# Patient Record
Sex: Male | Born: 1970
Health system: Southern US, Community
[De-identification: ages and names within clinical notes are randomized; demographics above are authoritative.]

## PROBLEM LIST (undated history)

## (undated) DIAGNOSIS — N289 Disorder of kidney and ureter, unspecified: Secondary | ICD-10-CM

## (undated) DIAGNOSIS — I10 Essential (primary) hypertension: Secondary | ICD-10-CM

## (undated) DIAGNOSIS — G5711 Meralgia paresthetica, right lower limb: Secondary | ICD-10-CM

## (undated) DIAGNOSIS — I509 Heart failure, unspecified: Secondary | ICD-10-CM

## (undated) HISTORY — PX: NO PAST SURGERIES: SHX2092

## (undated) HISTORY — DX: Meralgia paresthetica, right lower limb: G57.11

---

## 2016-01-17 ENCOUNTER — Encounter (HOSPITAL_COMMUNITY): Payer: Self-pay | Admitting: Emergency Medicine

## 2016-01-17 ENCOUNTER — Emergency Department (HOSPITAL_COMMUNITY): Payer: Medicaid Other

## 2016-01-17 ENCOUNTER — Inpatient Hospital Stay (HOSPITAL_COMMUNITY)
Admission: EM | Admit: 2016-01-17 | Discharge: 2016-01-22 | DRG: 291 | Disposition: A | Discharge status: Home / Self Care | Payer: Medicaid Other | Attending: Internal Medicine | Admitting: Internal Medicine

## 2016-01-17 DIAGNOSIS — Z87891 Personal history of nicotine dependence: Secondary | ICD-10-CM | POA: Diagnosis not present

## 2016-01-17 DIAGNOSIS — F1910 Other psychoactive substance abuse, uncomplicated: Secondary | ICD-10-CM | POA: Diagnosis not present

## 2016-01-17 DIAGNOSIS — I255 Ischemic cardiomyopathy: Secondary | ICD-10-CM | POA: Diagnosis not present

## 2016-01-17 DIAGNOSIS — I132 Hypertensive heart and chronic kidney disease with heart failure and with stage 5 chronic kidney disease, or end stage renal disease: Secondary | ICD-10-CM | POA: Diagnosis present

## 2016-01-17 DIAGNOSIS — Z6841 Body Mass Index (BMI) 40.0 and over, adult: Secondary | ICD-10-CM | POA: Diagnosis not present

## 2016-01-17 DIAGNOSIS — I5040 Unspecified combined systolic (congestive) and diastolic (congestive) heart failure: Secondary | ICD-10-CM | POA: Diagnosis present

## 2016-01-17 DIAGNOSIS — I5023 Acute on chronic systolic (congestive) heart failure: Secondary | ICD-10-CM | POA: Diagnosis not present

## 2016-01-17 DIAGNOSIS — N185 Chronic kidney disease, stage 5: Secondary | ICD-10-CM | POA: Diagnosis present

## 2016-01-17 DIAGNOSIS — Z66 Do not resuscitate: Secondary | ICD-10-CM | POA: Diagnosis present

## 2016-01-17 DIAGNOSIS — I5043 Acute on chronic combined systolic (congestive) and diastolic (congestive) heart failure: Secondary | ICD-10-CM | POA: Diagnosis present

## 2016-01-17 DIAGNOSIS — E669 Obesity, unspecified: Secondary | ICD-10-CM | POA: Diagnosis present

## 2016-01-17 DIAGNOSIS — Z515 Encounter for palliative care: Secondary | ICD-10-CM | POA: Diagnosis present

## 2016-01-17 DIAGNOSIS — Z720 Tobacco use: Secondary | ICD-10-CM

## 2016-01-17 DIAGNOSIS — D631 Anemia in chronic kidney disease: Secondary | ICD-10-CM | POA: Diagnosis present

## 2016-01-17 DIAGNOSIS — N184 Chronic kidney disease, stage 4 (severe): Secondary | ICD-10-CM | POA: Diagnosis not present

## 2016-01-17 DIAGNOSIS — N179 Acute kidney failure, unspecified: Secondary | ICD-10-CM

## 2016-01-17 DIAGNOSIS — Z789 Other specified health status: Secondary | ICD-10-CM | POA: Diagnosis not present

## 2016-01-17 DIAGNOSIS — I160 Hypertensive urgency: Secondary | ICD-10-CM | POA: Diagnosis present

## 2016-01-17 DIAGNOSIS — D509 Iron deficiency anemia, unspecified: Secondary | ICD-10-CM | POA: Diagnosis present

## 2016-01-17 DIAGNOSIS — I429 Cardiomyopathy, unspecified: Secondary | ICD-10-CM

## 2016-01-17 DIAGNOSIS — Z7189 Other specified counseling: Secondary | ICD-10-CM | POA: Diagnosis not present

## 2016-01-17 DIAGNOSIS — Z9119 Patient's noncompliance with other medical treatment and regimen: Secondary | ICD-10-CM

## 2016-01-17 DIAGNOSIS — N19 Unspecified kidney failure: Secondary | ICD-10-CM

## 2016-01-17 DIAGNOSIS — R601 Generalized edema: Secondary | ICD-10-CM | POA: Diagnosis present

## 2016-01-17 DIAGNOSIS — J45909 Unspecified asthma, uncomplicated: Secondary | ICD-10-CM | POA: Diagnosis present

## 2016-01-17 DIAGNOSIS — Z91199 Patient's noncompliance with other medical treatment and regimen due to unspecified reason: Secondary | ICD-10-CM

## 2016-01-17 HISTORY — DX: Disorder of kidney and ureter, unspecified: N28.9

## 2016-01-17 HISTORY — DX: Essential (primary) hypertension: I10

## 2016-01-17 HISTORY — DX: Heart failure, unspecified: I50.9

## 2016-01-17 LAB — SODIUM, URINE, RANDOM: Sodium, Ur: 80 mmol/L

## 2016-01-17 LAB — CBC
HCT: 30.8 % — ABNORMAL LOW (ref 39.0–52.0)
Hemoglobin: 8.8 g/dL — ABNORMAL LOW (ref 13.0–17.0)
MCH: 20.8 pg — ABNORMAL LOW (ref 26.0–34.0)
MCHC: 28.6 g/dL — AB (ref 30.0–36.0)
MCV: 72.6 fL — ABNORMAL LOW (ref 78.0–100.0)
Platelets: 243 10*3/uL (ref 150–400)
RBC: 4.24 MIL/uL (ref 4.22–5.81)
RDW: 18.6 % — AB (ref 11.5–15.5)
WBC: 6.8 10*3/uL (ref 4.0–10.5)

## 2016-01-17 LAB — BASIC METABOLIC PANEL
ANION GAP: 10 (ref 5–15)
BUN: 86 mg/dL — AB (ref 6–20)
CALCIUM: 8.5 mg/dL — AB (ref 8.9–10.3)
CO2: 19 mmol/L — ABNORMAL LOW (ref 22–32)
Chloride: 112 mmol/L — ABNORMAL HIGH (ref 101–111)
Creatinine, Ser: 11.66 mg/dL — ABNORMAL HIGH (ref 0.61–1.24)
GFR calc Af Amer: 5 mL/min — ABNORMAL LOW (ref >60)
GFR, EST NON AFRICAN AMERICAN: 5 mL/min — AB (ref >60)
GLUCOSE: 108 mg/dL — AB (ref 65–99)
Potassium: 4.2 mmol/L (ref 3.5–5.1)
SODIUM: 141 mmol/L (ref 135–145)

## 2016-01-17 LAB — IRON AND TIBC
IRON: 20 ug/dL — AB (ref 45–182)
SATURATION RATIOS: 6 % — AB (ref 17.9–39.5)
TIBC: 328 ug/dL (ref 250–450)
UIBC: 308 ug/dL

## 2016-01-17 LAB — URINE MICROSCOPIC-ADD ON: Squamous Epithelial / LPF: NONE SEEN

## 2016-01-17 LAB — FERRITIN: FERRITIN: 35 ng/mL (ref 24–336)

## 2016-01-17 LAB — URINALYSIS, ROUTINE W REFLEX MICROSCOPIC
Bilirubin Urine: NEGATIVE
GLUCOSE, UA: NEGATIVE mg/dL
Hgb urine dipstick: NEGATIVE
KETONES UR: NEGATIVE mg/dL
LEUKOCYTES UA: NEGATIVE
Nitrite: NEGATIVE
PH: 5.5 (ref 5.0–8.0)
Protein, ur: 100 mg/dL — AB
SPECIFIC GRAVITY, URINE: 1.01 (ref 1.005–1.030)

## 2016-01-17 LAB — I-STAT TROPONIN, ED: TROPONIN I, POC: 0.03 ng/mL (ref 0.00–0.08)

## 2016-01-17 LAB — RAPID URINE DRUG SCREEN, HOSP PERFORMED
Amphetamines: NOT DETECTED
BARBITURATES: NOT DETECTED
Benzodiazepines: NOT DETECTED
Cocaine: NOT DETECTED
Opiates: NOT DETECTED
Tetrahydrocannabinol: NOT DETECTED

## 2016-01-17 LAB — TROPONIN I
TROPONIN I: 0.05 ng/mL — AB (ref <0.03)
Troponin I: 0.05 ng/mL (ref <0.03)

## 2016-01-17 LAB — ETHANOL

## 2016-01-17 LAB — CREATININE, URINE, RANDOM: Creatinine, Urine: 87.51 mg/dL

## 2016-01-17 LAB — BRAIN NATRIURETIC PEPTIDE: B NATRIURETIC PEPTIDE 5: 2487.1 pg/mL — AB (ref 0.0–100.0)

## 2016-01-17 LAB — TSH: TSH: 3.216 u[IU]/mL (ref 0.350–4.500)

## 2016-01-17 MED ORDER — HEPARIN SODIUM (PORCINE) 5000 UNIT/ML IJ SOLN
5000.0000 [IU] | Freq: Three times a day (TID) | INTRAMUSCULAR | Status: DC
  Administered 2016-01-17 – 2016-01-22 (×15): 5000 [IU] via SUBCUTANEOUS
  Filled 2016-01-17 (×16): qty 1

## 2016-01-17 MED ORDER — FUROSEMIDE 10 MG/ML IJ SOLN
60.0000 mg | Freq: Once | INTRAMUSCULAR | Status: AC
  Administered 2016-01-17: 60 mg via INTRAVENOUS
  Filled 2016-01-17: qty 6

## 2016-01-17 MED ORDER — SODIUM CHLORIDE 0.9% FLUSH
3.0000 mL | Freq: Two times a day (BID) | INTRAVENOUS | Status: DC
  Administered 2016-01-17 – 2016-01-22 (×10): 3 mL via INTRAVENOUS

## 2016-01-17 MED ORDER — ACETAMINOPHEN 325 MG PO TABS: 650.0000 mg | ORAL_TABLET | ORAL | Status: DC | PRN

## 2016-01-17 MED ORDER — HYDRALAZINE HCL 20 MG/ML IJ SOLN
5.0000 mg | Freq: Four times a day (QID) | INTRAMUSCULAR | Status: DC | PRN
  Administered 2016-01-17 – 2016-01-20 (×5): 5 mg via INTRAVENOUS
  Filled 2016-01-17 (×7): qty 1

## 2016-01-17 MED ORDER — SODIUM CHLORIDE 0.9 % IV SOLN: 250.0000 mL | INTRAVENOUS | Status: DC | PRN

## 2016-01-17 MED ORDER — METOPROLOL TARTRATE 5 MG/5ML IV SOLN
5.0000 mg | Freq: Four times a day (QID) | INTRAVENOUS | Status: DC
  Administered 2016-01-17 – 2016-01-20 (×12): 5 mg via INTRAVENOUS
  Filled 2016-01-17 (×12): qty 5

## 2016-01-17 MED ORDER — ASPIRIN EC 81 MG PO TBEC
81.0000 mg | DELAYED_RELEASE_TABLET | Freq: Every day | ORAL | Status: DC
  Administered 2016-01-17 – 2016-01-22 (×6): 81 mg via ORAL
  Filled 2016-01-17 (×6): qty 1

## 2016-01-17 MED ORDER — ONDANSETRON HCL 4 MG/2ML IJ SOLN
4.0000 mg | Freq: Four times a day (QID) | INTRAMUSCULAR | Status: DC | PRN
Start: 2016-01-17 — End: 2016-01-22

## 2016-01-17 MED ORDER — SODIUM CHLORIDE 0.9% FLUSH
3.0000 mL | INTRAVENOUS | Status: DC | PRN
  Administered 2016-01-20: 3 mL via INTRAVENOUS
  Filled 2016-01-17: qty 3

## 2016-01-17 MED ORDER — FUROSEMIDE 10 MG/ML IJ SOLN
15.0000 mg/h | INTRAVENOUS | Status: DC
  Administered 2016-01-17 – 2016-01-18 (×2): 15 mg/h via INTRAVENOUS
  Filled 2016-01-17 (×4): qty 25

## 2016-01-17 NOTE — ED Provider Notes (Signed)
Emergency Department Provider Note   I have reviewed the triage vital signs and the nursing notes.   HISTORY  Chief Complaint Shortness of Breath; Leg Swelling; and Congestive Heart Failure   HPI Cameron [name] is a 45 y.o. male with PMH of CHF, unknown type, presents to the emergency department for evaluation of total body swelling including the legs, abdomen, groin. The patient is from out of town and does not have his health records with him. He cannot recall the type of congestive heart failure that he has. He is unsure of his medications at home and attempted to call his wife but could not reach her. He believes that he does take something for fluid but denies swelling to this extent in the past. He denies any chest pain or difficulty breathing. He denies fever or chills. The above symptoms have been progressively worsening over the past 7 days. He denies any exacerbating or alleviating factors.   Past Medical History:  Diagnosis Date  . CHF (congestive heart failure) ([place])   . Hypertension   . Renal disorder     There are no active problems to display for this patient.   History reviewed. No pertinent surgical history.    Allergies Review of patient's allergies indicates no known allergies.  History reviewed. No pertinent family history.  Social History Social History  Substance Use Topics  . Smoking status: Former Smoker    Packs/day: 0.10    Years: 5.00    Types: Cigarettes  . Smokeless tobacco: Never Used  . Alcohol use No    Review of Systems  Constitutional: No fever/chills Eyes: No visual changes. ENT: No sore throat. Cardiovascular: Denies chest pain. Total body swelling.  Respiratory: Denies shortness of breath. Gastrointestinal: No abdominal pain.  No nausea, no vomiting.  No diarrhea.  No constipation. Genitourinary: Negative for dysuria. Musculoskeletal: Negative for back pain. Skin: Negative for rash. Neurological: Negative for headaches,  focal weakness or numbness.  10-point ROS otherwise negative.  ____________________________________________   PHYSICAL EXAM:  VITAL SIGNS: ED Triage Vitals  Enc Vitals Group     BP 01/17/16 0810 (!) 181/106     Pulse Rate 01/17/16 0810 91     Resp 01/17/16 0810 (!) 28     Temp 01/17/16 0810 98.3 F (36.8 C)     Temp Source 01/17/16 0810 Oral     SpO2 01/17/16 0810 96 %     Weight 01/17/16 0844 227 lb 6.4 oz (103.1 kg)     Height 01/17/16 0810 5\' 3"  (1.6 m)     Pain Score 01/17/16 0810 0   Constitutional: Alert and oriented.  Eyes: Conjunctivae are normal.  Head: Atraumatic. Nose: No congestion/rhinnorhea. Mouth/Throat: Mucous membranes are moist.  Oropharynx non-erythematous. Neck: No stridor. Cardiovascular: Normal rate, regular rhythm. Good peripheral circulation. Grossly normal heart sounds.   Respiratory: Normal respiratory effort.  No retractions. Lungs with diminished sounds at the bases.  Gastrointestinal: Soft but distended with no rebound or guarding.  Genitourinary: Scrotal edema noted.  Musculoskeletal: 3+ bilateral LE edema. No gross deformities of extremities. Neurologic:  Normal speech and language. No gross focal neurologic deficits are appreciated.  Skin:  Skin is warm, dry and intact. No rash noted. Psychiatric: Mood and affect are normal. Speech and behavior are normal.  ____________________________________________   LABS (all labs ordered are listed, but only abnormal results are displayed)  Labs Reviewed  BASIC METABOLIC PANEL - Abnormal; Notable for the following:       Result  Value   Chloride 112 (*)    CO2 19 (*)    Glucose, Bld 108 (*)    BUN 86 (*)    Creatinine, Ser 11.66 (*)    Calcium 8.5 (*)    GFR calc non Af Amer 5 (*)    GFR calc Af Amer 5 (*)    All other components within normal limits  CBC - Abnormal; Notable for the following:    Hemoglobin 8.8 (*)    HCT 30.8 (*)    MCV 72.6 (*)    MCH 20.8 (*)    MCHC 28.6 (*)    RDW  18.6 (*)    All other components within normal limits  BRAIN NATRIURETIC PEPTIDE - Abnormal; Notable for the following:    B Natriuretic Peptide 2,487.1 (*)    All other components within normal limits  TROPONIN I - Abnormal; Notable for the following:    Troponin I 0.05 (*)    All other components within normal limits  TROPONIN I - Abnormal; Notable for the following:    Troponin I 0.05 (*)    All other components within normal limits  URINALYSIS, ROUTINE W REFLEX MICROSCOPIC (NOT AT [facility]) - Abnormal; Notable for the following:    Protein, ur 100 (*)    All other components within normal limits  IRON AND TIBC - Abnormal; Notable for the following:    Iron 20 (*)    Saturation Ratios 6 (*)    All other components within normal limits  URINE MICROSCOPIC-ADD ON - Abnormal; Notable for the following:    Bacteria, UA RARE (*)    All other components within normal limits  URINE RAPID DRUG SCREEN, HOSP PERFORMED  ETHANOL  TSH  CREATININE, URINE, RANDOM  SODIUM, URINE, RANDOM  FERRITIN  TROPONIN I  UREA NITROGEN, URINE  BASIC METABOLIC PANEL  MAGNESIUM  I-STAT TROPOININ, ED   ____________________________________________  EKG   EKG Interpretation  Date/Time:  Friday January 17 2016 08:14:56 EDT Ventricular Rate:  88 PR Interval:    QRS Duration: 46 QT Interval:  451 QTC Calculation: 546 R Axis:   20 Text Interpretation:  Sinus rhythm Short PR interval Biatrial enlargement Posterior infarct, acute (LCx) Lateral infarct, acute Borderline ST elevation, inferior leads Prolonged QT interval No STEMI. No prior tracings.  Confirmed by Lannie Heaps MD, Izetta Sakamoto [phone]) on 01/17/2016 8:48:07 AM       ____________________________________________  RADIOLOGY  Dg Chest Portable 1 View  Result Date: 01/17/2016 CLINICAL DATA:  Shortness of breath, epigastric pain. EXAM: PORTABLE CHEST 1 VIEW COMPARISON:  None. FINDINGS: Mild cardiomegaly. Low lung volumes. No edema or effusions. Left  retrocardiac density, atelectasis or infiltrate. IMPRESSION: Cardiomegaly. Left lower lobe atelectasis or infiltrate. Electronically Signed   By: Rolm Baptise M.D.   On: 01/17/2016 09:36    ____________________________________________   PROCEDURES  Procedure(s) performed:   Procedures  None ____________________________________________   INITIAL IMPRESSION / ASSESSMENT AND PLAN / ED COURSE  Pertinent labs & imaging results that were available during my care of the patient were reviewed by me and considered in my medical decision making (see chart for details).  Patient resents to the emergency department for evaluation of total body swelling over the last 7 days. He reports a history of congestive heart failure but does not recall the type or the severity. He is from [place]. Patient has anasarca on exam with diminished breath sounds at the bases. He is not complaining of difficulty breathing and has normal  oxygen saturation. Labs and chest x-ray are pending. EKG shows nonspecific ST changes but no STEMI criteria. Plan to initiate IV Lasix and reassess.   10:05 AM Patient with acute renal failure on labs with BNP of > 2,000 and Cr around 11. Have paged Nephrology for likely HD for volume overload.   10:21 AM Spoke with nephrology who will evaluate the patient. We'll page medicine team for admission.  Discussed with medicine team who will be down for admission.  ____________________________________________  FINAL CLINICAL IMPRESSION(S) / ED DIAGNOSES  Final diagnoses:  Anasarca  Renal failure     MEDICATIONS GIVEN DURING THIS VISIT:  Medications  furosemide (LASIX) injection 60 mg (not administered)     NEW OUTPATIENT MEDICATIONS STARTED DURING THIS VISIT:  None   Note:  This document was prepared using Dragon voice recognition software and may include unintentional dictation errors.  Nanda Quinton, MD Emergency Medicine   Margette Fast, MD 01/17/16 [phone]

## 2016-01-17 NOTE — ED Triage Notes (Signed)
Pt to ER BIB [facility] from home. Pt in for evaluation of full body edema that began 7 days prior. Pt reports significant hx of CHF. Just moved here from [place] and has not established care here. +3 pitting edema noted to bilateral lower extremities. RR labored. Pt is a/o x4. Edema noted also to abdomen, testicles, and bilateral upper extremities. Rales and wheezing noted on auscultation. VS - 183/115, HR 98, RR 28, SpO2 96% RA.

## 2016-01-17 NOTE — ED Notes (Signed)
Cameron [name], wife - [phone]. Work number. Best to reach her at.

## 2016-01-17 NOTE — Consult Note (Signed)
Reason for Consult: Acute renal failure on chronic kidney disease stage IV Referring Physician: Dia Crawford [facility])  HPI:  45 year old African-American man past medical history significant for hypertension, congestive heart failure and what appears to be progressive chronic kidney disease with hiatus in healthcare since March of this year when he moved from [place] to [place] without setting up continuity of healthcare. Back in March of this year, it appears that he was admitted to the hospital with hypertensive emergency and acute on chronic renal failure when his creatinine that previously was at a baseline of around 3.0-3.5 rose to 7.1-the patient at that time left the hospital AMA with a creatinine of 6.6 and appears to have been followed up at the cardiology clinic there. He reports that he has never followed up with nephrology although has been told that he might need dialysis. Before I saw him-he was seen by Dr. [name] to whom he voiced that "he would not do any kind of dialysis".  He was admitted for 7 day history of worsening anasarca with minimal shortness of breath. He denies any chest pain, fever or chills. He denies any cough or sputum production. He denies any dysuria, urgency, frequency, flank pain, fever or chills. He reports that he was not taking any type of diuretics. He denies any NSAIDs.  Past Medical History:  Diagnosis Date  . CHF (congestive heart failure) ([place])   . Hypertension   . Renal disorder     History reviewed. No pertinent surgical history.  History reviewed. No pertinent family history.  Social History:  reports that he has quit smoking. His smoking use included Cigarettes. He has a 0.50 pack-year smoking history. He has never used smokeless tobacco. He reports that he does not drink alcohol. His drug history is not on file.  Allergies: No Known Allergies  Medications:  Scheduled: . aspirin EC  81 mg Oral Daily  . heparin subcutaneous  5,000 Units  Subcutaneous Q8H  . metoprolol  5 mg Intravenous Q6H  . sodium chloride flush  3 mL Intravenous Q12H    BMP Latest Ref Rng & Units 01/17/2016  Glucose 65 - 99 mg/dL 108(H)  BUN 6 - 20 mg/dL 86(H)  Creatinine 0.61 - 1.24 mg/dL 11.66(H)  Sodium 135 - 145 mmol/L 141  Potassium 3.5 - 5.1 mmol/L 4.2  Chloride 101 - 111 mmol/L 112(H)  CO2 22 - 32 mmol/L 19(L)  Calcium 8.9 - 10.3 mg/dL 8.5(L)   CBC Latest Ref Rng & Units 01/17/2016  WBC 4.0 - 10.5 K/uL 6.8  Hemoglobin 13.0 - 17.0 g/dL 8.8(L)  Hematocrit 39.0 - 52.0 % 30.8(L)  Platelets 150 - 400 K/uL 243     Dg Chest Portable 1 View  Result Date: 01/17/2016 CLINICAL DATA:  Shortness of breath, epigastric pain. EXAM: PORTABLE CHEST 1 VIEW COMPARISON:  None. FINDINGS: Mild cardiomegaly. Low lung volumes. No edema or effusions. Left retrocardiac density, atelectasis or infiltrate. IMPRESSION: Cardiomegaly. Left lower lobe atelectasis or infiltrate. Electronically Signed   By: Rolm Baptise M.D.   On: 01/17/2016 09:36    Review of Systems  Constitutional: Positive for malaise/fatigue. Negative for chills and fever.  HENT: Negative for congestion and nosebleeds.   Eyes: Negative.   Respiratory: Positive for shortness of breath. Negative for cough and stridor.   Cardiovascular: Positive for orthopnea and leg swelling. Negative for chest pain and palpitations.  Gastrointestinal: Negative.   Genitourinary: Negative.   Musculoskeletal: Negative.   Skin: Negative.   Neurological: Positive for  weakness and headaches. Negative for focal weakness and seizures.  Psychiatric/Behavioral: Negative.    Blood pressure (!) 152/102, pulse 79, temperature 98.3 F (36.8 C), temperature source Oral, resp. rate 16, height 5\' 3"  (1.6 m), weight 103.1 kg (227 lb 6.4 oz), SpO2 100 %. Physical Exam  Nursing note and vitals reviewed. Constitutional: He is oriented to person, place, and time. He appears well-developed and well-nourished.  Not in distress but  visibly uncomfortable  HENT:  Head: Normocephalic and atraumatic.  Nose: Nose normal.  Mouth/Throat: Oropharynx is clear and moist.  Eyes: EOM are normal. Pupils are equal, round, and reactive to light.  Neck: Normal range of motion. Neck supple. JVD present.  JVP 12 cm  Cardiovascular: Normal rate and regular rhythm.   No murmur heard. Respiratory: Effort normal. He has no wheezes. He has rales.  Diminished breath sounds over the upper lung fields with fine rales bibasilar  GI: Bowel sounds are normal. He exhibits distension. There is no tenderness. There is no rebound and no guarding.  Musculoskeletal: He exhibits edema.  3-4+ anasarca  Neurological: He is alert and oriented to person, place, and time.  Skin: Skin is warm and dry. No erythema.  Psychiatric: He has a normal mood and affect.    Assessment/Plan: 1. Acute renal failure on chronic kidney disease stage IV versus progression: The etiology of his acute renal insufficiency is most likely from exacerbation of congestive heart failure/poor compliance and at this time the degree of reversibility is unclear. I agree with optimization of diuretic therapy in order to get him closer to the holding status while restarting oral antihypertensive agents. It would be prudent to lower his blood pressure slowly rather than quickly in order to limit renal injury. Avoid any iodinated intravenous contrast and RAS blocking agents at this time. He reports that he is not interested in any forms of dialysis whether acute or chronic and is currently DO NOT RESUSCITATE---raising several questions about his management. Started on Lasix drip 15 mg/hour by Dr. Frederico Hamman continue to monitor this and decide on need to supplement it with metolazone. 2. Anasarca: Resume diuretic therapy with low-sodium diet, appears that his antihypertensive therapies primarily from vasodilators (amlodipine/hydralazine) and this will likely need to be revised. Likely needs  chronic diuretic therapy. 3. Anemia: likely anemia of chronic kidney disease/chronic illness, recommend Hemoccult stools and checking iron studies prior to starting ESA. 4. Hypertensive urgency: Resume anti-hypertensive therapy and optimize diuretics for blood pressure control. Institute sodium restriction and re-educated patient regarding compliance.  Lauranne Beyersdorf K. 01/17/2016, 12:01 PM

## 2016-01-17 NOTE — H&P (Signed)
[facility] History and Physical  Cameron [name] ZCH:[id number] DOB: Feb 01, 1971 DOA: 01/17/2016  Referring physician: NA PCP: No primary care provider on file.   Chief Complaint: Anasarca  HPI: Cameron [name] is a 45 y.o. male PMHx (obtained from [facility] [place] [facility]) Non-adherent to his medications, HTN, Asthma, NICM, and Systolic CHF with EF 28%,NOMV, arthritis, Polysubstance abuse(Marijuana, Cocaine, Heroin, EtOH). CKD Stage IV/V(baseline Cr=7.1), Tobacco abuse   Presents to the emergency department for evaluation of total body swelling including the legs, abdomen, groin. The patient is from out of town and does not have his health records with him. He cannot recall the type of congestive heart failure that he has. He is unsure of his medications at home and attempted to call his wife but could not reach her. He believes that he does take something for fluid but denies swelling to this extent in the past. He denies any chest pain or difficulty breathing. He denies fever or chills. The above symptoms have been progressively worsening over the past 7 days. He denies any exacerbating or alleviating factors.. Patient states does not know what medications she takes has sent his wife home to obtain list of medications. Spoke at length with patient concerning the need for possible HD or CRRT,ADAMANTLY REFUSED.Counseled patient secondary to his high risk (EKG changes,CHF lifestyle) may require a cardiac catheterization, certainly a cardiac workup. Patient stated REFUSES ANY type of procedure. Stated would accept medication. Agreed to be DNR given above wishes.          Review of Systems:  Constitutional:  Anasarca, SOB HEENT:  No headaches, Difficulty swallowing,Tooth/dental problems,Sore throat,  No sneezing, itching, ear ache, nasal congestion, post nasal drip,  Cardio-vascular:  No chest pain, Orthopnea, PND, positive, anasarca, negative dizziness, palpitations    GI:  No heartburn, indigestion, abdominal pain, nausea, vomiting, diarrhea, change in bowel habits, loss of appetite  Resp:  Positive shortness of breath with exertion or at rest. No excess mucus, no productive cough, No non-productive cough, No coughing up of blood.No change in color of mucus.No wheezing.No chest wall deformity  Skin:  no rash or lesions.  GU:  no dysuria, change in color of urine, no urgency or frequency. No flank pain.  Musculoskeletal:  No joint pain or swelling. No decreased range of motion. No back pain.  Psych:  No change in mood or affect. No depression or anxiety. No memory loss.   Past Medical History:  Diagnosis Date  . CHF (congestive heart failure) ([place])   . Hypertension   . Renal disorder    Past Surgical History:  Procedure Laterality Date  . NO PAST SURGERIES     Social History:  reports that he has quit smoking. His smoking use included Cigarettes. He has a 0.50 pack-year smoking history. He has never used smokeless tobacco. He reports that he does not drink alcohol or use drugs.  No Known Allergies  History reviewed. No pertinent family history. No family history HTN, HLD, diabetes, cancer  Prior to Admission medications   Not on File     Consultants:  [facility] Nephrology Palliative care pending    Procedures/Significant Events:  None   Cultures None  Antimicrobials: None   Devices None   LINES / TUBES:  None    Continuous Infusions:  Physical Exam: Vitals:   01/17/16 1416 01/17/16 1517 01/17/16 1522 01/17/16 1804  BP: (!) 176/106 (!) 143/100  (!) 164/102  Pulse: 78 79  83  Resp:  Temp:      TempSrc:      SpO2:  100% 98% 100%  Weight:      Height:        Wt Readings from Last 3 Encounters:  01/17/16 102.9 kg (226 lb 12.8 oz)    General: A/O 4,  Positive distress secondary to anasarca unable to  Even remain in seated position for exam, positive acute respiratory distress Eyes: negative scleral  hemorrhage, negative anisocoria, positive icterus ENT: Negative Runny nose, negative gingival bleeding, Neck:  Negative scars, masses, torticollis, lymphadenopathy, JVD Lungs: Clear to auscultation bilaterally without wheezes or crackles Cardiovascular: tachycardic,Regular rhythm, positive gallop, negative murmur rub normal S1 and S2 Abdomen: negative abdominal pain, distended, positive soft, bowel sounds, no rebound, ppositive ascites, no appreciable mass Extremities: anasarca Skin: Negative rashes, lesions, ulcers Psychiatric:  Negative depression, negative anxiety, negative fatigue, negative mania  Central nervous system:  Cranial nerves II through XII intact, tongue/uvula midline, all extremities muscle strength 5/5, sensation intact throughout, negative dysarthria, negative expressive aphasia, negative receptive aphasia.        Labs on Admission:  Basic Metabolic Panel:  Recent Labs Lab 01/17/16 0829  NA 141  K 4.2  CL 112*  CO2 19*  GLUCOSE 108*  BUN 86*  CREATININE 11.66*  CALCIUM 8.5*   Liver Function Tests: No results for input(s): AST, ALT, ALKPHOS, BILITOT, PROT, ALBUMIN in the last 168 hours. No results for input(s): LIPASE, AMYLASE in the last 168 hours. No results for input(s): AMMONIA in the last 168 hours. CBC:  Recent Labs Lab 01/17/16 0829  WBC 6.8  HGB 8.8*  HCT 30.8*  MCV 72.6*  PLT 243   Cardiac Enzymes:  Recent Labs Lab 01/17/16 1142 01/17/16 1703  TROPONINI 0.05* 0.05*    BNP (last 3 results)  Recent Labs  01/17/16 0829  BNP 2,487.1*    ProBNP (last 3 results) No results for input(s): PROBNP in the last 8760 hours.  CBG: No results for input(s): GLUCAP in the last 168 hours.  Radiological Exams on Admission: Dg Chest Portable 1 View  Result Date: 01/17/2016 CLINICAL DATA:  Shortness of breath, epigastric pain. EXAM: PORTABLE CHEST 1 VIEW COMPARISON:  None. FINDINGS: Mild cardiomegaly. Low lung volumes. No edema or effusions.  Left retrocardiac density, atelectasis or infiltrate. IMPRESSION: Cardiomegaly. Left lower lobe atelectasis or infiltrate. Electronically Signed   By: Rolm Baptise M.D.   On: 01/17/2016 09:36    EKG: Abnormal EKG no previous EKG for comparison. Possible acute MI   Assessment/Plan Active Problems:   Acute on chronic systolic CHF (congestive heart failure) ([facility])   Anasarca   Acute renal failure superimposed on stage 4 chronic kidney disease ([facility])   Polysubstance abuse   Tobacco abuse   Nonischemic cardiomyopathy ([place])   Noncompliance   Non-adherent to his medications,  -Patient does not even know what medications he is post be taking. Parents states wife has returned home to obtain a list.  HTN  -since patient has expressed that he will refuse any treatment other than medication will hold on obtaining  Cardiac consult -Metoprolol IV 5 mg QID -Hold on ACEI/ARB secondary to renal failure. - Hydralazine PRN SBP> 170 or  DBP >105 -  NICM, and Systolic CHF/Acute MI  -Echocardiogram pending -with EF 35%, -see HTN , -Patient has REFUSED ALL INTERVENTIONS to include cardiac catheterization therefore cardiology not consulted  Asthma -Currently not issue  Polysubstance abuse -(Marijuana, Cocaine, Heroin, EtOH).  -UDS negative  CKD Stage IV/V(baseline Cr=7.1),  -  Nephrology consulted by  ED physician -patient states ABSOLUTELY NO CRRT/HD  Tobacco abuse - Counseled on absolute need to discontinue tobacco use however given patient has refused HD moot point. Therefore did not first issue with patient  Goals of care -Palliative care consult: Patient has adamantly refused CRRT/HD even though explained to patient by myself and nephrology would eventually lead to his death. Discuss home hospice vs inpatient hospice    Code Status: DNR (DVT Prophylaxis:subcutaneous heparin Family Communication: None  Disposition Plan: Await palliative care recommendations   Data Reviewed: Care during  the described time interval was provided by me .  I have reviewed this patient's available data, including medical history, events of note, physical examination, and all test results as part of my evaluation. I have personally reviewed and interpreted all radiology studies.  Time spent: 60 min  [place] [place] [facility] Pager [phone]

## 2016-01-18 ENCOUNTER — Inpatient Hospital Stay (HOSPITAL_COMMUNITY): Payer: Medicaid Other

## 2016-01-18 DIAGNOSIS — Z515 Encounter for palliative care: Secondary | ICD-10-CM

## 2016-01-18 DIAGNOSIS — I255 Ischemic cardiomyopathy: Secondary | ICD-10-CM

## 2016-01-18 DIAGNOSIS — N19 Unspecified kidney failure: Secondary | ICD-10-CM

## 2016-01-18 DIAGNOSIS — I5043 Acute on chronic combined systolic (congestive) and diastolic (congestive) heart failure: Secondary | ICD-10-CM

## 2016-01-18 DIAGNOSIS — Z7189 Other specified counseling: Secondary | ICD-10-CM

## 2016-01-18 LAB — BASIC METABOLIC PANEL
Anion gap: 10 (ref 5–15)
BUN: 87 mg/dL — AB (ref 6–20)
CALCIUM: 8.5 mg/dL — AB (ref 8.9–10.3)
CO2: 20 mmol/L — AB (ref 22–32)
CREATININE: 11.69 mg/dL — AB (ref 0.61–1.24)
Chloride: 109 mmol/L (ref 101–111)
GFR calc non Af Amer: 5 mL/min — ABNORMAL LOW (ref >60)
GFR, EST AFRICAN AMERICAN: 5 mL/min — AB (ref >60)
Glucose, Bld: 95 mg/dL (ref 65–99)
Potassium: 4 mmol/L (ref 3.5–5.1)
SODIUM: 139 mmol/L (ref 135–145)

## 2016-01-18 LAB — UREA NITROGEN, URINE: Urea Nitrogen, Ur: 380 mg/dL

## 2016-01-18 LAB — PHOSPHORUS: PHOSPHORUS: 6 mg/dL — AB (ref 2.5–4.6)

## 2016-01-18 LAB — MAGNESIUM: MAGNESIUM: 2.1 mg/dL (ref 1.7–2.4)

## 2016-01-18 LAB — ECHOCARDIOGRAM COMPLETE
HEIGHTINCHES: 63 in
WEIGHTICAEL: 3614.4 [oz_av]

## 2016-01-18 LAB — TROPONIN I: TROPONIN I: 0.04 ng/mL — AB (ref <0.03)

## 2016-01-18 MED ORDER — SODIUM CHLORIDE 0.9 % IV SOLN
510.0000 mg | INTRAVENOUS | Status: AC
  Administered 2016-01-18 – 2016-01-21 (×2): 510 mg via INTRAVENOUS
  Filled 2016-01-18 (×3): qty 17

## 2016-01-18 MED ORDER — CARVEDILOL 12.5 MG PO TABS
12.5000 mg | ORAL_TABLET | Freq: Two times a day (BID) | ORAL | Status: DC
  Administered 2016-01-18 (×2): 12.5 mg via ORAL
  Filled 2016-01-18 (×2): qty 1

## 2016-01-18 MED ORDER — FUROSEMIDE 10 MG/ML IJ SOLN
160.0000 mg | Freq: Three times a day (TID) | INTRAVENOUS | Status: DC
  Administered 2016-01-18 – 2016-01-21 (×10): 160 mg via INTRAVENOUS
  Filled 2016-01-18 (×12): qty 16

## 2016-01-18 MED ORDER — CARVEDILOL 12.5 MG PO TABS
12.5000 mg | ORAL_TABLET | Freq: Two times a day (BID) | ORAL | Status: DC
  Administered 2016-01-19 – 2016-01-20 (×3): 12.5 mg via ORAL
  Filled 2016-01-18 (×3): qty 1

## 2016-01-18 NOTE — Progress Notes (Signed)
PROGRESS NOTE    Cameron [name]  ZOX:[id number] DOB: 02-Sep-1970 DOA: 01/17/2016 PCP: No primary care provider on file.  Brief Narrative: 45 year old male with past medical history of hypertension, asthma, nonischemic cardiomyopathy with systolic congestive heart failure, EF of 35%, gout, polysubstance abuse including marijuana, cocaine and heroin in the past, noncompliant with the medical treatment, presented with worsening generalized body edema/anasarca. Patient was found to have advanced renal failure. Started on IV diuretics. Patient is declining renal replacement therapy or cardiac intervention.  Assessment & Plan:   # Advanced renal failure: Acute on chronic kidney disease stage V versus progressive CKD: Patient likely has progression of his underlying chronic kidney disease due to poor compliance with medical treatment and management. Urine has minimal protein. Serum creatinine level of 11.6 with acceptable serum potassium level. I will check ultrasound of kidneys to further evaluate. I discussed with the patient regarding his renal failure and possible need of renal replacement therapy including dialysis, he clearly declined dialysis treatment this time. He understand the benefit and risk including death her not receiving appropriate treatment including dialysis if needed. Nephrology consult following, appreciated. Palliative consult service is also following. -Currently on IV diuretics for anasarca. -Continue to monitor BMP,  #  Acute on chronic systolic and diastolic CHF (congestive heart failure) (HCC) and  Anasarca: Echocardiogram showed left ventricular ejection fraction of 35%. He has grade 2 diastolic dysfunction. Continue Lasix IV for fluid management. Low-salt diet and monitor daily weight. -Continue aspirin, Coreg. -Patient declined cardiac intervention including cardiac cath.   #  Polysubstance abuse: Urine tox unremarkable.    #Hypertensive urgency: Blood pressure is  suboptimally controlled. Continue Coreg and Lasix IV. Monitor blood pressure closely and avoid sudden drop in blood pressure.  #Anemia of chronic kidney disease, iron deficiency anemia: Patient has iron saturation of only 6%. Receiving IV iron treatment. Monitor hemoglobin.  DVT prophylaxis: heparin subcutaneous. Code Status: I discussed the CODE STATUS with the patient. He confirmed that he wants chest compression, CPR and even if intubation if needed. I changed his code status order to full code today. Family Communication: None present. Disposition Plan:  Likely home in 2-3 days.   Consultants:  Nephrology and palliative care.  Procedures:  Echocardiogram:Left ventricle: The cavity size was normal. There was severe   concentric hypertrophy. Systolic function was moderately reduced.   The estimated ejection fraction was in the range of 35% to 40%.   Wall motion was normal; there were no regional wall motion   abnormalities. Features are consistent with a pseudonormal left   ventricular filling pattern, with concomitant abnormal relaxation   and increased filling pressure (grade 2 diastolic dysfunction).   Doppler parameters are consistent with elevated ventricular   end-diastolic filling pressure. - Aortic valve: Valve area (VTI): 2.19 cm^2. Valve area (Vmax):   2.15 cm^2. Valve area (Vmean): 2.02 cm^2. - Aortic root: The aortic root was normal in size. - Left atrium: The atrium was normal in size. - Right ventricle: The cavity size was moderately dilated. Wall   thickness was normal. Systolic function was moderately reduced. - Right atrium: The atrium was normal in size. - Tricuspid valve: There was moderate regurgitation. - Pulmonary arteries: Systolic pressure was severely increased. PA   peak pressure: 57 mm Hg (S). - Inferior vena cava: The vessel was dilated. The respirophasic   diameter changes were blunted (< 50%), consistent with elevated   central venous pressure. -  Pericardium, extracardiac: There was no pericardial effusion.   Subjective:  Patient was seen and examined at bedside. Patient reported feeling weak, tired and worsening lower extremity edema. He denied chest pain, shortness of breath, nausea, vomiting. Denied dysuria or urgency. He wants full code. Denied dialysis or cardiac catheterization during discussion.  Objective: Vitals:   01/18/16 0028 01/18/16 0522 01/18/16 0715 01/18/16 1116  BP: (!) 172/105 (!) 170/104  (!) 172/102  Pulse: 84 88  88  Resp: 18 20    Temp: 97.7 F (36.5 C) 98.9 F (37.2 C)    TempSrc: Oral Oral    SpO2: 93% 100%  99%  Weight:   102.5 kg (225 lb 14.4 oz)   Height:        Intake/Output Summary (Last 24 hours) at 01/18/16 1635 Last data filed at 01/18/16 1300  Gross per 24 hour  Intake             1440 ml  Output             1525 ml  Net              -85 ml   Filed Weights   01/17/16 0844 01/17/16 1300 01/18/16 0715  Weight: 103.1 kg (227 lb 6.4 oz) 102.9 kg (226 lb 12.8 oz) 102.5 kg (225 lb 14.4 oz)    Examination:  General exam: Appears calm and comfortable  Respiratory system: Clear to auscultation. Respiratory effort normal. Cardiovascular system: S1 & S2 heard, RRR. No  rubs Gastrointestinal system: Soft, bowel sound positive , nontender. Anasarca Central nervous system: Alert and oriented. No focal neurological deficits. no asterixis Extremities: Symmetric 5 x 5 power. Bilateral lower extremity pitting edema+++ Skin: No rashes, lesions or ulcers     Data Reviewed: I have personally reviewed following labs and imaging studies  CBC:  Recent Labs Lab 01/17/16 0829  WBC 6.8  HGB 8.8*  HCT 30.8*  MCV 72.6*  PLT 786   Basic Metabolic Panel:  Recent Labs Lab 01/17/16 0829 01/18/16 0148 01/18/16 1010  NA 141 139  --   K 4.2 4.0  --   CL 112* 109  --   CO2 19* 20*  --   GLUCOSE 108* 95  --   BUN 86* 87*  --   CREATININE 11.66* 11.69*  --   CALCIUM 8.5* 8.5*  --   MG  --   2.1  --   PHOS  --   --  6.0*   GFR: Estimated Creatinine Clearance: 8.7 mL/min (by C-G formula based on SCr of 11.69 mg/dL (H)). Liver Function Tests: No results for input(s): AST, ALT, ALKPHOS, BILITOT, PROT, ALBUMIN in the last 168 hours. No results for input(s): LIPASE, AMYLASE in the last 168 hours. No results for input(s): AMMONIA in the last 168 hours. Coagulation Profile: No results for input(s): INR, PROTIME in the last 168 hours. Cardiac Enzymes:  Recent Labs Lab 01/17/16 1142 01/17/16 1703 01/17/16 2254  TROPONINI 0.05* 0.05* 0.04*   BNP (last 3 results) No results for input(s): PROBNP in the last 8760 hours. HbA1C: No results for input(s): HGBA1C in the last 72 hours. CBG: No results for input(s): GLUCAP in the last 168 hours. Lipid Profile: No results for input(s): CHOL, HDL, LDLCALC, TRIG, CHOLHDL, LDLDIRECT in the last 72 hours. Thyroid Function Tests:  Recent Labs  01/17/16 1142  TSH 3.216   Anemia Panel:  Recent Labs  01/17/16 1309  FERRITIN 35  TIBC 328  IRON 20*   Sepsis Labs: No results for input(s): PROCALCITON, LATICACIDVEN in the  last 168 hours.  No results found for this or any previous visit (from the past 240 hour(s)).       Radiology Studies: Dg Chest Portable 1 View  Result Date: 01/17/2016 CLINICAL DATA:  Shortness of breath, epigastric pain. EXAM: PORTABLE CHEST 1 VIEW COMPARISON:  None. FINDINGS: Mild cardiomegaly. Low lung volumes. No edema or effusions. Left retrocardiac density, atelectasis or infiltrate. IMPRESSION: Cardiomegaly. Left lower lobe atelectasis or infiltrate. Electronically Signed   By: Rolm Baptise M.D.   On: 01/17/2016 09:36        Scheduled Meds: . aspirin EC  81 mg Oral Daily  . carvedilol  12.5 mg Oral BID WC  . ferumoxytol  510 mg Intravenous Q72H  . furosemide  160 mg Intravenous TID  . heparin subcutaneous  5,000 Units Subcutaneous Q8H  . metoprolol  5 mg Intravenous Q6H  . sodium chloride  flush  3 mL Intravenous Q12H   Continuous Infusions:    LOS: 1 day    Time spent: 30 minutes    Florie Carico Tanna Furry, MD [facility] Pager [phone]  If 7PM-7AM, please contact night-coverage [email or web address] Password [facility] 01/18/2016, 4:35 PM

## 2016-01-18 NOTE — Progress Notes (Signed)
Patient ID: Cameron [name], male   DOB: 1970-06-23, 45 y.o.   MRN: [id number]  [facility] Progress Note   Assessment/ Plan:   1. Acute renal failure on chronic kidney disease stage IV versus progression: Likely with progression of his chronic kidney disease given history of poor compliance in the past along with polysubstance abuse. He may have an element of acute on chronic renal insufficiency with his current presentation with anasarca/hypertensive urgency. Ongoing medical therapy for alleviation of volume overload-begin fluid restriction of 1.2 L along with diuretic therapy. He again echoes his decision from yesterday that he would not pursue any forms of renal replacement therapy well aware that this could lead to his death. 2. Anasarca: Started on diuretic therapy with some urine output overnight however only net -0.3 L. Will discontinue Lasix drip and start bolus furosemide 160 mg 3 times a day-if remains with unimpressive urine output overnight, will add metolazone. 3. Anemia: likely anemia of chronic kidney disease/chronic illness, Iron stores severely depleted with a ferritin of 35 and iron saturation of 6%-we'll give intravenous iron. 4. Hypertensive urgency: Blood pressures remain elevated overnight-we'll start carvedilol 12.5 mg twice a day in addition to ongoing diuretic therapy and reevaluate response..  Subjective:   Denies any acute events overnight-still having significant swelling of upper and lower extremities    Objective:   BP (!) 170/104 (BP Location: Left Arm)   Pulse 88   Temp 98.9 F (37.2 C) (Oral)   Resp 20   Ht 5\' 3"  (1.6 m)   Wt 102.5 kg (225 lb 14.4 oz) Comment: c scale  SpO2 100%   BMI 40.02 kg/m   Intake/Output Summary (Last 24 hours) at 01/18/16 0944 Last data filed at 01/18/16 0844  Gross per 24 hour  Intake           1342.5 ml  Output             1650 ml  Net           -307.5 ml   Weight change:   Physical Exam: [id number]  resting in bed, getting echocardiogram CVS: Pulse regular rhythm, S1 and S2 normal Resp: Diminished breath sounds at the bases-poor inspiratory effort Abd: Soft, obese, nontender Ext: 3-4+ anasarca  Imaging: Dg Chest Portable 1 View  Result Date: 01/17/2016 CLINICAL DATA:  Shortness of breath, epigastric pain. EXAM: PORTABLE CHEST 1 VIEW COMPARISON:  None. FINDINGS: Mild cardiomegaly. Low lung volumes. No edema or effusions. Left retrocardiac density, atelectasis or infiltrate. IMPRESSION: Cardiomegaly. Left lower lobe atelectasis or infiltrate. Electronically Signed   By: Rolm Baptise M.D.   On: 01/17/2016 09:36    Labs: BMET  Recent Labs Lab 01/17/16 0829 01/18/16 0148  NA 141 139  K 4.2 4.0  CL 112* 109  CO2 19* 20*  GLUCOSE 108* 95  BUN 86* 87*  CREATININE 11.66* 11.69*  CALCIUM 8.5* 8.5*   CBC  Recent Labs Lab 01/17/16 0829  WBC 6.8  HGB 8.8*  HCT 30.8*  MCV 72.6*  PLT 243    Medications:    . aspirin EC  81 mg Oral Daily  . furosemide  160 mg Intravenous TID  . heparin subcutaneous  5,000 Units Subcutaneous Q8H  . metoprolol  5 mg Intravenous Q6H  . sodium chloride flush  3 mL Intravenous Q12H   Elmarie Shiley, MD 01/18/2016, 9:44 AM

## 2016-01-18 NOTE — Progress Notes (Signed)
*  PRELIMINARY RESULTS* Echocardiogram 2D Echocardiogram has been performed.  Beryle Beams 01/18/2016, 10:22 AM

## 2016-01-19 LAB — MAGNESIUM: Magnesium: 2.2 mg/dL (ref 1.7–2.4)

## 2016-01-19 LAB — RENAL FUNCTION PANEL
ANION GAP: 11 (ref 5–15)
Albumin: 2.8 g/dL — ABNORMAL LOW (ref 3.5–5.0)
BUN: 89 mg/dL — ABNORMAL HIGH (ref 6–20)
CHLORIDE: 109 mmol/L (ref 101–111)
CO2: 22 mmol/L (ref 22–32)
Calcium: 8.7 mg/dL — ABNORMAL LOW (ref 8.9–10.3)
Creatinine, Ser: 11.77 mg/dL — ABNORMAL HIGH (ref 0.61–1.24)
GFR calc Af Amer: 5 mL/min — ABNORMAL LOW (ref >60)
GFR calc non Af Amer: 5 mL/min — ABNORMAL LOW (ref >60)
GLUCOSE: 78 mg/dL (ref 65–99)
POTASSIUM: 4.1 mmol/L (ref 3.5–5.1)
Phosphorus: 5.8 mg/dL — ABNORMAL HIGH (ref 2.5–4.6)
SODIUM: 142 mmol/L (ref 135–145)

## 2016-01-19 MED ORDER — ISOSORBIDE MONONITRATE ER 30 MG PO TB24
30.0000 mg | ORAL_TABLET | Freq: Every day | ORAL | Status: DC
  Administered 2016-01-19 – 2016-01-20 (×2): 30 mg via ORAL
  Filled 2016-01-19 (×2): qty 1

## 2016-01-19 MED ORDER — HYDRALAZINE HCL 25 MG PO TABS
25.0000 mg | ORAL_TABLET | Freq: Three times a day (TID) | ORAL | Status: DC
  Administered 2016-01-19 – 2016-01-20 (×4): 25 mg via ORAL
  Filled 2016-01-19 (×3): qty 1

## 2016-01-19 MED ORDER — METOLAZONE 5 MG PO TABS
5.0000 mg | ORAL_TABLET | Freq: Every day | ORAL | Status: AC
  Administered 2016-01-19 – 2016-01-20 (×2): 5 mg via ORAL
  Filled 2016-01-19 (×2): qty 1

## 2016-01-19 NOTE — Consult Note (Signed)
Consultation Note Date: 01/19/2016   Patient Name: Cameron [name]  DOB: 05/31/1970  MRN: [id number]  Age / Sex: 45 y.o., male  PCP: No primary care provider on file. Referring Physician: Rosita Fire, MD  Reason for Consultation: Establishing goals of care  HPI/Patient Profile: 45 y.o. male  with past medical history of HTN, Asthma, NICM, and Systolic CHF with EF 09%,OBSJ, arthritis, Polysubstance abuse(Marijuana, Cocaine, Heroin, EtOH). CKD Stage IV/V(baseline Cr=7.1), Tobacco abuse admitted on 01/17/2016 with CHF exacerbation, renal failure, anasarca, and hypertensive urgency.  He has been adamantly refusing to consider recommended therapies including potential for dialysis.  Palliative consulted for goals of care.   Clinical Assessment and Goals of Care: I met today with Mr. Esterly.  He reports that the most important things to him are his family and living well.  He states that his doctors have been doing a good job explaining things, but he also endorses that he feels that his body is telling him something different than the doctors are telling him. We had a long discussion regarding this and Mr. Taul seems to have good knowledge regarding his current medical problems as explained to him by his doctors, however, he also expresses a distrust of Western medicine. He is clear that he does not feel that his doctors are trying to cause him harm, but he does report that he believes that the culture of medicine is to prescribe medications and procedures rather than allowing the body the opportunity to heal itself.  We talked at length about recommended medical therapies including renal replacement therapy and he continues to decline these with me as he has with every other provider who has spoken with him.  Talking with him, he has known several people on dialysis and seems to have a good understanding of the  process and commitment that it would require.  He reports understanding that refusal of recommended therapies including renal replacement therapy could lead to his death and states that he is okay with that if it is his time, however, he feels that he is going to improve with current therapies.  SUMMARY OF RECOMMENDATIONS   - Expresses desire to pursue continued aggressive care in the event of cardiac arrest or respiratory failure. He is currently a full code which is consistent with his stated wish to me today. - He expressed to me as well that he desired not to have dialysis or other invasive testing and that he will continue to refuse these if they are offered. In discussing with him further, I am concerned that he does not really grasp the severity of the situation. He states that, "I don't think that I need it right now" when discussing dialysis.  He has a very deep distrust of the medical system and stated to me a couple of times that he thinks that sometimes doctors "want to get started on stuff that you will never able to stop."  He feels that proper nutrition is essential to allowing the body to heal itself and states  that this is what he has been focusing his efforts on as an outpatient. - Recommend that his medical team continue to work to build trust and educate him on risk and benefit of any proposed procedures or medications. Will continue to ask about his desires for care moving forward as he states that he "might consider other stuff at some point in the future if I think I really need it."  He states that he will "listen to my body" when deciding what medical therapies he will pursue. - He did confirm to me today that his wife is the appropriate person to be his surrogate decision maker in the event he cannot speak for himself. He declined for me to call and talk with her further today.  Code Status/Advance Care Planning:  Full code  Palliative Prophylaxis:   Frequent Pain  Assessment  Additional Recommendations (Limitations, Scope, Preferences):  No Hemodialysis  Psycho-social/Spiritual:   Desire for further Chaplaincy support:no  Prognosis:   Unable to determine due to severity of condition. However, he is acutely ill with renal failure and at high risk for continued decompensation and death.  Discharge Planning: To Be Determined      Primary Diagnoses: Present on Admission: . Acute on chronic combined systolic and diastolic heart failure ([place]) . Anasarca . Acute renal failure superimposed on stage 4 chronic kidney disease ([place]) . Polysubstance abuse . Tobacco abuse   I have reviewed the medical record, interviewed the patient and family, and examined the patient. The following aspects are pertinent.  Past Medical History:  Diagnosis Date  . CHF (congestive heart failure) ([place])   . Hypertension   . Renal disorder    Social History   Social History  . Marital status: Married    Spouse name: N/A  . Number of children: N/A  . Years of education: N/A   Social History Main Topics  . Smoking status: Former Smoker    Packs/day: 0.10    Years: 5.00    Types: Cigarettes  . Smokeless tobacco: Never Used     Comment: quit  smoking years ago "  . Alcohol use No  . Drug use: No  . Sexual activity: Not Asked   Other Topics Concern  . None   Social History Narrative  . None   History reviewed. No pertinent family history. Scheduled Meds: . aspirin EC  81 mg Oral Daily  . carvedilol  12.5 mg Oral BID WC  . ferumoxytol  510 mg Intravenous Q72H  . furosemide  160 mg Intravenous TID  . heparin subcutaneous  5,000 Units Subcutaneous Q8H  . hydrALAZINE  25 mg Oral TID  . isosorbide mononitrate  30 mg Oral Daily  . metolazone  5 mg Oral Daily  . metoprolol  5 mg Intravenous Q6H  . sodium chloride flush  3 mL Intravenous Q12H   Continuous Infusions:  PRN Meds:.sodium chloride, acetaminophen, hydrALAZINE, ondansetron (ZOFRAN) IV,  sodium chloride flush Medications Prior to Admission:  Prior to Admission medications   Medication Sig Start Date End Date Taking? Authorizing Provider  amLODipine (NORVASC) 10 MG tablet Take 10 mg by mouth every morning.  09/16/15  Yes Historical Provider, MD  furosemide (LASIX) 40 MG tablet Take 40 mg by mouth 2 (two) times daily.   Yes Historical Provider, MD  hydrALAZINE (APRESOLINE) 100 MG tablet Take 100 mg by mouth 3 (three) times daily. 09/16/15  Yes Historical Provider, MD   No Known Allergies Review of Systems  Constitutional: Positive for  appetite change, fatigue and unexpected weight change.  Respiratory: Positive for shortness of breath.   Cardiovascular: Positive for leg swelling.  All other systems reviewed and are negative.   Physical Exam  General: Alert, awake, in no acute distress.  HEENT: No bruits, no goiter, + JVD Heart: Regular rate and rhythm. No murmur appreciated. Lungs: Good air movement, clear Abdomen: Soft, nontender, distended, positive bowel sounds.  Ext: Significant edema Skin: Warm and dry Neuro: Grossly intact, nonfocal.  Vital Signs: BP (!) 165/110 (BP Location: Left Arm) Comment: coreg given  Pulse 95   Temp 98.5 F (36.9 C) (Oral)   Resp 20   Ht _0  (1.6 m)   Wt 101.5 kg (223 lb 11.2 oz) Comment: scale c  SpO2 96%   BMI 39.63 kg/m  Pain Assessment: No/denies pain   Pain Score: 0-No pain   SpO2: SpO2: 96 % O2 Device:SpO2: 96 % O2 Flow Rate: .O2 Flow Rate (L/min): 2 L/min  IO: Intake/output summary:  Intake/Output Summary (Last 24 hours) at 01/19/16 1056 Last data filed at 01/19/16 1026  Gross per 24 hour  Intake           2192.1 ml  Output             3175 ml  Net           -982.9 ml    LBM: Last BM Date: 01/17/16 Baseline Weight: Weight: 103.1 kg (227 lb 6.4 oz) Most recent weight: Weight: 101.5 kg (223 lb 11.2 oz) (scale c)     Palliative Assessment/Data:   Flowsheet Rows   Flowsheet Row Most Recent Value  Intake  Tab  Referral Department  Hospitalist  Unit at Time of Referral  Med/Surg Unit  Palliative Care Primary Diagnosis  Nephrology  Date Notified  01/18/16  Palliative Care Type  New Palliative care  Reason for referral  Clarify Goals of Care  Date of Admission  01/17/16  Date first seen by Palliative Care  01/18/16  # of days Palliative referral response time  0 Day(s)  # of days IP prior to Palliative referral  1  Clinical Assessment  Palliative Performance Scale Score  50%  Pain Max last 24 hours  8  Pain Min Last 24 hours  4  Psychosocial & Spiritual Assessment  Palliative Care Outcomes  Patient/Family meeting held?  Yes  Who was at the meeting?  Patient      Time In: 4536 Time Out: 1520 Time Total: 60 Greater than 50%  of this time was spent counseling and coordinating care related to the above assessment and plan.  Signed by: Micheline Rough, MD   Please contact Palliative Medicine Team phone at [phone] for questions and concerns.  For individual provider: See Shea Evans

## 2016-01-19 NOTE — Progress Notes (Addendum)
Patient ID: Cameron [name], male   DOB: [birth year], 45 y.o.   MRN: [id number]  [facility] Progress Note   Assessment/ Plan:   1. Acute renal failure on chronic kidney disease stage IV versus progression: Likely with progression of his chronic kidney disease given history of poor compliance in the past along with polysubstance abuse. Excellent urine output overnight and now net -1 L-we will add metolazone to try and improve this rate of diuresis. We had a lengthy discussion and he echoes his decision to not pursue any forms of renal replacement therapy well aware that this could lead to his death. He has apparently thought about this in the past as well prior to admission. 2. Anasarca: improving urine output overnight and now net negative fluid balance, start metolazone . 3. Anemia: likely anemia of chronic kidney disease/chronic illness, status post intravenous Fereheme for iron deficiency. 4. Hypertensive urgency: Blood pressures remain elevated overnight- Given his depressed ejection fraction, will begin hydralazine 25 mg 3 times a day with isosorbide dinitrate 30 mg daily.  Subjective:   Complains of pain in both legs since yesterday morning. Reports swelling somewhat better. Remarks that he has been urinating all night.  Echocardiogram done yesterday showed LVH with EF 35-40%   Objective:   BP (!) 165/110 (BP Location: Left Arm) Comment: coreg given  Pulse 95   Temp 98.5 F (36.9 C) (Oral)   Resp 20   Ht 5\' 3"  (1.6 m)   Wt 101.5 kg (223 lb 11.2 oz) Comment: scale c  SpO2 96%   BMI 39.63 kg/m   Intake/Output Summary (Last 24 hours) at 01/19/16 0956 Last data filed at 01/19/16 0600  Gross per 24 hour  Intake           1952.1 ml  Output             3050 ml  Net          -1097.9 ml   Weight change: -0.68 kg (-1 lb 8 oz)  Physical Exam: [id number] resting in bed, Listening to sermon on YouTube CVS: Pulse regular rhythm, S1 and S2 normal Resp: Diminished breath  sounds at the bases-poor inspiratory effort Abd: Soft, obese, nontender Ext: 3-4+ anasarca  Imaging: US Renal  Result Date: 01/18/2016 CLINICAL DATA:  Renal failure. EXAM: RENAL / URINARY TRACT ULTRASOUND COMPLETE COMPARISON:  None. FINDINGS: Right Kidney: Length: 8.0 cm. Small echogenic right kidney with right renal parenchymal atrophy. No right hydronephrosis. No right renal mass. Left Kidney: Length: 9.4 cm. Small echogenic left kidney with left renal parenchymal atrophy. No left hydronephrosis. Limited visualization of a partially exophytic simple appearing 2.5 x 2.3 x 1.9 cm renal cyst in the upper left kidney. No additional left renal lesions demonstrated. Bladder: Appears normal for degree of bladder distention. Incidental bilateral pleural effusions, right greater than left. Incidental small volume abdominal ascites. IMPRESSION: 1. No hydronephrosis . 2. Small echogenic kidneys with renal parenchymal atrophy, consistent with nonspecific chronic renal parenchymal disease. 3. Limited visualization of a simple appearing left renal cyst . 4. Incidental bilateral pleural effusions (right greater than left) and small volume abdominal ascites. Electronically Signed   By: Ilona Sorrel M.D.   On: 01/18/2016 18:54    Labs: BMET  Recent Labs Lab 01/17/16 0829 01/18/16 0148 01/18/16 1010 01/19/16 0204  NA 141 139  --  142  K 4.2 4.0  --  4.1  CL 112* 109  --  109  CO2 19* 20*  --  22  GLUCOSE 108* 95  --  78  BUN 86* 87*  --  89*  CREATININE 11.66* 11.69*  --  11.77*  CALCIUM 8.5* 8.5*  --  8.7*  PHOS  --   --  6.0* 5.8*   CBC  Recent Labs Lab 01/17/16 0829  WBC 6.8  HGB 8.8*  HCT 30.8*  MCV 72.6*  PLT 243    Medications:    . aspirin EC  81 mg Oral Daily  . carvedilol  12.5 mg Oral BID WC  . ferumoxytol  510 mg Intravenous Q72H  . furosemide  160 mg Intravenous TID  . heparin subcutaneous  5,000 Units Subcutaneous Q8H  . metoprolol  5 mg Intravenous Q6H  . sodium  chloride flush  3 mL Intravenous Q12H   Elmarie Shiley, MD 01/19/2016, 9:56 AM

## 2016-01-19 NOTE — Progress Notes (Signed)
Pt a/o, no c/o pain, pt has good urine output, BP remains elevated BP meds as ordered, pt stable

## 2016-01-19 NOTE — Progress Notes (Signed)
PROGRESS NOTE    Cameron [name]  HUT:[id number] DOB: [birth year] DOA: 01/17/2016 PCP: No primary care provider on file.  Brief Narrative: 45 year old male with past medical history of hypertension, asthma, nonischemic cardiomyopathy with systolic congestive heart failure, EF of 35%, gout, polysubstance abuse including marijuana, cocaine and heroin in the past, noncompliant with the medical treatment, presented with worsening generalized body edema/anasarca. Patient was found to have advanced renal failure. Started on IV diuretics. Patient is declining renal replacement therapy or cardiac intervention.  Assessment & Plan:   # Advanced renal failure: Acute on chronic kidney disease stage V versus progressive CKD: Patient likely has progression of his underlying chronic kidney disease due to poor compliance with medical treatment and management. Urine has minimal protein. Serum creatinine level of 11.6 with acceptable serum potassium level.US kidney showed b/l atrophic kidney. I discussed with the patient regarding his renal failure and possible need of renal replacement therapy including dialysis, he clearly declined dialysis treatment this time. He understand the benefit and risk including death if not receiving appropriate treatment including dialysis, if needed. Nephrology consult following, appreciated. Palliative consult service is also following. -currently on high dose IV lasix and added metolazone today. -he has excellent urine out, stable serum creatinine level. No urgent indication for renal replacement therapy today. -Continue to monitor BMP.  #  Acute on chronic systolic and diastolic CHF (congestive heart failure) (HCC) and  Anasarca: Echocardiogram showed left ventricular ejection fraction of 35%. He has grade 2 diastolic dysfunction. Continue Lasix IV and metalazone for fluid management. Low-salt diet and monitor daily weight. -Continue aspirin, Coreg. -added hydralazine and imdur today  for depressed EF. -Patient declined cardiac intervention including cardiac cath.   #  Polysubstance abuse: Urine tox unremarkable.    #Hypertensive urgency: Blood pressure is suboptimally controlled. Continue Coreg and Lasix IV. Added imdur and hydralazine. Monitor blood pressure closely and avoid sudden drop in blood pressure.  #Anemia of chronic kidney disease, iron deficiency anemia: Patient has iron saturation of only 6%. Receiving IV iron treatment. Monitor hemoglobin.  DVT prophylaxis: heparin subcutaneous. Code Status: full code. Discussed code status with him yesterday, Family Communication: None present. Disposition Plan:  Likely home in 2-3 days.   Consultants:  Nephrology and palliative care.  Procedures:  Echocardiogram:Left ventricle: The cavity size was normal. There was severe   concentric hypertrophy. Systolic function was moderately reduced.   The estimated ejection fraction was in the range of 35% to 40%.   Wall motion was normal; there were no regional wall motion   abnormalities. Features are consistent with a pseudonormal left   ventricular filling pattern, with concomitant abnormal relaxation   and increased filling pressure (grade 2 diastolic dysfunction).   Doppler parameters are consistent with elevated ventricular   end-diastolic filling pressure. - Aortic valve: Valve area (VTI): 2.19 cm^2. Valve area (Vmax):   2.15 cm^2. Valve area (Vmean): 2.02 cm^2. - Aortic root: The aortic root was normal in size. - Left atrium: The atrium was normal in size. - Right ventricle: The cavity size was moderately dilated. Wall   thickness was normal. Systolic function was moderately reduced. - Right atrium: The atrium was normal in size. - Tricuspid valve: There was moderate regurgitation. - Pulmonary arteries: Systolic pressure was severely increased. PA   peak pressure: 57 mm Hg (S). - Inferior vena cava: The vessel was dilated. The respirophasic   diameter changes  were blunted (< 50%), consistent with elevated   central venous pressure. - Pericardium, extracardiac: There  was no pericardial effusion.   Subjective: Patient was seen and examined at bedside. Patient reported a bilateral lower extremity pain and swelling. Denied headache, dizziness, chest pain, shortness of breath, nausea or vomiting. He slept well last night. He reported good oral intake.  Objective: Vitals:   01/18/16 2005 01/18/16 2207 01/19/16 0421 01/19/16 0939  BP: (!) 169/105 (!) 184/106 (!) 178/111 (!) 165/110  Pulse: 85 86 95   Resp: 20     Temp: 97.9 F (36.6 C)  98.5 F (36.9 C)   TempSrc: Oral  Oral   SpO2: 98%  96%   Weight:   101.5 kg (223 lb 11.2 oz)   Height:        Intake/Output Summary (Last 24 hours) at 01/19/16 1122 Last data filed at 01/19/16 1026  Gross per 24 hour  Intake           2192.1 ml  Output             3175 ml  Net           -982.9 ml   Filed Weights   01/17/16 1300 01/18/16 0715 01/19/16 0421  Weight: 102.9 kg (226 lb 12.8 oz) 102.5 kg (225 lb 14.4 oz) 101.5 kg (223 lb 11.2 oz)    Examination:  General exam: Appear comfortable, not in distress.  Respiratory system: Bilateral clear to auscultation, no crackles or wheezes appreciated.. Cardiovascular system: Regular rate rhythm, S1-S2 normal, no rubs Gastrointestinal system: Abdomen soft, bowel sounds positive, nontender Central nervous system: Alert, awake and oriented. No asterixis noted. Extremities: Bilateral lower extremity pitting edema, no cough tenderness. Symmetric leg bilateral. Skin: No rashes, lesions or ulcers     Data Reviewed: I have personally reviewed following labs and imaging studies  CBC:  Recent Labs Lab 01/17/16 0829  WBC 6.8  HGB 8.8*  HCT 30.8*  MCV 72.6*  PLT 761   Basic Metabolic Panel:  Recent Labs Lab 01/17/16 0829 01/18/16 0148 01/18/16 1010 01/19/16 0204  NA 141 139  --  142  K 4.2 4.0  --  4.1  CL 112* 109  --  109  CO2 19* 20*  --   22  GLUCOSE 108* 95  --  78  BUN 86* 87*  --  89*  CREATININE 11.66* 11.69*  --  11.77*  CALCIUM 8.5* 8.5*  --  8.7*  MG  --  2.1  --  2.2  PHOS  --   --  6.0* 5.8*   GFR: Estimated Creatinine Clearance: 8.6 mL/min (by C-G formula based on SCr of 11.77 mg/dL (H)). Liver Function Tests:  Recent Labs Lab 01/19/16 0204  ALBUMIN 2.8*   No results for input(s): LIPASE, AMYLASE in the last 168 hours. No results for input(s): AMMONIA in the last 168 hours. Coagulation Profile: No results for input(s): INR, PROTIME in the last 168 hours. Cardiac Enzymes:  Recent Labs Lab 01/17/16 1142 01/17/16 1703 01/17/16 2254  TROPONINI 0.05* 0.05* 0.04*   BNP (last 3 results) No results for input(s): PROBNP in the last 8760 hours. HbA1C: No results for input(s): HGBA1C in the last 72 hours. CBG: No results for input(s): GLUCAP in the last 168 hours. Lipid Profile: No results for input(s): CHOL, HDL, LDLCALC, TRIG, CHOLHDL, LDLDIRECT in the last 72 hours. Thyroid Function Tests:  Recent Labs  01/17/16 1142  TSH 3.216   Anemia Panel:  Recent Labs  01/17/16 1309  FERRITIN 35  TIBC 328  IRON 20*   Sepsis Labs:  No results for input(s): PROCALCITON, LATICACIDVEN in the last 168 hours.  No results found for this or any previous visit (from the past 240 hour(s)).       Radiology Studies: US Renal  Result Date: 01/18/2016 CLINICAL DATA:  Renal failure. EXAM: RENAL / URINARY TRACT ULTRASOUND COMPLETE COMPARISON:  None. FINDINGS: Right Kidney: Length: 8.0 cm. Small echogenic right kidney with right renal parenchymal atrophy. No right hydronephrosis. No right renal mass. Left Kidney: Length: 9.4 cm. Small echogenic left kidney with left renal parenchymal atrophy. No left hydronephrosis. Limited visualization of a partially exophytic simple appearing 2.5 x 2.3 x 1.9 cm renal cyst in the upper left kidney. No additional left renal lesions demonstrated. Bladder: Appears normal for  degree of bladder distention. Incidental bilateral pleural effusions, right greater than left. Incidental small volume abdominal ascites. IMPRESSION: 1. No hydronephrosis . 2. Small echogenic kidneys with renal parenchymal atrophy, consistent with nonspecific chronic renal parenchymal disease. 3. Limited visualization of a simple appearing left renal cyst . 4. Incidental bilateral pleural effusions (right greater than left) and small volume abdominal ascites. Electronically Signed   By: Ilona Sorrel M.D.   On: 01/18/2016 18:54        Scheduled Meds: . aspirin EC  81 mg Oral Daily  . carvedilol  12.5 mg Oral BID WC  . ferumoxytol  510 mg Intravenous Q72H  . furosemide  160 mg Intravenous TID  . heparin subcutaneous  5,000 Units Subcutaneous Q8H  . hydrALAZINE  25 mg Oral TID  . isosorbide mononitrate  30 mg Oral Daily  . metolazone  5 mg Oral Daily  . metoprolol  5 mg Intravenous Q6H  . sodium chloride flush  3 mL Intravenous Q12H   Continuous Infusions:    LOS: 2 days    Time spent: 25 minutes    Salisha Bardsley Tanna Furry, MD [facility] Pager [phone]  If 7PM-7AM, please contact night-coverage [email or web address] Password [email or web address] 01/19/2016, 11:22 AM

## 2016-01-20 LAB — RENAL FUNCTION PANEL
Albumin: 2.9 g/dL — ABNORMAL LOW (ref 3.5–5.0)
Anion gap: 12 (ref 5–15)
BUN: 90 mg/dL — ABNORMAL HIGH (ref 6–20)
CHLORIDE: 108 mmol/L (ref 101–111)
CO2: 21 mmol/L — AB (ref 22–32)
CREATININE: 12.01 mg/dL — AB (ref 0.61–1.24)
Calcium: 8.5 mg/dL — ABNORMAL LOW (ref 8.9–10.3)
GFR calc non Af Amer: 4 mL/min — ABNORMAL LOW (ref >60)
GFR, EST AFRICAN AMERICAN: 5 mL/min — AB (ref >60)
Glucose, Bld: 78 mg/dL (ref 65–99)
POTASSIUM: 3.8 mmol/L (ref 3.5–5.1)
Phosphorus: 6.2 mg/dL — ABNORMAL HIGH (ref 2.5–4.6)
Sodium: 141 mmol/L (ref 135–145)

## 2016-01-20 LAB — MAGNESIUM: MAGNESIUM: 2 mg/dL (ref 1.7–2.4)

## 2016-01-20 MED ORDER — HYDRALAZINE HCL 50 MG PO TABS
50.0000 mg | ORAL_TABLET | Freq: Three times a day (TID) | ORAL | Status: DC
  Administered 2016-01-20 – 2016-01-21 (×3): 50 mg via ORAL
  Filled 2016-01-20 (×3): qty 1

## 2016-01-20 MED ORDER — METOLAZONE 5 MG PO TABS
5.0000 mg | ORAL_TABLET | Freq: Once | ORAL | Status: AC
  Administered 2016-01-21: 5 mg via ORAL
  Filled 2016-01-20: qty 1

## 2016-01-20 MED ORDER — ISOSORBIDE MONONITRATE ER 60 MG PO TB24
60.0000 mg | ORAL_TABLET | Freq: Every day | ORAL | Status: DC
  Administered 2016-01-21 – 2016-01-22 (×2): 60 mg via ORAL
  Filled 2016-01-20 (×2): qty 1

## 2016-01-20 MED ORDER — CALCIUM ACETATE (PHOS BINDER) 667 MG PO CAPS
667.0000 mg | ORAL_CAPSULE | Freq: Three times a day (TID) | ORAL | Status: DC
  Administered 2016-01-20 – 2016-01-22 (×6): 667 mg via ORAL
  Filled 2016-01-20 (×6): qty 1

## 2016-01-20 MED ORDER — CARVEDILOL 25 MG PO TABS
25.0000 mg | ORAL_TABLET | Freq: Two times a day (BID) | ORAL | Status: DC
  Administered 2016-01-20 – 2016-01-22 (×4): 25 mg via ORAL
  Filled 2016-01-20 (×4): qty 1

## 2016-01-20 MED ORDER — HYDRALAZINE HCL 20 MG/ML IJ SOLN
5.0000 mg | Freq: Once | INTRAMUSCULAR | Status: AC
  Administered 2016-01-20: 5 mg via INTRAVENOUS

## 2016-01-20 NOTE — Progress Notes (Signed)
Patient ID: Cameron [name], male   DOB: [birth year], 45 y.o.   MRN: [id number]  [facility] Progress Note   Subjective:   Legs remain massively swollen (despite 4 liter UOP) Renal function really no different BP remains up Denies SOB or anorexia   Objective:   BP (!) 171/111   Pulse 86   Temp 98.6 F (37 C) (Oral)   Resp 20   Ht 5\' 3"  (1.6 m)   Wt 98.2 kg (216 lb 8 oz) Comment: c scale  SpO2 98%   BMI 38.35 kg/m   Intake/Output Summary (Last 24 hours) at 01/20/16 1124 Last data filed at 01/20/16 8338  Gross per 24 hour  Intake             1401 ml  Output             3725 ml  Net            -2324 ml    Physical Exam: Comfortable resting in bed Pleasant, soft spoken NAD Pulse regular rhythm, S1 and S2 normal Diminished breath sounds at the bases-but clear Soft, obese, nontender abdomen Ext: 3-4+ anasarca - LE's very tender with palpation  Imaging: US Renal  Result Date: 01/18/2016 CLINICAL DATA:  Renal failure. EXAM: RENAL / URINARY TRACT ULTRASOUND COMPLETE COMPARISON:  None. FINDINGS: Right Kidney: Length: 8.0 cm. Small echogenic right kidney with right renal parenchymal atrophy. No right hydronephrosis. No right renal mass. Left Kidney: Length: 9.4 cm. Small echogenic left kidney with left renal parenchymal atrophy. No left hydronephrosis. Limited visualization of a partially exophytic simple appearing 2.5 x 2.3 x 1.9 cm renal cyst in the upper left kidney. No additional left renal lesions demonstrated. Bladder: Appears normal for degree of bladder distention. Incidental bilateral pleural effusions, right greater than left. Incidental small volume abdominal ascites. IMPRESSION: 1. No hydronephrosis . 2. Small echogenic kidneys with renal parenchymal atrophy, consistent with nonspecific chronic renal parenchymal disease. 3. Limited visualization of a simple appearing left renal cyst . 4. Incidental bilateral pleural effusions (right greater than left) and small  volume abdominal ascites. Electronically Signed   By: Ilona Sorrel M.D.   On: 01/18/2016 18:54    Labs:   Recent Labs Lab 01/17/16 0829 01/18/16 0148 01/18/16 1010 01/19/16 0204 01/20/16 0154  NA 141 139  --  142 141  K 4.2 4.0  --  4.1 3.8  CL 112* 109  --  109 108  CO2 19* 20*  --  22 21*  GLUCOSE 108* 95  --  78 78  BUN 86* 87*  --  89* 90*  CREATININE 11.66* 11.69*  --  11.77* 12.01*  CALCIUM 8.5* 8.5*  --  8.7* 8.5*  PHOS  --   --  6.0* 5.8* 6.2*     Recent Labs Lab 01/17/16 0829  WBC 6.8  HGB 8.8*  HCT 30.8*  MCV 72.6*  PLT 243    Medications:    . aspirin EC  81 mg Oral Daily  . carvedilol  12.5 mg Oral BID WC  . ferumoxytol  510 mg Intravenous Q72H  . furosemide  160 mg Intravenous TID  . heparin subcutaneous  5,000 Units Subcutaneous Q8H  . hydrALAZINE  25 mg Oral TID  . isosorbide mononitrate  30 mg Oral Daily  . metoprolol  5 mg Intravenous Q6H  . sodium chloride flush  3 mL Intravenous Q12H   Background 45 year old African-American man with PMH significant for HTN, NICM, systolic CHF (EF  35%), asthma, PSA (cocaine, heroin, THC) and CKD 5 with hiatus in healthcare since March of this year when he moved from [place] to [place] without setting up continuity of healthcare. From [place] records, had March 2017 hospitalization with HT emergency with AKI on CKD and creatinine increased 3.5->7.1. Left AMA with creatinine 6.6. Never had Nephrology followup but adamant during this current admission when he presented with anasarca and creatinine of 11.7 that he DOES NOT WANT ANY FORM OF RENAL REPLACEMENT THERAPY. We were asked to see and he has confirmed this with [place] at each visit.  Assessment/ Plan:    CKD5  With creatinine of 6.1 in March 2017 had CKD5 at that time ? Now has been whether acute component currently or just progression  (suspect the latter) Has adamantly refused dialysis to others and reiterates to me that "I will not do that" Excellent  urine output with lasix 160 TID and got metolazone yesterday and this AM - will redose the metolazone in the AM  There is no real change in GFR (<10) and no clinical uremic symptoms  CKD-MBD Add phos binder with meals  Anasarca: Neg fluid balance with current diuretics Redose metolazone int he AM  Anemia:  CKD + Fe def S/p Feraheme 9/16  Hypertensive urgency:  Currently carvedilol, hydralazine and isosorbide with persistent BP elevation Room to go up on the carvedilol (increased to 25) and hydralazine (increased to 50)  Of note - before he ran out of meds his hydralazine was 100 mg, carvedilol was 25, isosorbide was 60  NICM Increase isosorbide to [place], MD [place] Pager 01/20/2016, 11:43 AM

## 2016-01-20 NOTE — Progress Notes (Signed)
PROGRESS NOTE    Cameron [name]  IRS:[id number] DOB: [birth year] DOA: 01/17/2016 PCP: No primary care provider on file.  Brief Narrative: 45 year old male with past medical history of hypertension, asthma, nonischemic cardiomyopathy with systolic congestive heart failure, EF of 35%, gout, polysubstance abuse including marijuana, cocaine and heroin in the past, noncompliant with the medical treatment, presented with worsening generalized body edema/anasarca. Patient was found to have advanced renal failure. Started on IV diuretics. Patient is declining renal replacement therapy or cardiac intervention.  Assessment & Plan:   1-Acute on chronic kidney disease stage V versus progressive CKD: Patient likely has progression of his underlying chronic kidney disease .  -US kidney showed b/l atrophic kidney. -Nephrology consult following, appreciated. Palliative consult service is also following. -on IV lasix 160 mg IV TID -Repeat dose of metolazone.  -continue to decline HD.  -3 L urine out put yesterday   2-Acute on chronic systolic and diastolic CHF (congestive heart failure) (HCC) and  Anasarca: - Echocardiogram showed left ventricular ejection fraction of 35%. He has grade 2 diastolic dysfunction.  -Continue Lasix IV and metalazone for fluid management. -Continue aspirin, Coreg. -Continue with hydralazine and imdur -Patient declined cardiac intervention including cardiac cath.   3- History of Polysubstance abuse: Urine tox unremarkable.    4-Hypertensive urgency: Blood pressure is suboptimally controlled.  -Continue Coreg and Lasix IV, imdur and hydralazine.  Monitor blood pressure closely and avoid sudden drop in blood pressure.  Anemia of chronic kidney disease, iron deficiency anemia: Patient has iron saturation of only 6%. Receiving IV iron treatment. Monitor hemoglobin.  DVT prophylaxis: heparin subcutaneous. Code Status: full code. Discussed code status with him yesterday, Family  Communication: None present. Disposition Plan:  Likely home in 2-3 days.   Consultants:  Nephrology and palliative care.  Procedures:  Echocardiogram:Left ventricle: The cavity size was normal. There was severe   concentric hypertrophy. Systolic function was moderately reduced.   The estimated ejection fraction was in the range of 35% to 40%.   Wall motion was normal; there were no regional wall motion   abnormalities. Features are consistent with a pseudonormal left   ventricular filling pattern, with concomitant abnormal relaxation   and increased filling pressure (grade 2 diastolic dysfunction).   Doppler parameters are consistent with elevated ventricular   end-diastolic filling pressure. - Aortic valve: Valve area (VTI): 2.19 cm^2. Valve area (Vmax):   2.15 cm^2. Valve area (Vmean): 2.02 cm^2. - Aortic root: The aortic root was normal in size. - Left atrium: The atrium was normal in size. - Right ventricle: The cavity size was moderately dilated. Wall   thickness was normal. Systolic function was moderately reduced. - Right atrium: The atrium was normal in size. - Tricuspid valve: There was moderate regurgitation. - Pulmonary arteries: Systolic pressure was severely increased. PA   peak pressure: 57 mm Hg (S). - Inferior vena cava: The vessel was dilated. The respirophasic   diameter changes were blunted (< 50%), consistent with elevated   central venous pressure. - Pericardium, extracardiac: There was no pericardial effusion.   Subjective: Still with significant Lower extremity edema, denies nausea or decrease appetite.  Decline at this time dialysis.    Objective: Vitals:   01/20/16 0740 01/20/16 0808 01/20/16 1207 01/20/16 1208  BP: (!) 160/100 (!) 171/111 (!) 170/105 (!) 169/112  Pulse: 88 86 85   Resp: 20  18   Temp: 98.6 F (37 C)  98.3 F (36.8 C)   TempSrc: Oral  Oral   SpO2:  98%  100%   Weight: 98.2 kg (216 lb 8 oz)     Height:        Intake/Output  Summary (Last 24 hours) at 01/20/16 1600 Last data filed at 01/20/16 1529  Gross per 24 hour  Intake             1395 ml  Output             3950 ml  Net            -2555 ml   Filed Weights   01/18/16 0715 01/19/16 0421 01/20/16 0740  Weight: 102.5 kg (225 lb 14.4 oz) 101.5 kg (223 lb 11.2 oz) 98.2 kg (216 lb 8 oz)    Examination:  General exam: Appear comfortable, not in distress.  Respiratory system: Bilateral clear to auscultation, no crackles or wheezes appreciated.. Cardiovascular system: Regular rate rhythm, S1-S2 normal, no rubs Gastrointestinal system: Abdomen soft, bowel sounds positive, nontender Central nervous system: Alert, awake and oriented. No asterixis noted. Extremities: Bilateral lower extremity pitting edema, no cough tenderness. Symmetric leg bilateral. Skin: No rashes, lesions or ulcers     Data Reviewed: I have personally reviewed following labs and imaging studies  CBC:  Recent Labs Lab 01/17/16 0829  WBC 6.8  HGB 8.8*  HCT 30.8*  MCV 72.6*  PLT 299   Basic Metabolic Panel:  Recent Labs Lab 01/17/16 0829 01/18/16 0148 01/18/16 1010 01/19/16 0204 01/20/16 0154  NA 141 139  --  142 141  K 4.2 4.0  --  4.1 3.8  CL 112* 109  --  109 108  CO2 19* 20*  --  22 21*  GLUCOSE 108* 95  --  78 78  BUN 86* 87*  --  89* 90*  CREATININE 11.66* 11.69*  --  11.77* 12.01*  CALCIUM 8.5* 8.5*  --  8.7* 8.5*  MG  --  2.1  --  2.2 2.0  PHOS  --   --  6.0* 5.8* 6.2*   GFR: Estimated Creatinine Clearance: 8.2 mL/min (by C-G formula based on SCr of 12.01 mg/dL (H)). Liver Function Tests:  Recent Labs Lab 01/19/16 0204 01/20/16 0154  ALBUMIN 2.8* 2.9*   No results for input(s): LIPASE, AMYLASE in the last 168 hours. No results for input(s): AMMONIA in the last 168 hours. Coagulation Profile: No results for input(s): INR, PROTIME in the last 168 hours. Cardiac Enzymes:  Recent Labs Lab 01/17/16 1142 01/17/16 1703 01/17/16 2254  TROPONINI  0.05* 0.05* 0.04*   BNP (last 3 results) No results for input(s): PROBNP in the last 8760 hours. HbA1C: No results for input(s): HGBA1C in the last 72 hours. CBG: No results for input(s): GLUCAP in the last 168 hours. Lipid Profile: No results for input(s): CHOL, HDL, LDLCALC, TRIG, CHOLHDL, LDLDIRECT in the last 72 hours. Thyroid Function Tests: No results for input(s): TSH, T4TOTAL, FREET4, T3FREE, THYROIDAB in the last 72 hours. Anemia Panel: No results for input(s): VITAMINB12, FOLATE, FERRITIN, TIBC, IRON, RETICCTPCT in the last 72 hours. Sepsis Labs: No results for input(s): PROCALCITON, LATICACIDVEN in the last 168 hours.  No results found for this or any previous visit (from the past 240 hour(s)).       Radiology Studies: US Renal  Result Date: 01/18/2016 CLINICAL DATA:  Renal failure. EXAM: RENAL / URINARY TRACT ULTRASOUND COMPLETE COMPARISON:  None. FINDINGS: Right Kidney: Length: 8.0 cm. Small echogenic right kidney with right renal parenchymal atrophy. No right hydronephrosis. No right renal mass. Left Kidney:  Length: 9.4 cm. Small echogenic left kidney with left renal parenchymal atrophy. No left hydronephrosis. Limited visualization of a partially exophytic simple appearing 2.5 x 2.3 x 1.9 cm renal cyst in the upper left kidney. No additional left renal lesions demonstrated. Bladder: Appears normal for degree of bladder distention. Incidental bilateral pleural effusions, right greater than left. Incidental small volume abdominal ascites. IMPRESSION: 1. No hydronephrosis . 2. Small echogenic kidneys with renal parenchymal atrophy, consistent with nonspecific chronic renal parenchymal disease. 3. Limited visualization of a simple appearing left renal cyst . 4. Incidental bilateral pleural effusions (right greater than left) and small volume abdominal ascites. Electronically Signed   By: Ilona Sorrel M.D.   On: 01/18/2016 18:54        Scheduled Meds: . aspirin EC  81 mg  Oral Daily  . calcium acetate  667 mg Oral TID WC  . carvedilol  25 mg Oral BID WC  . ferumoxytol  510 mg Intravenous Q72H  . furosemide  160 mg Intravenous TID  . heparin subcutaneous  5,000 Units Subcutaneous Q8H  . hydrALAZINE  50 mg Oral TID  . [START ON 01/21/2016] isosorbide mononitrate  60 mg Oral Daily  . sodium chloride flush  3 mL Intravenous Q12H   Continuous Infusions:    LOS: 3 days    Time spent: 25 minutes    Elmarie Shiley, MD [facility] Pager [phone]  If 7PM-7AM, please contact night-coverage [email or web address] Password [email or web address] 01/20/2016, 4:00 PM

## 2016-01-20 NOTE — Care Management Note (Signed)
Case Management Note  Patient Details  Name: Cameron [name] MRN: [id number] Date of Birth: 09-11-1970  Subjective/Objective:     Admitted with CHF               Action/Plan: CM talked to patient about DCP; Patient stated that he and his wife moved her from [place] two months ago; No PCP, no medical insurance; Patient is willing to go to the [facility] for medical care; [facility]; CM talked to them about [facility] $4.00 medication list; At discharge pt can go to the [facility] to get his medication also; He has applied for Disability; CM will continue to see for DCP.  Expected Discharge Date:   possibly 01/22/2016               Expected Discharge Plan:  Home/Self Care  In-House Referral:  Financial Counselor  Discharge planning Services  CM Consult  Post Acute Care Choice:    Choice offered to:  Patient, Spouse  Status of Service:  In process, will continue to follow  Sherrilyn Rist [phone] 01/20/2016, 9:59 AM

## 2016-01-20 NOTE — Progress Notes (Signed)
Pharmacist Heart Failure Core Measure Documentation  Assessment: Cameron [name] has an EF documented as 35-40% on 01/18/16 by echo.  Rationale: Heart failure patients with left ventricular systolic dysfunction (LVSD) and an EF < 40% should be prescribed an angiotensin converting enzyme inhibitor (ACEI) or angiotensin receptor blocker (ARB) at discharge unless a contraindication is documented in the medical record.  This patient is not currently on an ACEI or ARB for HF.  This note is being placed in the record in order to provide documentation that a contraindication to the use of these agents is present for this encounter.  ACE Inhibitor or Angiotensin Receptor Blocker is contraindicated (specify all that apply)  []   ACEI allergy AND ARB allergy []   Angioedema []   Moderate or severe aortic stenosis []   Hyperkalemia []   Hypotension []   Renal artery stenosis [x]   Worsening renal function, preexisting renal disease or dysfunction   Leodis Sias T 01/20/2016 9:54 AM

## 2016-01-21 LAB — RENAL FUNCTION PANEL
ALBUMIN: 2.9 g/dL — AB (ref 3.5–5.0)
Anion gap: 12 (ref 5–15)
BUN: 91 mg/dL — AB (ref 6–20)
CO2: 23 mmol/L (ref 22–32)
CREATININE: 12.13 mg/dL — AB (ref 0.61–1.24)
Calcium: 8.7 mg/dL — ABNORMAL LOW (ref 8.9–10.3)
Chloride: 106 mmol/L (ref 101–111)
GFR calc Af Amer: 5 mL/min — ABNORMAL LOW (ref >60)
GFR, EST NON AFRICAN AMERICAN: 4 mL/min — AB (ref >60)
Glucose, Bld: 93 mg/dL (ref 65–99)
PHOSPHORUS: 5.9 mg/dL — AB (ref 2.5–4.6)
Potassium: 3.7 mmol/L (ref 3.5–5.1)
SODIUM: 141 mmol/L (ref 135–145)

## 2016-01-21 LAB — CBC
HCT: 28.7 % — ABNORMAL LOW (ref 39.0–52.0)
Hemoglobin: 8.3 g/dL — ABNORMAL LOW (ref 13.0–17.0)
MCH: 20.7 pg — ABNORMAL LOW (ref 26.0–34.0)
MCHC: 28.9 g/dL — ABNORMAL LOW (ref 30.0–36.0)
MCV: 71.6 fL — AB (ref 78.0–100.0)
PLATELETS: 202 10*3/uL (ref 150–400)
RBC: 4.01 MIL/uL — ABNORMAL LOW (ref 4.22–5.81)
RDW: 18.1 % — AB (ref 11.5–15.5)
WBC: 5.5 10*3/uL (ref 4.0–10.5)

## 2016-01-21 MED ORDER — FUROSEMIDE 80 MG PO TABS
160.0000 mg | ORAL_TABLET | Freq: Three times a day (TID) | ORAL | Status: DC
  Administered 2016-01-21 – 2016-01-22 (×3): 160 mg via ORAL
  Filled 2016-01-21 (×3): qty 2

## 2016-01-21 MED ORDER — HYDRALAZINE HCL 20 MG/ML IJ SOLN
5.0000 mg | INTRAMUSCULAR | Status: DC | PRN
  Administered 2016-01-21 – 2016-01-22 (×3): 5 mg via INTRAVENOUS
  Filled 2016-01-21 (×3): qty 1

## 2016-01-21 MED ORDER — HYDRALAZINE HCL 20 MG/ML IJ SOLN
5.0000 mg | Freq: Once | INTRAMUSCULAR | Status: AC
  Administered 2016-01-21: 5 mg via INTRAVENOUS
  Filled 2016-01-21: qty 1

## 2016-01-21 MED ORDER — HYDRALAZINE HCL 50 MG PO TABS: 75.0000 mg | ORAL_TABLET | Freq: Three times a day (TID) | ORAL | Status: DC

## 2016-01-21 MED ORDER — LABETALOL HCL 5 MG/ML IV SOLN
5.0000 mg | Freq: Once | INTRAVENOUS | Status: AC
  Administered 2016-01-21: 5 mg via INTRAVENOUS
  Filled 2016-01-21: qty 4

## 2016-01-21 MED ORDER — METOLAZONE 5 MG PO TABS
5.0000 mg | ORAL_TABLET | Freq: Every day | ORAL | Status: DC
  Administered 2016-01-21 – 2016-01-22 (×2): 5 mg via ORAL
  Filled 2016-01-21 (×2): qty 1

## 2016-01-21 MED ORDER — HYDRALAZINE HCL 50 MG PO TABS
100.0000 mg | ORAL_TABLET | Freq: Three times a day (TID) | ORAL | Status: DC
  Administered 2016-01-21 – 2016-01-22 (×3): 100 mg via ORAL
  Filled 2016-01-21 (×3): qty 2

## 2016-01-21 NOTE — Progress Notes (Signed)
Patient ID: Cameron [name], male   DOB: [birth year], 45 y.o.   MRN: [id number]  [facility] Progress Note   Subjective:    Making a small dent in his edema - continues with good UOP with 3.2 liters out yesterday He says he is ready to be discharged Once again declines renal replacement Rx No nausea or appetite change, says he feels better    Objective:   BP (!) 149/84 (BP Location: Left Arm)   Pulse 97   Temp 98.7 F (37.1 C) (Oral)   Resp 16   Ht 5\' 3"  (1.6 m)   Wt 95.8 kg (211 lb 3.2 oz)   SpO2 98%   BMI 37.41 kg/m   Intake/Output Summary (Last 24 hours) at 01/21/16 1151 Last data filed at 01/21/16 1116  Gross per 24 hour  Intake             1192 ml  Output             3250 ml  Net            -2058 ml    Physical Exam: Comfortable resting in bed Pleasant, soft spoken NAD Pulse regular rhythm, S1 and S2 normal Diminished breath sounds at the bases-but clear Soft, obese, nontender abdomen Ext: 3-4+ tight edema   Recent Labs Lab 01/17/16 0829 01/18/16 0148 01/18/16 1010 01/19/16 0204 01/20/16 0154 01/21/16 0248  NA 141 139  --  142 141 141  K 4.2 4.0  --  4.1 3.8 3.7  CL 112* 109  --  109 108 106  CO2 19* 20*  --  22 21* 23  GLUCOSE 108* 95  --  78 78 93  BUN 86* 87*  --  89* 90* 91*  CREATININE 11.66* 11.69*  --  11.77* 12.01* 12.13*  CALCIUM 8.5* 8.5*  --  8.7* 8.5* 8.7*  PHOS  --   --  6.0* 5.8* 6.2* 5.9*     Recent Labs Lab 01/17/16 0829 01/21/16 0249  WBC 6.8 5.5  HGB 8.8* 8.3*  HCT 30.8* 28.7*  MCV 72.6* 71.6*  PLT 243 202    Medications:    . aspirin EC  81 mg Oral Daily  . calcium acetate  667 mg Oral TID WC  . carvedilol  25 mg Oral BID WC  . furosemide  160 mg Intravenous TID  . heparin subcutaneous  5,000 Units Subcutaneous Q8H  . hydrALAZINE  75 mg Oral TID  . isosorbide mononitrate  60 mg Oral Daily  . sodium chloride flush  3 mL Intravenous Q12H   Background 45 year old African-American man with PMH  significant for HTN, NICM, systolic CHF (EF 62%), asthma, PSA (cocaine, heroin, THC) and CKD 5 with hiatus in healthcare since March of this year when he moved from [place] to [place] without setting up continuity of healthcare. From [place] records, had March 2017 hospitalization with HT emergency with AKI on CKD and creatinine increased 3.5->7.1. Left AMA with creatinine 6.6. Never had Nephrology followup but adamant during this current admission when he presented with anasarca and creatinine of 11.7 that he DOES NOT WANT ANY FORM OF RENAL REPLACEMENT THERAPY. We were asked to see and he has confirmed this with [place] at each visit.  Assessment/ Plan:    CKD5  With creatinine of 6.1 in March 2017 had CKD5 at that time Likely now just progression of underlying disease Has adamantly refused dialysis to others and reiterated to me that he is unwilling  again today Excellent urine output with lasix 160 TID IV plus metolazine 5 mg past 3 mornings Creatinine is in the 12's and there  is no real change in GFR (<10) and no clinical uremic symptoms Have changed diuretics to po He wants to be discharged  CKD-MBD Added phos binder with meals Ca acetate tid w/meals Anasarca: Neg fluid balance with current diuretics Continue same program  Anemia:  CKD + Fe def S/p Feraheme 9/16 Dosed Aranesp  100 X 1  Hypertensive urgency:  Currently carvedilol, hydralazine and isosorbide with persistent BP elevation Before he ran out of meds PTA his hydralazine was 100 mg - so have increased that, (carvedilol was 25, isosorbide was 60 - both now at home dose level)  NICM Increased isosorbide to [place], MD [facility] [phone] Pager 01/21/2016, 11:51 AM

## 2016-01-21 NOTE — Progress Notes (Signed)
Patient stated he was thinking about receiving dialysis. Paged nephrologist to see patient. Hermina Barters Rn 01/21/2016 4:27 PM

## 2016-01-21 NOTE — Progress Notes (Signed)
Pt. Is alert and oriented with High Blood pressure. physician  aware and change frequency of Prn hydralazine to every 4 hrs, maintained a blood of 180s/100. Attending on night shift ordered Labetalol 5mg  Iv. Patient is now trending down is 160s/90s.

## 2016-01-21 NOTE — Progress Notes (Signed)
[facility] Brief Progress Note   I came back and spent approx 20 minutes with pt (RN Hermina Barters present for the discussion) after I was called and told that pt wanted to "try dialysis just 1 time to see if he liked it".  I discussed with him that a single HD would not be adequate (nor would it be appropriate) for him to know if he "liked or disliked" dialysis and I went through the sequence with him - he would minimally require a catheter, would need to undergo serial HD treatments to work up to full treatment time of 4 hours (because of risk of disequilibrium).  A "trial" of HD would be measured in weeks to months - not in a single treatment.  He did not like the idea of "sticking a tube in him" to have dialysis.  He wanted to know if he would "feel anything" because if he "felt anything" then the treatment was bad. He was advised that some patients feel fine with HD, others experience fatigue or washed out feelings, some have BP instability.   He then proceeded to tell me that he did not even believe what he was being told about having end stage kidney disease, and that "had he not been eating boxes of baking soda" that he would not have swelled up and would not have needed to come to the hospital.  He advised [place] that he knows his body better than any doctor does, and that "we are telling him things" but that he knows better.   He said I was trying to "guilt him" into doing dialysis - I told him I was just trying to open a door that would allow him to live longer, (including possible transplantation in the future) to which he replied that I was not opening a door but closing it by trying to convince him that his kidneys were only working at 5%.  He said "you have had your say and it is my decision whether I take it and run with it or tell you to forget it".  He thanked me for talking to him.  No decisions were made.  He seems to have a general distrust of everything he is being told, and of medical  professionals  (how can somebody with something wrong with "their body" tell me there is something wrong with "my" body). He does NOT believe that his life expectancy could be measured in months. I advised that I would return in the AM for his final decision about a trial of dialysis (which would not be a single treatment - see above).  Cameron Maes, MD [facility] [phone] Pager 01/21/2016, 4:57 PM

## 2016-01-21 NOTE — Progress Notes (Signed)
PROGRESS NOTE    Cameron [name]  HDQ:[id number] DOB: [birth year] DOA: 01/17/2016 PCP: No primary care provider on file.  Brief Narrative: 45 year old male with past medical history of hypertension, asthma, nonischemic cardiomyopathy with systolic congestive heart failure, EF of 35%, gout, polysubstance abuse including marijuana, cocaine and heroin in the past, noncompliant with the medical treatment, presented with worsening generalized body edema/anasarca. Patient was found to have advanced renal failure. Started on IV diuretics. Patient is declining renal replacement therapy or cardiac intervention.  Assessment & Plan:   1-Acute on chronic kidney disease stage V versus progressive CKD: Patient likely has progression of his underlying chronic kidney disease .  -US kidney showed b/l atrophic kidney. -Nephrology consult following, appreciated. Palliative consult service is also following. -Received lasix 160 mg IV TID -Repeat dose of metolazone.  -continue to decline HD.  -negative 5 L.  He wants to be discharge, agree to stay overnight .  -lasix change to oral.  -will ask palliative to follow with patient.   2-Acute on chronic systolic and diastolic CHF (congestive heart failure) (HCC) and  Anasarca: - Echocardiogram showed left ventricular ejection fraction of 35%. He has grade 2 diastolic dysfunction.  -Continue Lasix IV and metalazone for fluid management. -Continue aspirin, Coreg. -Continue with hydralazine and imdur -Patient declined cardiac intervention including cardiac cath.   3- History of Polysubstance abuse: Urine tox unremarkable.    4-Hypertensive urgency: Blood pressure is suboptimally controlled.  -Continue Coreg and Lasix , imdur and hydralazine.  Will increase Hydralazine to 75 mg TID.   Anemia of chronic kidney disease, iron deficiency anemia: Patient has iron saturation of only 6%. Receiving IV iron treatment. Monitor hemoglobin.  DVT prophylaxis: heparin  subcutaneous. Code Status: full code.  Family Communication: None present. Disposition Plan:  Likely home in 2-3 days.   Consultants:  Nephrology and palliative care.  Procedures:  Echocardiogram:Left ventricle: The cavity size was normal. There was severe   concentric hypertrophy. Systolic function was moderately reduced.   The estimated ejection fraction was in the range of 35% to 40%.   Wall motion was normal; there were no regional wall motion   abnormalities. Features are consistent with a pseudonormal left   ventricular filling pattern, with concomitant abnormal relaxation   and increased filling pressure (grade 2 diastolic dysfunction).   Doppler parameters are consistent with elevated ventricular   end-diastolic filling pressure. - Aortic valve: Valve area (VTI): 2.19 cm^2. Valve area (Vmax):   2.15 cm^2. Valve area (Vmean): 2.02 cm^2. - Aortic root: The aortic root was normal in size. - Left atrium: The atrium was normal in size. - Right ventricle: The cavity size was moderately dilated. Wall   thickness was normal. Systolic function was moderately reduced. - Right atrium: The atrium was normal in size. - Tricuspid valve: There was moderate regurgitation. - Pulmonary arteries: Systolic pressure was severely increased. PA   peak pressure: 57 mm Hg (S). - Inferior vena cava: The vessel was dilated. The respirophasic   diameter changes were blunted (< 50%), consistent with elevated   central venous pressure. - Pericardium, extracardiac: There was no pericardial effusion.   Subjective: Denies chest pain or dyspnea.  He wants to go home.  Explain to him that he needs HD, he continue to decline.     Objective: Vitals:   01/21/16 0600 01/21/16 0838 01/21/16 0845 01/21/16 1116  BP: (!) 161/102 (!) 174/107 (!) 174/104 (!) 149/84  Pulse: 93 96  97  Resp:    16  Temp:    98.7 F (37.1 C)  TempSrc:    Oral  SpO2: 99%   98%  Weight:      Height:         Intake/Output Summary (Last 24 hours) at 01/21/16 1419 Last data filed at 01/21/16 1306  Gross per 24 hour  Intake             1112 ml  Output             3450 ml  Net            -2338 ml   Filed Weights   01/19/16 0421 01/20/16 0740 01/21/16 0445  Weight: 101.5 kg (223 lb 11.2 oz) 98.2 kg (216 lb 8 oz) 95.8 kg (211 lb 3.2 oz)    Examination:  General exam: Appear comfortable, not in distress.  Respiratory system: Bilateral clear to auscultation, no crackles or wheezes appreciated.. Cardiovascular system: Regular rate rhythm, S1-S2 normal, no rubs Gastrointestinal system: Abdomen soft, bowel sounds positive, nontender Central nervous system: Alert, awake and oriented. No asterixis noted. Extremities: Bilateral lower extremity pitting edema, no cough tenderness. Symmetric leg bilateral. Skin: No rashes, lesions or ulcers     Data Reviewed: I have personally reviewed following labs and imaging studies  CBC:  Recent Labs Lab 01/17/16 0829 01/21/16 0249  WBC 6.8 5.5  HGB 8.8* 8.3*  HCT 30.8* 28.7*  MCV 72.6* 71.6*  PLT 243 166   Basic Metabolic Panel:  Recent Labs Lab 01/17/16 0829 01/18/16 0148 01/18/16 1010 01/19/16 0204 01/20/16 0154 01/21/16 0248  NA 141 139  --  142 141 141  K 4.2 4.0  --  4.1 3.8 3.7  CL 112* 109  --  109 108 106  CO2 19* 20*  --  22 21* 23  GLUCOSE 108* 95  --  78 78 93  BUN 86* 87*  --  89* 90* 91*  CREATININE 11.66* 11.69*  --  11.77* 12.01* 12.13*  CALCIUM 8.5* 8.5*  --  8.7* 8.5* 8.7*  MG  --  2.1  --  2.2 2.0  --   PHOS  --   --  6.0* 5.8* 6.2* 5.9*   GFR: Estimated Creatinine Clearance: 8.1 mL/min (by C-G formula based on SCr of 12.13 mg/dL (H)). Liver Function Tests:  Recent Labs Lab 01/19/16 0204 01/20/16 0154 01/21/16 0248  ALBUMIN 2.8* 2.9* 2.9*   No results for input(s): LIPASE, AMYLASE in the last 168 hours. No results for input(s): AMMONIA in the last 168 hours. Coagulation Profile: No results for  input(s): INR, PROTIME in the last 168 hours. Cardiac Enzymes:  Recent Labs Lab 01/17/16 1142 01/17/16 1703 01/17/16 2254  TROPONINI 0.05* 0.05* 0.04*   BNP (last 3 results) No results for input(s): PROBNP in the last 8760 hours. HbA1C: No results for input(s): HGBA1C in the last 72 hours. CBG: No results for input(s): GLUCAP in the last 168 hours. Lipid Profile: No results for input(s): CHOL, HDL, LDLCALC, TRIG, CHOLHDL, LDLDIRECT in the last 72 hours. Thyroid Function Tests: No results for input(s): TSH, T4TOTAL, FREET4, T3FREE, THYROIDAB in the last 72 hours. Anemia Panel: No results for input(s): VITAMINB12, FOLATE, FERRITIN, TIBC, IRON, RETICCTPCT in the last 72 hours. Sepsis Labs: No results for input(s): PROCALCITON, LATICACIDVEN in the last 168 hours.  No results found for this or any previous visit (from the past 240 hour(s)).       Radiology Studies: No results found.      Scheduled Meds: .  aspirin EC  81 mg Oral Daily  . calcium acetate  667 mg Oral TID WC  . carvedilol  25 mg Oral BID WC  . furosemide  160 mg Oral TID  . heparin subcutaneous  5,000 Units Subcutaneous Q8H  . hydrALAZINE  100 mg Oral TID  . isosorbide mononitrate  60 mg Oral Daily  . metolazone  5 mg Oral Daily  . sodium chloride flush  3 mL Intravenous Q12H   Continuous Infusions:    LOS: 4 days    Time spent: 25 minutes    Elmarie Shiley, MD [facility] Pager [phone]  If 7PM-7AM, please contact night-coverage [email or web address] Password [email or web address] 01/21/2016, 2:19 PM

## 2016-01-22 DIAGNOSIS — Z789 Other specified health status: Secondary | ICD-10-CM

## 2016-01-22 DIAGNOSIS — Z515 Encounter for palliative care: Secondary | ICD-10-CM

## 2016-01-22 LAB — RENAL FUNCTION PANEL
ALBUMIN: 2.8 g/dL — AB (ref 3.5–5.0)
ANION GAP: 11 (ref 5–15)
BUN: 90 mg/dL — ABNORMAL HIGH (ref 6–20)
CALCIUM: 8.8 mg/dL — AB (ref 8.9–10.3)
CO2: 24 mmol/L (ref 22–32)
Chloride: 105 mmol/L (ref 101–111)
Creatinine, Ser: 12.65 mg/dL — ABNORMAL HIGH (ref 0.61–1.24)
GFR, EST AFRICAN AMERICAN: 5 mL/min — AB (ref >60)
GFR, EST NON AFRICAN AMERICAN: 4 mL/min — AB (ref >60)
Glucose, Bld: 80 mg/dL (ref 65–99)
PHOSPHORUS: 5.9 mg/dL — AB (ref 2.5–4.6)
POTASSIUM: 3.6 mmol/L (ref 3.5–5.1)
SODIUM: 140 mmol/L (ref 135–145)

## 2016-01-22 MED ORDER — CARVEDILOL 25 MG PO TABS: 25.0000 mg | ORAL_TABLET | Freq: Two times a day (BID) | ORAL | 1 refills | Status: DC

## 2016-01-22 MED ORDER — ASPIRIN 81 MG PO TBEC: 81.0000 mg | DELAYED_RELEASE_TABLET | Freq: Every day | ORAL | 1 refills | Status: DC

## 2016-01-22 MED ORDER — FUROSEMIDE 80 MG PO TABS: 160.0000 mg | ORAL_TABLET | Freq: Three times a day (TID) | ORAL | 1 refills | Status: DC

## 2016-01-22 MED ORDER — METOLAZONE 5 MG PO TABS: 5.0000 mg | ORAL_TABLET | Freq: Every day | ORAL | 1 refills | Status: DC

## 2016-01-22 MED ORDER — HYDRALAZINE HCL 100 MG PO TABS: 100.0000 mg | ORAL_TABLET | Freq: Three times a day (TID) | ORAL | 1 refills | Status: DC

## 2016-01-22 MED ORDER — CALCIUM ACETATE (PHOS BINDER) 667 MG PO CAPS: 667.0000 mg | ORAL_CAPSULE | Freq: Three times a day (TID) | ORAL | 1 refills | Status: DC

## 2016-01-22 MED ORDER — ISOSORBIDE MONONITRATE ER 60 MG PO TB24: 60.0000 mg | ORAL_TABLET | Freq: Every day | ORAL | 1 refills | Status: DC

## 2016-01-22 NOTE — Progress Notes (Signed)
[facility] Brief Progress Note  Still no decision about dialysis per Dr. Dyann Kief, and pt wants to go home.  I have arranged a Hospital Followup appt with me on Wednesday  01/29/16, arrive at 7:45 AM for an 8:15 AM appointment. Dialysis will be further discussed at that time.  Please advise to bring all medications to the appointment.  Jamal Maes, MD [place] Pager 01/22/2016, 1:45 PM

## 2016-01-22 NOTE — Progress Notes (Signed)
Daily Progress Note   Patient Name: Cameron [name]       Date: 01/22/2016 DOB: [birth year]  Age: 45 y.o. MRN#: [id number] Attending Physician: Barton Dubois, MD Primary Care Physician: No primary care provider on file. Admit Date: 01/17/2016  Reason for Consultation/Follow-up: Disposition and Establishing goals of care  Subjective: Patient continues to decline dialysis for Stage V CKD. Also declining cardiac interventions (CHF w/ EF 35%). Discussed disease trajectory and that his prognosis is likely weeks without HD. Offered Hospice support at home. Patient being discharged home today. Declined all interventions offered.    Length of Stay: 5  Current Medications: Scheduled Meds:  . aspirin EC  81 mg Oral Daily  . calcium acetate  667 mg Oral TID WC  . carvedilol  25 mg Oral BID WC  . furosemide  160 mg Oral TID  . heparin subcutaneous  5,000 Units Subcutaneous Q8H  . hydrALAZINE  100 mg Oral TID  . isosorbide mononitrate  60 mg Oral Daily  . metolazone  5 mg Oral Daily  . sodium chloride flush  3 mL Intravenous Q12H    Continuous Infusions:    PRN Meds: sodium chloride, acetaminophen, hydrALAZINE, ondansetron (ZOFRAN) IV, sodium chloride flush  Physical Exam         Physical Exam  Constitutional: He appears well-developed and well-nourished.  HENT:  Head: Normocephalic and atraumatic.  Cardiovascular: Normal rate.   Pulmonary/Chest: Effort normal. No respiratory distress.  Neurological: He is alert.  Psychiatric: He has a normal mood and affect.  Poor judgement and insight      Vital Signs: BP 132/83 (BP Location: Left Arm)   Pulse 87   Temp 98.3 F (36.8 C) (Oral)   Resp 20   Ht 5\' 3"  (1.6 m)   Wt 92.8 kg (204 lb 8 oz)   SpO2 97%   BMI 36.23 kg/m  SpO2: SpO2:  97 % O2 Device: O2 Device: Not Delivered O2 Flow Rate: O2 Flow Rate (L/min): 2 L/min  Intake/output summary:  Intake/Output Summary (Last 24 hours) at 01/22/16 1506 Last data filed at 01/22/16 1457  Gross per 24 hour  Intake              710 ml  Output             4010 ml  Net            -3300 ml   LBM: Last BM Date: 01/21/16 Baseline Weight: Weight: 103.1 kg (227 lb 6.4 oz) Most recent weight: Weight: 92.8 kg (204 lb 8 oz)       Palliative Assessment/Data: PPS: 90%    Flowsheet Rows   Flowsheet Row Most Recent Value  Intake Tab  Referral Department  Hospitalist  Unit at Time of Referral  Med/Surg Unit  Palliative Care Primary Diagnosis  Nephrology  Date Notified  01/18/16  Palliative Care Type  New Palliative care  Reason for referral  Clarify Goals of Care  Date of Admission  01/17/16  Date first seen by Palliative Care  01/18/16  # of days Palliative referral response time  0 Day(s)  # of days IP prior to Palliative referral  1  Clinical Assessment  Palliative Performance Scale Score  50%  Pain Max last 24 hours  8  Pain Min Last 24 hours  4  Psychosocial & Spiritual Assessment  Palliative Care Outcomes  Patient/Family meeting held?  Yes  Who was at the meeting?  Patient      Patient Active Problem List   Diagnosis Date Noted  . Renal failure   . Acute on chronic combined systolic and diastolic heart failure ([place]) 01/17/2016  . Anasarca   . Acute renal failure superimposed on stage 4 chronic kidney disease ([place])   . Polysubstance abuse   . Tobacco abuse   . Nonischemic cardiomyopathy ([place])   . Noncompliance     Palliative Care Assessment & Plan   Patient Profile:   45 y.o. male  with past medical history of HTN, Asthma, NICM, and Systolic CHFwith EF 10%,FBPZ,WCHENIDPO, Polysubstance abuse(Marijuana, Cocaine, Heroin, EtOH). CKD Stage IV/V(baseline Cr=7.1), Tobacco abuse admitted on 01/17/2016 with CHF exacerbation, renal failure, anasarca, and  hypertensive urgency.  He has been adamantly refusing to consider recommended therapies including potential for dialysis.  Palliative consulted for goals of care.   Assessment/Recommendations/Plan:  Continues to decline discussion related to intervention for CHF and Stage V CKD.   Hope for successful follow up outpatient, otherwise will continue discussion when he returns for another inevitable admission related either cardiac or renal dysfunction in the near future.  Goals of Care and Additional Recommendations:  Limitations on Scope of Treatment:   Code Status:    Code Status Orders        Start     Ordered   01/18/16 0901  Full code  Continuous     01/18/16 0900    Code Status History    Date Active Date Inactive Code Status Order ID Comments User Context   01/17/2016 11:48 AM 01/18/2016  8:59 AM DNR [id number]  Allie Bossier, MD ED   01/17/2016 11:22 AM 01/17/2016 11:48 AM Full Code [id number]  Allie Bossier, MD ED       Prognosis:   < 6 weeks based on Stage V CKD and CHF without planned interventions   Discharge Planning: Home  Care plan was discussed with patient.   Thank you for allowing the Palliative Medicine Team to assist in the care of this patient.   Time In: 1415 Time Out: 1435 Total Time 20 min Prolonged Time Billed No      Greater than 50%  of this time was spent counseling and coordinating care related to the above assessment and plan.  Mariana Kaufman, AGNP-C Palliative Medicine  Please contact Palliative Medicine Team phone at [phone] for  questions and concerns.

## 2016-01-22 NOTE — Discharge Summary (Signed)
Physician Discharge Summary  Cameron [name] EUM:[id number] DOB: Sep 30, 1970 DOA: 01/17/2016  PCP: No primary care provider on file.  Admit date: 01/17/2016 Discharge date: 01/22/2016  Time spent: 35 minutes  Recommendations for Outpatient Follow-up:  Repeat renal panel at follow up visit Reassess BP and continue adjusting antihypertensive agents as needed  Assist patient with Final decisions regarding HD treatment  Discharge Diagnoses:  Active Problems:   Acute on chronic combined systolic and diastolic heart failure (HCC)   Anasarca   Acute renal failure superimposed on stage 5 chronic kidney disease (HCC)   Polysubstance abuse   Tobacco abuse   Nonischemic cardiomyopathy ([place])   Noncompliance   Renal failure   Discharge Condition: stable. No CP, no SOB and no orthopnea. Patient discharge home with follow up appointment in 1 week with nephrology service.  Diet recommendation: heart healthy/renal diet   Filed Weights   01/20/16 0740 01/21/16 0445 01/22/16 0448  Weight: 98.2 kg (216 lb 8 oz) 95.8 kg (211 lb 3.2 oz) 92.8 kg (204 lb 8 oz)    History of present illness:  As per H&P written by Dr. Sherral [name] on 01/17/16 45 y.o. male PMHx (obtained from [facility]) Non-adherent to his medications, HTN, Asthma, NICM, and Systolic CHF with EF 54%,MGQQ, arthritis, Polysubstance abuse(Marijuana, Cocaine, Heroin, EtOH). CKD Stage IV/V(baseline Cr=7.1), Tobacco abuse   Presents to the emergency department for evaluation of total body swelling including the legs, abdomen, groin. The patient is from out of town and does not have his health records with him. He cannot recall the type of congestive heart failure that he has. He is unsure of his medications at home and attempted to call his wife but could not reach her. He believes that he does take something for fluid but denies swelling to this extent in the past. He denies any chest pain or difficulty breathing. He denies  fever or chills. The above symptoms have been progressively worsening over the past 7 days. He denies any exacerbating or alleviating factors.. Patient states does not know what medications he takes, and has sent his wife home to obtain list of medications. Spoke at length with patient concerning the need for possible HD or CRRT,ADAMANTLY REFUSED.Counseled patient secondary to his high risk (EKG changes, CHF lifestyle) may require a cardiac catheterization, certainly a cardiac workup. Patient stated REFUSES ANY type of procedure. Stated would accept medication. Agreed to be DNR given above wishes.  Hospital Course:  1-Acute on chronic kidney disease stage V at baseline.: essentially in need for HD -US kidney showed b/l atrophic kidney. -Nephrology consult appreciated. -Received lasix 160 mg IV TID with good diuresis -will discharge now base on his request; on TID lasix and metolazone -advise to follow daily weights and to follow low sodium diet -continue phoslo TID -at discharge he has continue to decline HD.   2-Acute on chronic systolic and diastolic CHF (congestive heart failure) (HCC) - Echocardiogram showed left ventricular ejection fraction of 35-40%. He has grade 2 diastolic dysfunction.  -Continue Lasix, coreg, hydralazine and Imdur -also on daily Metolazone -advise to check weight on daily basis and to follow low sodium diet  -Patient has declined cardiac intervention including cardiac cath or any other evaluation currently.   3- History of Polysubstance abuse:  -Urine tox unremarkable. -patient advise to keep himself abstinent of recreational drugs    4-Hypertensive urgency: Blood pressure is suboptimally controlled.  -Continue Coreg, lasix, Hydralazine, Imdur at current dose -patient advise to follow  low sodium diet   5-Anemia of chronic kidney disease, iron deficiency anemia:  -monitor Hgb trend with CBC at follow up -IV iron and Aranesp as per renal service discretion    Procedures: Echocardiogram:Left ventricle: The cavity size was normal. There was severe concentric hypertrophy. Systolic function was moderately reduced. The estimated ejection fraction was in the range of 35% to 40%. Wall motion was normal; there were no regional wall motion abnormalities. Features are consistent with a pseudonormal left ventricular filling pattern, with concomitant abnormal relaxation and increased filling pressure (grade 2 diastolic dysfunction). Doppler parameters are consistent with elevated ventricular end-diastolic filling pressure. - Aortic valve: Valve area (VTI): 2.19 cm^2. Valve area (Vmax): 2.15 cm^2. Valve area (Vmean): 2.02 cm^2. - Aortic root: The aortic root was normal in size. - Left atrium: The atrium was normal in size. - Right ventricle: The cavity size was moderately dilated. Wall thickness was normal. Systolic function was moderately reduced. - Right atrium: The atrium was normal in size. - Tricuspid valve: There was moderate regurgitation. - Pulmonary arteries: Systolic pressure was severely increased. PA peak pressure: 57 mm Hg (S). - Inferior vena cava: The vessel was dilated. The respirophasic diameter changes were blunted (<50%), consistent with elevated central venous pressure. - Pericardium, extracardiac: There was no pericardial effusion.   Consultations:  Renal service  Discharge Exam: Vitals:   01/22/16 1030 01/22/16 1207  BP: (!) 143/85 132/83  Pulse: 91 87  Resp:  20  Temp:  98.3 F (36.8 C)   General exam: Appear comfortable, not in distress.  Respiratory system: Bilateral clear to auscultation, no crackles or wheezes appreciated.. Cardiovascular system: Regular rate rhythm, S1-S2 normal, no rubs Gastrointestinal system: Abdomen soft, bowel sounds positive, nontender Central nervous system: Alert, awake and oriented. No asterixis noted. Extremities: Bilateral lower extremity pitting edema  (2-3+++), no cough tenderness.  Skin: No rashes, lesions or ulcers   Discharge Instructions   Discharge Instructions    (HEART FAILURE PATIENTS) Call MD:  Anytime you have any of the following symptoms: 1) 3 pound weight gain in 24 hours or 5 pounds in 1 week 2) shortness of breath, with or without a dry hacking cough 3) swelling in the hands, feet or stomach 4) if you have to sleep on extra pillows at night in order to breathe.    Complete by:  As directed    Amb Referral to HF Clinic    Complete by:  As directed    Call MD for:  difficulty breathing, headache or visual disturbances    Complete by:  As directed    Diet - low sodium heart healthy    Complete by:  As directed    Discharge instructions    Complete by:  As directed    Take medications as prescribed Follow up with nephrology as an outpatient Follow low sodium (less than 2 gram) diet and maintain adequate hydration Check weight on daily basis   Increase activity slowly    Complete by:  As directed      Current Discharge Medication List    START taking these medications   Details  aspirin EC 81 MG EC tablet Take 1 tablet (81 mg total) by mouth daily. Qty: 30 tablet, Refills: 1    calcium acetate (PHOSLO) 667 MG capsule Take 1 capsule (667 mg total) by mouth 3 (three) times daily with meals. Qty: 90 capsule, Refills: 1    carvedilol (COREG) 25 MG tablet Take 1 tablet (25 mg total) by mouth  2 (two) times daily with a meal. Qty: 60 tablet, Refills: 1    isosorbide mononitrate (IMDUR) 60 MG 24 hr tablet Take 1 tablet (60 mg total) by mouth daily. Qty: 30 tablet, Refills: 1    metolazone (ZAROXOLYN) 5 MG tablet Take 1 tablet (5 mg total) by mouth daily. Qty: 30 tablet, Refills: 1      CONTINUE these medications which have CHANGED   Details  furosemide (LASIX) 80 MG tablet Take 2 tablets (160 mg total) by mouth 3 (three) times daily. Qty: 180 tablet, Refills: 1    hydrALAZINE (APRESOLINE) 100 MG tablet Take 1  tablet (100 mg total) by mouth 3 (three) times daily. Qty: 90 tablet, Refills: 1      STOP taking these medications     amLODipine (NORVASC) 10 MG tablet        No Known Allergies Follow-up Information    [facility] Follow up on 01/27/2016.   Why:  At 10:00am for hospital follow up // Please arrive 61mins before appt and bring all medications and insurance informatiion  Contact information: [place] [id number] [phone]       Lucrezia Starch, MD. Call today.   Specialty:  Nephrology Why:  contact office for appointment details. Ask for Dr. Lorrene Reid or her Assistant  Contact information: [place] [address] [phone]           The results of significant diagnostics from this hospitalization (including imaging, microbiology, ancillary and laboratory) are listed below for reference.    Significant Diagnostic Studies: US Renal  Result Date: 01/18/2016 CLINICAL DATA:  Renal failure. EXAM: RENAL / URINARY TRACT ULTRASOUND COMPLETE COMPARISON:  None. FINDINGS: Right Kidney: Length: 8.0 cm. Small echogenic right kidney with right renal parenchymal atrophy. No right hydronephrosis. No right renal mass. Left Kidney: Length: 9.4 cm. Small echogenic left kidney with left renal parenchymal atrophy. No left hydronephrosis. Limited visualization of a partially exophytic simple appearing 2.5 x 2.3 x 1.9 cm renal cyst in the upper left kidney. No additional left renal lesions demonstrated. Bladder: Appears normal for degree of bladder distention. Incidental bilateral pleural effusions, right greater than left. Incidental small volume abdominal ascites. IMPRESSION: 1. No hydronephrosis . 2. Small echogenic kidneys with renal parenchymal atrophy, consistent with nonspecific chronic renal parenchymal disease. 3. Limited visualization of a simple appearing left renal cyst . 4. Incidental bilateral pleural  effusions (right greater than left) and small volume abdominal ascites. Electronically Signed   By: Ilona Sorrel M.D.   On: 01/18/2016 18:54   Dg Chest Portable 1 View  Result Date: 01/17/2016 CLINICAL DATA:  Shortness of breath, epigastric pain. EXAM: PORTABLE CHEST 1 VIEW COMPARISON:  None. FINDINGS: Mild cardiomegaly. Low lung volumes. No edema or effusions. Left retrocardiac density, atelectasis or infiltrate. IMPRESSION: Cardiomegaly. Left lower lobe atelectasis or infiltrate. Electronically Signed   By: Rolm Baptise M.D.   On: 01/17/2016 09:36   Labs: Basic Metabolic Panel:  Recent Labs Lab 01/18/16 0148 01/18/16 1010 01/19/16 0204 01/20/16 0154 01/21/16 0248 01/22/16 0324  NA 139  --  142 141 141 140  K 4.0  --  4.1 3.8 3.7 3.6  CL 109  --  109 108 106 105  CO2 20*  --  22 21* 23 24  GLUCOSE 95  --  78 78 93 80  BUN 87*  --  89* 90* 91* 90*  CREATININE 11.69*  --  11.77* 12.01* 12.13* 12.65*  CALCIUM 8.5*  --  8.7* 8.5* 8.7* 8.8*  MG 2.1  --  2.2 2.0  --   --   PHOS  --  6.0* 5.8* 6.2* 5.9* 5.9*   Liver Function Tests:  Recent Labs Lab 01/19/16 0204 01/20/16 0154 01/21/16 0248 01/22/16 0324  ALBUMIN 2.8* 2.9* 2.9* 2.8*   CBC:  Recent Labs Lab 01/17/16 0829 01/21/16 0249  WBC 6.8 5.5  HGB 8.8* 8.3*  HCT 30.8* 28.7*  MCV 72.6* 71.6*  PLT 243 202   Cardiac Enzymes:  Recent Labs Lab 01/17/16 1142 01/17/16 1703 01/17/16 2254  TROPONINI 0.05* 0.05* 0.04*   BNP: BNP (last 3 results)  Recent Labs  01/17/16 0829  BNP 2,487.1*    Signed:  Barton Dubois MD.  [facility] 01/22/2016, 1:31 PM

## 2016-01-22 NOTE — Progress Notes (Deleted)
Palliative Medicine Brief Progress Note  Patient continues to decline dialysis for Stage V CKD. Also declining cardiac interventions (CHF w/ EF 35%). Discussed disease trajectory and that his prognosis is likely weeks without HD. Offered Hospice support at home. Patient being discharged home today. Declined all interventions offered.   Mariana Kaufman, AGNP-C Palliative Medicine  Please call Palliative Medicine team phone with any questions [phone]. For individual providers please see AMION.

## 2016-01-22 NOTE — Progress Notes (Signed)
At 1545 d/c instructions explained and given to pt.  D/c off floor via w/c to awaiting transport.  Karie Kirks, Therapist, sports.

## 2016-01-27 ENCOUNTER — Ambulatory Visit: Payer: Self-pay

## 2016-03-30 ENCOUNTER — Inpatient Hospital Stay (HOSPITAL_COMMUNITY)
Admission: EM | Admit: 2016-03-30 | Discharge: 2016-04-07 | DRG: 682 | Disposition: A | Discharge status: Home / Self Care | Payer: Medicaid Other | Attending: Internal Medicine | Admitting: Internal Medicine

## 2016-03-30 ENCOUNTER — Inpatient Hospital Stay (HOSPITAL_COMMUNITY): Payer: Medicaid Other

## 2016-03-30 ENCOUNTER — Emergency Department (HOSPITAL_COMMUNITY): Payer: Medicaid Other

## 2016-03-30 ENCOUNTER — Encounter (HOSPITAL_COMMUNITY): Payer: Self-pay | Admitting: *Deleted

## 2016-03-30 DIAGNOSIS — Z09 Encounter for follow-up examination after completed treatment for conditions other than malignant neoplasm: Secondary | ICD-10-CM

## 2016-03-30 DIAGNOSIS — N179 Acute kidney failure, unspecified: Principal | ICD-10-CM | POA: Diagnosis present

## 2016-03-30 DIAGNOSIS — I5043 Acute on chronic combined systolic (congestive) and diastolic (congestive) heart failure: Secondary | ICD-10-CM | POA: Diagnosis present

## 2016-03-30 DIAGNOSIS — F1420 Cocaine dependence, uncomplicated: Secondary | ICD-10-CM

## 2016-03-30 DIAGNOSIS — R404 Transient alteration of awareness: Secondary | ICD-10-CM

## 2016-03-30 DIAGNOSIS — Z9114 Patient's other noncompliance with medication regimen: Secondary | ICD-10-CM | POA: Diagnosis not present

## 2016-03-30 DIAGNOSIS — I428 Other cardiomyopathies: Secondary | ICD-10-CM | POA: Diagnosis present

## 2016-03-30 DIAGNOSIS — Z87891 Personal history of nicotine dependence: Secondary | ICD-10-CM | POA: Diagnosis not present

## 2016-03-30 DIAGNOSIS — E875 Hyperkalemia: Secondary | ICD-10-CM | POA: Diagnosis present

## 2016-03-30 DIAGNOSIS — N189 Chronic kidney disease, unspecified: Secondary | ICD-10-CM

## 2016-03-30 DIAGNOSIS — I5020 Unspecified systolic (congestive) heart failure: Secondary | ICD-10-CM | POA: Diagnosis not present

## 2016-03-30 DIAGNOSIS — I5022 Chronic systolic (congestive) heart failure: Secondary | ICD-10-CM | POA: Diagnosis not present

## 2016-03-30 DIAGNOSIS — R4182 Altered mental status, unspecified: Secondary | ICD-10-CM | POA: Diagnosis present

## 2016-03-30 DIAGNOSIS — D631 Anemia in chronic kidney disease: Secondary | ICD-10-CM | POA: Diagnosis present

## 2016-03-30 DIAGNOSIS — I5040 Unspecified combined systolic (congestive) and diastolic (congestive) heart failure: Secondary | ICD-10-CM | POA: Diagnosis present

## 2016-03-30 DIAGNOSIS — Z7982 Long term (current) use of aspirin: Secondary | ICD-10-CM

## 2016-03-30 DIAGNOSIS — M109 Gout, unspecified: Secondary | ICD-10-CM | POA: Diagnosis present

## 2016-03-30 DIAGNOSIS — I10 Essential (primary) hypertension: Secondary | ICD-10-CM | POA: Diagnosis not present

## 2016-03-30 DIAGNOSIS — I132 Hypertensive heart and chronic kidney disease with heart failure and with stage 5 chronic kidney disease, or end stage renal disease: Secondary | ICD-10-CM | POA: Diagnosis present

## 2016-03-30 DIAGNOSIS — N186 End stage renal disease: Secondary | ICD-10-CM | POA: Diagnosis present

## 2016-03-30 DIAGNOSIS — H547 Unspecified visual loss: Secondary | ICD-10-CM | POA: Diagnosis present

## 2016-03-30 DIAGNOSIS — N19 Unspecified kidney failure: Secondary | ICD-10-CM | POA: Diagnosis not present

## 2016-03-30 DIAGNOSIS — Z79899 Other long term (current) drug therapy: Secondary | ICD-10-CM

## 2016-03-30 DIAGNOSIS — Z992 Dependence on renal dialysis: Secondary | ICD-10-CM | POA: Diagnosis not present

## 2016-03-30 DIAGNOSIS — E872 Acidosis: Secondary | ICD-10-CM | POA: Diagnosis present

## 2016-03-30 DIAGNOSIS — Z6841 Body Mass Index (BMI) 40.0 and over, adult: Secondary | ICD-10-CM | POA: Diagnosis not present

## 2016-03-30 DIAGNOSIS — E669 Obesity, unspecified: Secondary | ICD-10-CM | POA: Diagnosis present

## 2016-03-30 DIAGNOSIS — Z9119 Patient's noncompliance with other medical treatment and regimen: Secondary | ICD-10-CM

## 2016-03-30 DIAGNOSIS — N185 Chronic kidney disease, stage 5: Secondary | ICD-10-CM | POA: Diagnosis not present

## 2016-03-30 DIAGNOSIS — R601 Generalized edema: Secondary | ICD-10-CM

## 2016-03-30 DIAGNOSIS — Z91199 Patient's noncompliance with other medical treatment and regimen due to unspecified reason: Secondary | ICD-10-CM

## 2016-03-30 DIAGNOSIS — F1910 Other psychoactive substance abuse, uncomplicated: Secondary | ICD-10-CM | POA: Diagnosis present

## 2016-03-30 DIAGNOSIS — I509 Heart failure, unspecified: Secondary | ICD-10-CM | POA: Diagnosis not present

## 2016-03-30 DIAGNOSIS — E7220 Disorder of urea cycle metabolism, unspecified: Secondary | ICD-10-CM

## 2016-03-30 DIAGNOSIS — M10071 Idiopathic gout, right ankle and foot: Secondary | ICD-10-CM | POA: Diagnosis not present

## 2016-03-30 LAB — COMPREHENSIVE METABOLIC PANEL
ALK PHOS: 73 U/L (ref 38–126)
ALT: 14 U/L — AB (ref 17–63)
AST: 10 U/L — AB (ref 15–41)
Albumin: 3.4 g/dL — ABNORMAL LOW (ref 3.5–5.0)
Anion gap: 20 — ABNORMAL HIGH (ref 5–15)
BILIRUBIN TOTAL: 0.7 mg/dL (ref 0.3–1.2)
BUN: 198 mg/dL — AB (ref 6–20)
CALCIUM: 7.9 mg/dL — AB (ref 8.9–10.3)
CHLORIDE: 105 mmol/L (ref 101–111)
CO2: 10 mmol/L — ABNORMAL LOW (ref 22–32)
CREATININE: 21.08 mg/dL — AB (ref 0.61–1.24)
GFR, EST AFRICAN AMERICAN: 3 mL/min — AB (ref >60)
GFR, EST NON AFRICAN AMERICAN: 2 mL/min — AB (ref >60)
Glucose, Bld: 97 mg/dL (ref 65–99)
Potassium: 5.3 mmol/L — ABNORMAL HIGH (ref 3.5–5.1)
Sodium: 135 mmol/L (ref 135–145)
Total Protein: 6.5 g/dL (ref 6.5–8.1)

## 2016-03-30 LAB — I-STAT TROPONIN, ED: Troponin i, poc: 0.09 ng/mL (ref 0.00–0.08)

## 2016-03-30 LAB — I-STAT CHEM 8, ED
BUN: >140 mg/dL — ABNORMAL HIGH (ref 6–20)
CALCIUM ION: 0.95 mmol/L — AB (ref 1.15–1.40)
Chloride: 108 mmol/L (ref 101–111)
Creatinine, Ser: >18 mg/dL — ABNORMAL HIGH (ref 0.61–1.24)
GLUCOSE: 93 mg/dL (ref 65–99)
HCT: 30 % — ABNORMAL LOW (ref 39.0–52.0)
HEMOGLOBIN: 10.2 g/dL — AB (ref 13.0–17.0)
Potassium: 5.3 mmol/L — ABNORMAL HIGH (ref 3.5–5.1)
SODIUM: 134 mmol/L — AB (ref 135–145)
TCO2: 12 mmol/L (ref 0–100)

## 2016-03-30 LAB — CBC WITH DIFFERENTIAL/PLATELET
Basophils Absolute: 0 10*3/uL (ref 0.0–0.1)
Basophils Relative: 0 %
EOS ABS: 0.1 10*3/uL (ref 0.0–0.7)
EOS PCT: 2 %
HCT: 28.6 % — ABNORMAL LOW (ref 39.0–52.0)
Hemoglobin: 9.1 g/dL — ABNORMAL LOW (ref 13.0–17.0)
LYMPHS ABS: 0.6 10*3/uL — AB (ref 0.7–4.0)
Lymphocytes Relative: 10 %
MCH: 21.8 pg — AB (ref 26.0–34.0)
MCHC: 31.8 g/dL (ref 30.0–36.0)
MCV: 68.6 fL — AB (ref 78.0–100.0)
MONO ABS: 0.2 10*3/uL (ref 0.1–1.0)
Monocytes Relative: 4 %
Neutro Abs: 5.3 10*3/uL (ref 1.7–7.7)
Neutrophils Relative %: 84 %
PLATELETS: 128 10*3/uL — AB (ref 150–400)
RBC: 4.17 MIL/uL — AB (ref 4.22–5.81)
RDW: 20 % — AB (ref 11.5–15.5)
WBC: 6.2 10*3/uL (ref 4.0–10.5)

## 2016-03-30 LAB — I-STAT VENOUS BLOOD GAS, ED
Acid-base deficit: 18 mmol/L — ABNORMAL HIGH (ref 0.0–2.0)
Bicarbonate: 9.4 mmol/L — ABNORMAL LOW (ref 20.0–28.0)
O2 SAT: 90 %
PCO2 VEN: 27.1 mmHg — AB (ref 44.0–60.0)
PO2 VEN: 75 mmHg — AB (ref 32.0–45.0)
TCO2: 10 mmol/L (ref 0–100)
pH, Ven: 7.146 — CL (ref 7.250–7.430)

## 2016-03-30 LAB — BRAIN NATRIURETIC PEPTIDE: B NATRIURETIC PEPTIDE 5: 1563.1 pg/mL — AB (ref 0.0–100.0)

## 2016-03-30 LAB — AMMONIA: Ammonia: 68 umol/L — ABNORMAL HIGH (ref 9–35)

## 2016-03-30 MED ORDER — RENA-VITE PO TABS
1.0000 | ORAL_TABLET | Freq: Every day | ORAL | Status: DC
  Administered 2016-03-31 – 2016-04-06 (×7): 1 via ORAL
  Filled 2016-03-30 (×7): qty 1

## 2016-03-30 MED ORDER — ALTEPLASE 2 MG IJ SOLR: 2.0000 mg | Freq: Once | INTRAMUSCULAR | Status: DC | PRN

## 2016-03-30 MED ORDER — PENTAFLUOROPROP-TETRAFLUOROETH EX AERO
1.0000 "application " | INHALATION_SPRAY | CUTANEOUS | Status: DC | PRN
  Filled 2016-03-30: qty 30

## 2016-03-30 MED ORDER — HEPARIN SODIUM (PORCINE) 1000 UNIT/ML DIALYSIS
40.0000 [IU]/kg | Freq: Once | INTRAMUSCULAR | Status: DC
  Filled 2016-03-30: qty 5

## 2016-03-30 MED ORDER — SODIUM CHLORIDE 0.9 % IV SOLN: 100.0000 mL | INTRAVENOUS | Status: DC | PRN

## 2016-03-30 MED ORDER — FUROSEMIDE 10 MG/ML IJ SOLN
120.0000 mg | Freq: Four times a day (QID) | INTRAVENOUS | Status: DC
  Filled 2016-03-30 (×3): qty 12

## 2016-03-30 MED ORDER — CARVEDILOL 12.5 MG PO TABS
12.5000 mg | ORAL_TABLET | Freq: Two times a day (BID) | ORAL | Status: DC
  Administered 2016-03-31 (×2): 12.5 mg via ORAL
  Filled 2016-03-30 (×2): qty 1

## 2016-03-30 MED ORDER — LIDOCAINE-PRILOCAINE 2.5-2.5 % EX CREA
1.0000 "application " | TOPICAL_CREAM | CUTANEOUS | Status: DC | PRN
  Filled 2016-03-30: qty 5

## 2016-03-30 MED ORDER — HEPARIN SODIUM (PORCINE) 5000 UNIT/ML IJ SOLN
5000.0000 [IU] | Freq: Three times a day (TID) | INTRAMUSCULAR | Status: DC
  Administered 2016-04-01: 5000 [IU] via SUBCUTANEOUS
  Filled 2016-03-30 (×7): qty 1

## 2016-03-30 MED ORDER — LIDOCAINE HCL (PF) 1 % IJ SOLN: 5.0000 mL | INTRAMUSCULAR | Status: DC | PRN

## 2016-03-30 MED ORDER — METOLAZONE 5 MG PO TABS
5.0000 mg | ORAL_TABLET | Freq: Every day | ORAL | Status: DC
  Filled 2016-03-30: qty 1

## 2016-03-30 MED ORDER — HEPARIN SODIUM (PORCINE) 1000 UNIT/ML DIALYSIS
1000.0000 [IU] | INTRAMUSCULAR | Status: DC | PRN
  Filled 2016-03-30: qty 1

## 2016-03-30 MED ORDER — CALCIUM ACETATE (PHOS BINDER) 667 MG PO CAPS
2001.0000 mg | ORAL_CAPSULE | Freq: Three times a day (TID) | ORAL | Status: DC
  Administered 2016-03-31 – 2016-04-07 (×14): 2001 mg via ORAL
  Filled 2016-03-30 (×14): qty 3

## 2016-03-30 NOTE — Procedures (Signed)
Hmeodialysis Catheter Insertion Procedure Note Cameron [name] [id number] January 24, 1971  Procedure: Insertion of Hemodialysis Catheter Indications: Emergent Hemodialysis  Procedure Details Consent: Risks of procedure as well as the alternatives and risks of each were explained to the (patient/caregiver).  Consent for procedure obtained. Time Out: Verified patient identification, verified procedure, site/side was marked, verified correct patient position, special equipment/implants available, medications/allergies/relevent history reviewed, required imaging and test results available.  Performed  Maximum sterile technique was used including antiseptics, cap, gloves, gown, hand hygiene, mask and sheet. Skin prep: Chlorhexidine; local anesthetic administered A antimicrobial bonded/coated triple lumen catheter was placed in the right internal jugular vein using the Seldinger technique.  Evaluation Blood flow good Complications: No apparent complications Patient did tolerate procedure well. Chest X-ray ordered to verify placement.  CXR: pending.  Cameron [name] 03/30/2016, 5:31 PM

## 2016-03-30 NOTE — ED Notes (Signed)
Patient taken to xray.

## 2016-03-30 NOTE — ED Notes (Signed)
Pt stated he was unable to void at this time.

## 2016-03-30 NOTE — ED Notes (Signed)
Patient at xray

## 2016-03-30 NOTE — H&P (Signed)
Date: 03/30/2016               Cameron [name] MRN: [id number]  DOB: 1971-01-28 Age / Sex: 45 y.o., male   PCP: No Pcp Per Cameron         Medical Service: Internal Medicine Teaching Service         Attending Physician: Dr. Axel Filler, MD    First Contact: Dr. Holley Raring Pager: [phone]  Second Contact: Dr. Ignacia Marvel Pager: [phone]       After Hours (After 5p/  First Contact Pager: [phone]  weekends / holidays): Second Contact Pager: [phone]   Chief Complaint: SOB  History of Present Illness: Cameron [name] is a 45 y.o. male with a h/o of HTN, HFrEF (35-40%), CKD 5 who presents with 1 wk h/o worsening SOB, swelling, and reduced UOP.  Cameron presented to the ED on September 15 with complaint of anasarca. At that time he was found to have a baseline CK D stage III 5, but declined hemodialysis. He was also noted to have combined systolic and diastolic CHF, and cardiology advised catheterization to determine ischemic versus nonischemic, but the Cameron declined this procedure as well. He was successfully treated with Lasix and metolazone diuresis and was discharged on these medications.  Cameron now presents to the emergency department with similar complaints of anasarca, shortness of breath, and reduced urine output 1 week. Cameron reports that he discontinued all medications including his fluid pills roughly one week following his last hospitalization. He now reports that he has gradually started to accumulate fluid in his lower extremities and trunk, and his breathing has become more difficult and labored. He also reports a reduction in his frequency of urination, difficulty with sleeping, confusion, dysgeusia, tightness of the chest and general fatigue and malaise. Cameron denies any changes to bowel habits, but does note generalized abdominal tenderness. He denies any dysuria, fevers, chills, cough, headache. Cameron describes chest pain as tightness  of the central chest, which is not worsened with exertion and is non-pleuritic. He denies radiation.  On presentation to emergency department, he was found to have elevated serum potassium to 5.3, BUN of 198 and creatinine of 21. He was also significantly acidotic with CO2 of 10. Blood count shows stable anemia with hemoglobin of 9. Cameron has an elevated troponin, but this appears to be chronic and is likely related to his renal failure. EKG shows no ischemic changes.  Meds: Current Facility-Administered Medications  Medication Dose Route Frequency Provider Last Rate Last Dose  . furosemide (LASIX) 120 mg in dextrose 5 % 50 mL IVPB  120 mg Intravenous Q6H Collier Salina, MD      . heparin injection 5,000 Units  5,000 Units Subcutaneous Q8H Collier Salina, MD      . metolazone (ZAROXOLYN) tablet 5 mg  5 mg Oral Daily Collier Salina, MD       Current Outpatient Prescriptions  Medication Sig Dispense Refill  . aspirin EC 81 MG EC tablet Take 1 tablet (81 mg total) by mouth daily. 30 tablet 1  . furosemide (LASIX) 80 MG tablet Take 2 tablets (160 mg total) by mouth 3 (three) times daily. 180 tablet 1  . hydrALAZINE (APRESOLINE) 100 MG tablet Take 1 tablet (100 mg total) by mouth 3 (three) times daily. 90 tablet 1  . calcium acetate (PHOSLO) 667 MG capsule Take 1 capsule (667 mg total) by mouth 3 (three) times daily with meals.  90 capsule 1  . carvedilol (COREG) 25 MG tablet Take 1 tablet (25 mg total) by mouth 2 (two) times daily with a meal. 60 tablet 1  . isosorbide mononitrate (IMDUR) 60 MG 24 hr tablet Take 1 tablet (60 mg total) by mouth daily. 30 tablet 1  . metolazone (ZAROXOLYN) 5 MG tablet Take 1 tablet (5 mg total) by mouth daily. 30 tablet 1    (Not in a hospital admission) Allergies: Allergies as of 03/30/2016  . (No Known Allergies)   Past Medical History:  Diagnosis Date  . CHF (congestive heart failure) ([place])   . Hypertension   . Renal disorder     Family  History: No family h/o renal disease or heart disease.  Social History: Pt currently denies tobacco, alcohol, drug use. H/o cocaine use. Daughter at the bedside during interview.  Review of Systems: A complete ROS was negative except as per HPI. Review of Systems  Constitutional: Positive for malaise/fatigue. Negative for chills, fever and weight loss.  Eyes: Negative for blurred vision.  Respiratory: Positive for shortness of breath. Negative for cough.   Cardiovascular: Positive for chest pain and leg swelling.  Gastrointestinal: Negative for abdominal pain, blood in stool, constipation, diarrhea, nausea and vomiting.  Genitourinary: Positive for frequency (Decreased). Negative for dysuria, hematuria and urgency.  Musculoskeletal: Negative for myalgias.  Skin: Negative for itching and rash.  Neurological: Negative for dizziness, tremors and headaches.  Endo/Heme/Allergies: Negative for polydipsia.  Psychiatric/Behavioral: Positive for substance abuse (h/o cocaine). The Cameron is not nervous/anxious.     Physical Exam: Vitals:   03/30/16 1500 03/30/16 1515 03/30/16 1530 03/30/16 1545  BP: (!) 148/102 (!) 149/110  154/100  Pulse: 69 72 61 71  Resp: 16 10 22 18   Temp:      TempSrc:      SpO2: 98% 98% 100% 98%  Weight:      Height:       Physical Exam  Constitutional: He is oriented to person, place, and time. He appears lethargic. He has a sickly appearance. No distress.  HENT:  Head: Normocephalic and atraumatic.  Right Ear: Hearing normal.  Left Ear: Hearing normal.  Nose: Nose normal.  Mouth/Throat: Mucous membranes are dry.  Eyes: Conjunctivae are normal.  Cardiovascular: Normal rate, regular rhythm, S1 normal, S2 normal and intact distal pulses.  Exam reveals no gallop.   No murmur heard. Pulmonary/Chest: Effort normal. No respiratory distress. He has no wheezes. He has no rhonchi. He has rales. He exhibits no tenderness.  Abdominal: Soft. Normal appearance and  bowel sounds are normal. He exhibits distension. He exhibits no ascites. There is no hepatosplenomegaly. There is generalized tenderness (mild).  Musculoskeletal: He exhibits edema (anasarca to the unbilicus).  Neurological: He is oriented to person, place, and time. He has normal strength. He appears lethargic.  Skin: Skin is warm, dry and intact. He is not diaphoretic.  Psychiatric: His affect is blunt. His speech is slurred. He is slowed.    Labs: CBC:  Recent Labs Lab 03/30/16 1009 03/30/16 1040  WBC 6.2  --   NEUTROABS 5.3  --   HGB 9.1* 10.2*  HCT 28.6* 30.0*  MCV 68.6*  --   PLT 128*  --    Basic Metabolic Panel:  Recent Labs Lab 03/30/16 1009 03/30/16 1040  NA 135 134*  K 5.3* 5.3*  CL 105 108  CO2 10*  --   GLUCOSE 97 93  BUN 198* >140*  CREATININE 21.08* >18.00*  CALCIUM 7.9*  --  Cardiac Enzymes:  Recent Labs Lab 03/30/16 1027  TROPIPOC 0.09*   BNP (last 3 results)  Recent Labs  01/17/16 0829 03/30/16 1009  BNP 2,487.1* 1,563.1*   Liver Function Tests:  Recent Labs Lab 03/30/16 1009  AST 10*  ALT 14*  ALKPHOS 73  BILITOT 0.7  PROT 6.5  ALBUMIN 3.4*    Recent Labs Lab 03/30/16 1033  AMMONIA 68*   EKG: EKG Interpretation  Date/Time:  Monday March 30 2016 10:00:13 EST Ventricular Rate:  79 PR Interval:  180 QRS Duration: 130 QT Interval:  444 QTC Calculation: 509 R Axis:   -65 Text Interpretation:  Normal sinus rhythm Left axis deviation Right bundle branch block Anterior infarct , age undetermined T wave abnormality, consider lateral ischemia  - not new No significant change since last tracing. Doristine Devoid [phone])  on 03/30/2016 12:39:52 PM  Imaging: Dg Chest 2 View  Result Date: 03/30/2016 CLINICAL DATA:  Chest pain and shortness of breath. Congestive heart failure. EXAM: CHEST  2 VIEW COMPARISON:  One-view chest x-ray 01/17/2016 FINDINGS: Lordotic angulation is noted. This exaggerates the heart size. There is  no edema or effusion. No focal airspace disease is present. The visualized soft tissues and bony thorax are unremarkable. IMPRESSION: Cardiomegaly without failure. Electronically Signed   By: San Morelle M.D.   On: 03/30/2016 11:12   Assessment & Plan by Problem: Active Problems:   Acute on chronic renal failure [facility])  Cameron [name] is a 45 y.o. male with PMH of HTN, HFrEF, CKD 5 who presents with anasarca and SOB which appears 2/2 acute on chronic renal failure.  1) Acute on CKD, stage 5: Pt with h/o renal failure and unknown baseline SCr 12 during last hospitalization now 21. Presents with anasarca and uremia after discontinuing his home diuretics. Will likely require HD. Nephrology c/s, appreciate recs. - admit to med-surg - emergent HD catheter placed, HD today  2) HFrEF (EF 35-40%): Pt w/ recent echo in September, who discontinued his diuretics at home and presents with anasarca. CXR w/o obvious pulmonary edema and lung exam significant for minor coarse crackles, however no resp distress. HF unclear whether ischemic or non-ischemic d/t chronic HTN and drug abuse. Pt reports non-compliance w/ home meds. Holding home meds: carvedilol 25mg  BID, hydralazine 100mg  TID, Imdur 60mg  qD. - HD as above - restart carvedilol 12.5 mg BID  3) HTN: BP currently stable. Will hold home meds as above. Continue to monitor.  DVT PPx - low molecular weight heparin  Code Status - FULL  Consults Placed - Nephrology  Dispo: Admit Cameron to Inpatient with expected length of stay greater than 2 midnights.  Signed: Holley Raring, MD 03/30/2016, 3:52 PM  Pager: [phone]

## 2016-03-30 NOTE — ED Provider Notes (Signed)
[facility] DEPT Provider Note   CSN: [id number] Arrival date & time: 03/30/16  6948     History   Chief Complaint Chief Complaint  Patient presents with  . Chest Pain  . Shortness of Breath    HPI Cameron [name] is a 45 y.o. male who presents emergency Department with chief complaint of shortness of breath and chest pain. Patient states that he has been having swelling which has been progressively worsening. He feels extremely short of breath at rest and has significant difficulty exerting himself. He also complains of severe swelling from his feet all the way up to his chest. He has swelling in his genitals. He has a history of severe CHF, admission for anasarca and kidney disease stage IV. He also has a history of polysubstance abuse. His wife states that he was compliant with his medications for about 1 week after discharge but felt better and hasn't been taking anything. He states that he has been taking his fluid pills and has been making urine. He has had intermittent altered mental status over the past week with including slurred speech. He denies fevers or chills.  HPI  Past Medical History:  Diagnosis Date  . CHF (congestive heart failure) ([place])   . Hypertension   . Renal disorder     Patient Active Problem List   Diagnosis Date Noted  . Acute on chronic renal failure ([place]) 03/30/2016  . Palliative care by specialist   . Advance directive declined by patient   . Renal failure   . Acute on chronic combined systolic and diastolic heart failure ([place]) 01/17/2016  . Anasarca   . Acute renal failure superimposed on stage 4 chronic kidney disease ([place])   . Polysubstance abuse   . Tobacco abuse   . Nonischemic cardiomyopathy ([place])   . Noncompliance     Past Surgical History:  Procedure Laterality Date  . NO PAST SURGERIES         Home Medications    Prior to Admission medications   Medication Sig Start Date End Date Taking? Authorizing Provider  aspirin EC  81 MG EC tablet Take 1 tablet (81 mg total) by mouth daily. 01/23/16  Yes Barton Dubois, MD  furosemide (LASIX) 80 MG tablet Take 2 tablets (160 mg total) by mouth 3 (three) times daily. 01/22/16  Yes Barton Dubois, MD  hydrALAZINE (APRESOLINE) 100 MG tablet Take 1 tablet (100 mg total) by mouth 3 (three) times daily. 01/22/16  Yes Barton Dubois, MD  calcium acetate (PHOSLO) 667 MG capsule Take 1 capsule (667 mg total) by mouth 3 (three) times daily with meals. 01/22/16   Barton Dubois, MD  carvedilol (COREG) 25 MG tablet Take 1 tablet (25 mg total) by mouth 2 (two) times daily with a meal. 01/22/16   Barton Dubois, MD  isosorbide mononitrate (IMDUR) 60 MG 24 hr tablet Take 1 tablet (60 mg total) by mouth daily. 01/23/16   Barton Dubois, MD  metolazone (ZAROXOLYN) 5 MG tablet Take 1 tablet (5 mg total) by mouth daily. 01/23/16   Barton Dubois, MD    Family History No family history on file.  Social History Social History  Substance Use Topics  . Smoking status: Former Smoker    Packs/day: 0.10    Years: 5.00    Types: Cigarettes  . Smokeless tobacco: Never Used     Comment: quit  smoking years ago "  . Alcohol use No     Allergies   Patient has no known allergies.  Review of Systems Review of Systems  Ten systems reviewed and are negative for acute change, except as noted in the HPI.   Physical Exam Updated Vital Signs BP 154/100 (BP Location: Right Arm)   Pulse 71   Temp 97.6 F (36.4 C) (Oral)   Resp 18   Ht 5\' 2"  (1.575 m)   Wt 103.4 kg   SpO2 98%   BMI 41.70 kg/m   Physical Exam  Constitutional: No distress.  HENT:  Head: Normocephalic and atraumatic.  Uremic Lois Huxley noted over the face and scalp Edematous eyelids  Eyes: Conjunctivae and EOM are normal. Pupils are equal, round, and reactive to light. No scleral icterus.  Neck: Normal range of motion. Neck supple.  Cardiovascular: Regular rhythm.   Distant heart sounds +anasarca 3+ pitting edema from the feet to  the umbilicus.  Pulmonary/Chest: No respiratory distress.  Pickwickian upper airway sounds. Minimal breath sounds in the middle to low lung bases, Diffuse rales upper airways  Abdominal: Soft. He exhibits distension. There is no tenderness.  Tight, distended abdomen  Genitourinary:  Genitourinary Comments: Edematous genitals  Musculoskeletal: He exhibits no edema.  Neurological: He is alert.  Skin: Skin is warm. He is not diaphoretic.  Psychiatric: His behavior is normal.  Nursing note and vitals reviewed.    ED Treatments / Results  Labs (all labs ordered are listed, but only abnormal results are displayed) Labs Reviewed  COMPREHENSIVE METABOLIC PANEL - Abnormal; Notable for the following:       Result Value   Potassium 5.3 (*)    CO2 10 (*)    BUN 198 (*)    Creatinine, Ser 21.08 (*)    Calcium 7.9 (*)    Albumin 3.4 (*)    AST 10 (*)    ALT 14 (*)    GFR calc non Af Amer 2 (*)    GFR calc Af Amer 3 (*)    Anion gap 20 (*)    All other components within normal limits  CBC WITH DIFFERENTIAL/PLATELET - Abnormal; Notable for the following:    RBC 4.17 (*)    Hemoglobin 9.1 (*)    HCT 28.6 (*)    MCV 68.6 (*)    MCH 21.8 (*)    RDW 20.0 (*)    Platelets 128 (*)    Lymphs Abs 0.6 (*)    All other components within normal limits  BRAIN NATRIURETIC PEPTIDE - Abnormal; Notable for the following:    B Natriuretic Peptide 1,563.1 (*)    All other components within normal limits  AMMONIA - Abnormal; Notable for the following:    Ammonia 68 (*)    All other components within normal limits  I-STAT TROPOININ, ED - Abnormal; Notable for the following:    Troponin i, poc 0.09 (*)    All other components within normal limits  I-STAT CHEM 8, ED - Abnormal; Notable for the following:    Sodium 134 (*)    Potassium 5.3 (*)    BUN >140 (*)    Creatinine, Ser >18.00 (*)    Calcium, Ion 0.95 (*)    Hemoglobin 10.2 (*)    HCT 30.0 (*)    All other components within normal  limits  I-STAT VENOUS BLOOD GAS, ED - Abnormal; Notable for the following:    pH, Ven 7.146 (*)    pCO2, Ven 27.1 (*)    pO2, Ven 75.0 (*)    Bicarbonate 9.4 (*)    Acid-base deficit 18.0 (*)  All other components within normal limits  RAPID URINE DRUG SCREEN, HOSP PERFORMED  BLOOD GAS, VENOUS  HEPATITIS B SURFACE ANTIGEN  HEPATITIS B SURFACE ANTIBODY  HEPATITIS B CORE ANTIBODY, IGM  HEPATITIS C ANTIBODY (REFLEX)  HIV ANTIBODY (ROUTINE TESTING)    EKG  EKG Interpretation  Date/Time:  Monday March 30 2016 10:00:13 EST Ventricular Rate:  79 PR Interval:  180 QRS Duration: 130 QT Interval:  444 QTC Calculation: 509 R Axis:   -65 Text Interpretation:  Normal sinus rhythm Left axis deviation Right bundle branch block Anterior infarct , age undetermined T wave abnormality, consider lateral ischemia  - not new No significant change since last tracing Confirmed by Kathrynn Humble, MD, Thelma Comp [phone]) on 03/30/2016 10:28:33 AM Also confirmed by Kathrynn Humble, MD, Thelma Comp [phone])  on 03/30/2016 11:46:07 AM Also confirmed by Kathrynn Humble, MD, Thelma Comp [phone]), editor [place] CT, Leda Gauze [phone])  on 03/30/2016 12:39:52 PM       Radiology Dg Chest 2 View  Result Date: 03/30/2016 CLINICAL DATA:  Chest pain and shortness of breath. Congestive heart failure. EXAM: CHEST  2 VIEW COMPARISON:  One-view chest x-ray 01/17/2016 FINDINGS: Lordotic angulation is noted. This exaggerates the heart size. There is no edema or effusion. No focal airspace disease is present. The visualized soft tissues and bony thorax are unremarkable. IMPRESSION: Cardiomegaly without failure. Electronically Signed   By: San Morelle M.D.   On: 03/30/2016 11:12    Procedures .Critical Care Performed by: Margarita Mail Authorized by: Margarita Mail   Critical care provider statement:    Critical care time (minutes):  45   Critical care was necessary to treat or prevent imminent or life-threatening deterioration of the following  conditions:  Renal failure   Critical care was time spent personally by me on the following activities:  Discussions with consultants, evaluation of patient's response to treatment, examination of patient, obtaining history from patient or surrogate, review of old charts, re-evaluation of patient's condition, pulse oximetry, ordering and review of radiographic studies, ordering and review of laboratory studies and ordering and performing treatments and interventions Comments:     Renal failure with pulmonary edema, anasarca, BUN > 140, altered mentation and urgent need for dialysis.   (including critical care time)  Medications Ordered in ED Medications  metolazone (ZAROXOLYN) tablet 5 mg (not administered)  heparin injection 5,000 Units (not administered)  furosemide (LASIX) 120 mg in dextrose 5 % 50 mL IVPB (not administered)     Initial Impression / Assessment and Plan / ED Course  I have reviewed the triage vital signs and the nursing notes.  Pertinent labs & imaging results that were available during my care of the patient were reviewed by me and considered in my medical decision making (see chart for details).  Clinical Course as of Mar 30 1628  Mon Mar 30, 2016  1129 Troponin is elevated, however this is likely secondary to his renal failure. And review shwes that they are chronically elevated. Troponin i, poc: (!!) 0.09 [AH]  1130 BUN: (!) >140 [AH]  1131 Creatinine up from 12.65 to 18 and BUN >140 Creatinine: (!) >18.00 [AH]  1214 Chest x-ray is read as not having any acute failure. However, this does not fit the clinical picture of the patient who appears to have significant volume overload and pulmonary edema. DG Chest 2 View [AH]    Clinical Course User Index [AH] Margarita Mail, PA-C      Final Clinical Impressions(s) / ED Diagnoses   Final diagnoses:  Anasarca  Uremia  Hyperammonemia (HCC)  Transient alteration of awareness    New Prescriptions New  Prescriptions   No medications on file     Margarita Mail, PA-C 03/30/16 [place], MD 04/04/16 [phone]

## 2016-03-30 NOTE — ED Notes (Signed)
Patient comes in with c/o sob and cp for a week. Hx of CHF. Patient states he's been off his lasix for over 3 months d/t money issues.

## 2016-03-30 NOTE — Procedures (Signed)
Patient seen on Hemodialysis. QB 250, UF goal 2.5L, BP139/100, RIJ HD catheter. No complaints on dialysis.  Cameron Shiley MD [facility]. Office # [phone] Pager # [phone] 8:03 PM

## 2016-03-30 NOTE — ED Notes (Signed)
Dr, Concepcion Living begins numbing process. Pt verbal and states ok.

## 2016-03-30 NOTE — ED Notes (Signed)
Pt being transported to x-ray at this time.

## 2016-03-30 NOTE — ED Notes (Signed)
Placing dialysis catheter at bedside.

## 2016-03-30 NOTE — ED Notes (Signed)
MD at bedside. 

## 2016-03-30 NOTE — ED Notes (Signed)
Pt returned to room from xray.

## 2016-03-30 NOTE — ED Notes (Signed)
ED Provider at bedside. 

## 2016-03-30 NOTE — ED Notes (Signed)
Pt unable to void at this time. 

## 2016-03-30 NOTE — ED Triage Notes (Addendum)
Pt with hx of chf and end stage kidney disease (not on dialysis) with chest pain increased swelling and sob.  Visitor states pt's speech has been slurring x 1 week.  AO x 4.

## 2016-03-30 NOTE — Consult Note (Signed)
Reason for Consult:CKD5, uremia Referring Physician: Dr. Daphene Calamity is an 45 y.o. male.  HPI: 45 yr male with CKD 5 hosp in 9/17 here with anasarca/fluid xs.  Hx obtained at that time with progressive renal dz.  He refused any intervention or planning.  Now with progressive edema, SOB, cannot sleep, twitching, metallic taste in mouth, N, anorexia, itching, cannot move without aching, SOB.  Cr now 21 and BUN 198.  Desires intervention and to start HD.  Discussed implications. Constitutional: miserable,  Eyes: poor vision Ears, nose, mouth, throat, and face: as above Respiratory: no cough, but SOB Cardiovascular: N, V Gastrointestinal: N,V Genitourinary:very little urine Integument/breast: itching Musculoskeletal:itching Neurological: weak,  Endocrine: negative Allergic/Immunologic: negative SOB, PND, othop,    Past Medical History:  Diagnosis Date  . CHF (congestive heart failure) ([place])   . Hypertension   . Renal disorder     Past Surgical History:  Procedure Laterality Date  . NO PAST SURGERIES      No family history on file.  Social History:  reports that he has quit smoking. His smoking use included Cigarettes. He has a 0.50 pack-year smoking history. He has never used smokeless tobacco. He reports that he does not drink alcohol or use drugs.  Allergies: No Known Allergies  Medications: I have reviewed the patient's current medications. Prior to Admission:  (Not in a hospital admission) Not taking any home meds   Results for orders placed or performed during the hospital encounter of 03/30/16 (from the past 48 hour(s))  Comprehensive metabolic panel     Status: Abnormal   Collection Time: 03/30/16 10:09 AM  Result Value Ref Range   Sodium 135 135 - 145 mmol/L   Potassium 5.3 (H) 3.5 - 5.1 mmol/L   Chloride 105 101 - 111 mmol/L   CO2 10 (L) 22 - 32 mmol/L   Glucose, Bld 97 65 - 99 mg/dL   BUN 198 (H) 6 - 20 mg/dL   Creatinine, Ser 21.08 (H) 0.61 -  1.24 mg/dL   Calcium 7.9 (L) 8.9 - 10.3 mg/dL   Total Protein 6.5 6.5 - 8.1 g/dL   Albumin 3.4 (L) 3.5 - 5.0 g/dL   AST 10 (L) 15 - 41 U/L   ALT 14 (L) 17 - 63 U/L   Alkaline Phosphatase 73 38 - 126 U/L   Total Bilirubin 0.7 0.3 - 1.2 mg/dL   GFR calc non Af Amer 2 (L) >60 mL/min   GFR calc Af Amer 3 (L) >60 mL/min    Comment: (NOTE) The eGFR has been calculated using the CKD EPI equation. This calculation has not been validated in all clinical situations. eGFR's persistently <60 mL/min signify possible Chronic Kidney Disease.    Anion gap 20 (H) 5 - 15  CBC with Differential/Platelet     Status: Abnormal   Collection Time: 03/30/16 10:09 AM  Result Value Ref Range   WBC 6.2 4.0 - 10.5 K/uL   RBC 4.17 (L) 4.22 - 5.81 MIL/uL   Hemoglobin 9.1 (L) 13.0 - 17.0 g/dL   HCT 28.6 (L) 39.0 - 52.0 %   MCV 68.6 (L) 78.0 - 100.0 fL   MCH 21.8 (L) 26.0 - 34.0 pg   MCHC 31.8 30.0 - 36.0 g/dL   RDW 20.0 (H) 11.5 - 15.5 %   Platelets 128 (L) 150 - 400 K/uL    Comment: PLATELET COUNT CONFIRMED BY SMEAR   Neutrophils Relative % 84 %   Lymphocytes Relative 10 %  Monocytes Relative 4 %   Eosinophils Relative 2 %   Basophils Relative 0 %   Neutro Abs 5.3 1.7 - 7.7 K/uL   Lymphs Abs 0.6 (L) 0.7 - 4.0 K/uL   Monocytes Absolute 0.2 0.1 - 1.0 K/uL   Eosinophils Absolute 0.1 0.0 - 0.7 K/uL   Basophils Absolute 0.0 0.0 - 0.1 K/uL   RBC Morphology POLYCHROMASIA PRESENT     Comment: ELLIPTOCYTES BURR CELLS SPHEROCYTES   Brain natriuretic peptide     Status: Abnormal   Collection Time: 03/30/16 10:09 AM  Result Value Ref Range   B Natriuretic Peptide 1,563.1 (H) 0.0 - 100.0 pg/mL  I-stat troponin, ED     Status: Abnormal   Collection Time: 03/30/16 10:27 AM  Result Value Ref Range   Troponin i, poc 0.09 (HH) 0.00 - 0.08 ng/mL   Comment NOTIFIED PHYSICIAN    Comment 3            Comment: Due to the release kinetics of cTnI, a negative result within the first hours of the onset of symptoms  does not rule out myocardial infarction with certainty. If myocardial infarction is still suspected, repeat the test at appropriate intervals.   Ammonia     Status: Abnormal   Collection Time: 03/30/16 10:33 AM  Result Value Ref Range   Ammonia 68 (H) 9 - 35 umol/L  I-stat chem 8, ed     Status: Abnormal   Collection Time: 03/30/16 10:40 AM  Result Value Ref Range   Sodium 134 (L) 135 - 145 mmol/L   Potassium 5.3 (H) 3.5 - 5.1 mmol/L   Chloride 108 101 - 111 mmol/L   BUN >140 (H) 6 - 20 mg/dL   Creatinine, Ser >18.00 (H) 0.61 - 1.24 mg/dL   Glucose, Bld 93 65 - 99 mg/dL   Calcium, Ion 0.95 (L) 1.15 - 1.40 mmol/L   TCO2 12 0 - 100 mmol/L   Hemoglobin 10.2 (L) 13.0 - 17.0 g/dL   HCT 30.0 (L) 39.0 - 52.0 %  I-Stat venous blood gas, ED     Status: Abnormal   Collection Time: 03/30/16 12:15 PM  Result Value Ref Range   pH, Ven 7.146 (LL) 7.250 - 7.430   pCO2, Ven 27.1 (L) 44.0 - 60.0 mmHg   pO2, Ven 75.0 (H) 32.0 - 45.0 mmHg   Bicarbonate 9.4 (L) 20.0 - 28.0 mmol/L   TCO2 10 0 - 100 mmol/L   O2 Saturation 90.0 %   Acid-base deficit 18.0 (H) 0.0 - 2.0 mmol/L   Patient temperature HIDE    Sample type VENOUS    Comment NOTIFIED PHYSICIAN     Dg Chest 2 View  Result Date: 03/30/2016 CLINICAL DATA:  Chest pain and shortness of breath. Congestive heart failure. EXAM: CHEST  2 VIEW COMPARISON:  One-view chest x-ray 01/17/2016 FINDINGS: Lordotic angulation is noted. This exaggerates the heart size. There is no edema or effusion. No focal airspace disease is present. The visualized soft tissues and bony thorax are unremarkable. IMPRESSION: Cardiomegaly without failure. Electronically Signed   By: San Morelle M.D.   On: 03/30/2016 11:12    ROS Blood pressure 154/100, pulse 71, temperature 97.6 F (36.4 C), temperature source Oral, resp. rate 18, height 5' 2"  (1.575 m), weight 103.4 kg (228 lb), SpO2 98 %. Physical Exam Physical Examination: General appearance - edematous,  miserable, SOB Mental status - slowed mentation Eyes - severe hypertensive retinopathy Mouth - pale Neck - adenopathy noted PCL Lymphatics -  posterior cervical nodes Chest - rales noted bibasilar, decreased air entry noted bilat Heart - S1 and S2 normal, S4 present, systolic murmur HY3/8 at apex Abdomen - distended, few bs, tight Neurological - slow mentation, twitching,  Musculoskeletal - no joint tenderness, deformity or swelling Extremities - pedal edema 4+ + Skin - sallow, fine flecks with uremic frost, dry  Assessment/Plan: 1 Severe uremia ,vol xs, acidemia, Needs emergent HD, will get temp cath.  Lower vol.  This is ESRD 2 ESRD: need preparation , education access 3 Hypertension: vol and meds 4. Anemia of ESRD: will eval 5. Metabolic Bone Disease: check PTH, phos 6 NONADERENCE P Temp cath, HD qd, lower vol , solute.  Hep studies, PTH, Fe/TIBC, educate map  [place] L 03/30/2016, 4:23 PM

## 2016-03-31 ENCOUNTER — Inpatient Hospital Stay (HOSPITAL_COMMUNITY): Payer: Medicaid Other

## 2016-03-31 ENCOUNTER — Encounter (HOSPITAL_COMMUNITY): Payer: Self-pay | Admitting: Physician Assistant

## 2016-03-31 DIAGNOSIS — I509 Heart failure, unspecified: Secondary | ICD-10-CM

## 2016-03-31 DIAGNOSIS — N185 Chronic kidney disease, stage 5: Secondary | ICD-10-CM

## 2016-03-31 DIAGNOSIS — N186 End stage renal disease: Secondary | ICD-10-CM

## 2016-03-31 DIAGNOSIS — I10 Essential (primary) hypertension: Secondary | ICD-10-CM

## 2016-03-31 LAB — BASIC METABOLIC PANEL
ANION GAP: 18 — AB (ref 5–15)
BUN: 146 mg/dL — ABNORMAL HIGH (ref 6–20)
CALCIUM: 7.7 mg/dL — AB (ref 8.9–10.3)
CO2: 15 mmol/L — ABNORMAL LOW (ref 22–32)
Chloride: 103 mmol/L (ref 101–111)
Creatinine, Ser: 17.1 mg/dL — ABNORMAL HIGH (ref 0.61–1.24)
GFR, EST AFRICAN AMERICAN: 3 mL/min — AB (ref >60)
GFR, EST NON AFRICAN AMERICAN: 3 mL/min — AB (ref >60)
Glucose, Bld: 129 mg/dL — ABNORMAL HIGH (ref 65–99)
POTASSIUM: 4.2 mmol/L (ref 3.5–5.1)
Sodium: 136 mmol/L (ref 135–145)

## 2016-03-31 LAB — CBC
HCT: 27.1 % — ABNORMAL LOW (ref 39.0–52.0)
HEMOGLOBIN: 8.6 g/dL — AB (ref 13.0–17.0)
MCH: 21.8 pg — ABNORMAL LOW (ref 26.0–34.0)
MCHC: 31.7 g/dL (ref 30.0–36.0)
MCV: 68.6 fL — ABNORMAL LOW (ref 78.0–100.0)
Platelets: 143 10*3/uL — ABNORMAL LOW (ref 150–400)
RBC: 3.95 MIL/uL — AB (ref 4.22–5.81)
RDW: 19.4 % — ABNORMAL HIGH (ref 11.5–15.5)
WBC: 6 10*3/uL (ref 4.0–10.5)

## 2016-03-31 LAB — HIV ANTIBODY (ROUTINE TESTING W REFLEX): HIV SCREEN 4TH GENERATION: NONREACTIVE

## 2016-03-31 LAB — HEPATITIS B CORE ANTIBODY, IGM: Hep B C IgM: NEGATIVE

## 2016-03-31 LAB — MRSA PCR SCREENING: MRSA BY PCR: NEGATIVE

## 2016-03-31 LAB — IRON AND TIBC
Iron: 33 ug/dL — ABNORMAL LOW (ref 45–182)
Saturation Ratios: 11 % — ABNORMAL LOW (ref 17.9–39.5)
TIBC: 287 ug/dL (ref 250–450)
UIBC: 254 ug/dL

## 2016-03-31 LAB — HEPATITIS C ANTIBODY (REFLEX)

## 2016-03-31 LAB — HEPATITIS B SURFACE ANTIBODY,QUALITATIVE: Hep B S Ab: NONREACTIVE

## 2016-03-31 LAB — HCV COMMENT:

## 2016-03-31 LAB — HEPATITIS B SURFACE ANTIGEN: Hepatitis B Surface Ag: NEGATIVE

## 2016-03-31 MED ORDER — CLONIDINE HCL 0.1 MG PO TABS
0.1000 mg | ORAL_TABLET | Freq: Once | ORAL | Status: AC
  Administered 2016-03-31: 0.1 mg via ORAL
  Filled 2016-03-31: qty 1

## 2016-03-31 NOTE — Progress Notes (Signed)
Subjective: Interval History: has no complaint, feels a little better.  Objective: Vital signs in last 24 hours: Temp:  [97.6 F (36.4 C)-98.4 F (36.9 C)] 98.1 F (36.7 C) (11/28 0819) Pulse Rate:  [61-97] 92 (11/28 0900) Resp:  [0-24] 15 (11/28 0900) BP: (114-170)/(76-117) 155/110 (11/28 0900) SpO2:  [95 %-100 %] 100 % (11/28 0819) Weight:  [100.6 kg (221 lb 12.5 oz)-103.4 kg (228 lb)] 100.6 kg (221 lb 12.5 oz) (11/28 0819) Weight change:   Intake/Output from previous day: 11/27 0701 - 11/28 0700 In: -  Out: 2500  Intake/Output this shift: No intake/output data recorded.  General appearance: alert, cooperative, mild distress and moderately obese Neck: RIJ cath Resp: diminished breath sounds bilaterally and rales bibasilar Cardio: S1, S2 normal and systolic murmur: holosystolic 2/6, blowing at apex GI: obese, pos bs, distended Extremities: edema 4+  Lab Results:  Recent Labs  03/30/16 1009 03/30/16 1040 03/31/16 0317  WBC 6.2  --  6.0  HGB 9.1* 10.2* 8.6*  HCT 28.6* 30.0* 27.1*  PLT 128*  --  143*   BMET:  Recent Labs  03/30/16 1009 03/30/16 1040 03/31/16 0317  NA 135 134* 136  K 5.3* 5.3* 4.2  CL 105 108 103  CO2 10*  --  15*  GLUCOSE 97 93 129*  BUN 198* >140* 146*  CREATININE 21.08* >18.00* 17.10*  CALCIUM 7.9*  --  7.7*   No results for input(s): PTH in the last 72 hours. Iron Studies: No results for input(s): IRON, TIBC, TRANSFERRIN, FERRITIN in the last 72 hours.  Studies/Results: Dg Chest 2 View  Result Date: 03/30/2016 CLINICAL DATA:  Chest pain and shortness of breath. Congestive heart failure. EXAM: CHEST  2 VIEW COMPARISON:  One-view chest x-ray 01/17/2016 FINDINGS: Lordotic angulation is noted. This exaggerates the heart size. There is no edema or effusion. No focal airspace disease is present. The visualized soft tissues and bony thorax are unremarkable. IMPRESSION: Cardiomegaly without failure. Electronically Signed   By: San Morelle M.D.   On: 03/30/2016 11:12   Dg Chest Portable 1 View  Result Date: 03/30/2016 CLINICAL DATA:  Right central line insertion, check tip position EXAM: PORTABLE CHEST 1 VIEW COMPARISON:  03/30/2016 at 1101 hours FINDINGS: There is a right IJ catheter with tip in the expected location of the mid SVC. No pneumothorax. Stable cardiomegaly. No effusion nor overt pulmonary edema. IMPRESSION: Right IJ catheter tip in the mid SVC. No pneumothorax. Stable cardiomegaly. Electronically Signed   By: Ashley Royalty M.D.   On: 03/30/2016 17:42    I have reviewed the patient's current medications.  Assessment/Plan: 1 ESRD vol xs, uremic, acidemic.  Slowly lowering solute,vol. Correct acidemia with HD.  Needs access and education 2 Anemia check Fe, use esa 3 HPTH check 4 HTN meds and lower vol 5 Nonadherence P HD, today and tomorrow.  Access per [facility],  Esa, check Fe and PTH    LOS: 1 day   Cameron [name] 03/31/2016,9:11 AM

## 2016-03-31 NOTE — Progress Notes (Addendum)
Bilateral Upper Extremity Vein Map  -bilateral edema  Right Cephalic  Segment Diameter Depth Comment  1. Axilla 3.29 mm 7.71 mm   2. Mid upper arm 3.19 mm 4.29 mm branch  3. Above AC 2.28 mm 3.74 mm   4. In AC 4.88 mm 3.74 mm branch  5. Below AC 2.56 mm 2.83 mm   6. Mid forearm 1.92 mm 2.74 mm   7. Wrist 2.33 mm 2.51 mm    Left Cephalic  Segment Diameter Depth Comment  1. Axilla 3.02 mm 4.98 mm   2. Mid upper arm 1.96 mm 3.15 mm   3. Above AC 3.1 mm 3.38 mm   4. In AC 3.83 mm 2.06 mm   5. Below AC 3.01 mm 2.51 mm Multiple branches  6. Mid forearm 2.06 mm 1.96 mm branch  7. Wrist 1.78 mm 2.11 mm

## 2016-03-31 NOTE — Progress Notes (Signed)
Subjective: Currently, the patient is in dialysis and is comfortable w/o focal complaints. No SOB, CP, abdominal pain.  Interval Events: Emergent vasc-cath palcement and HD x 2.  Objective: Vital signs in last 24 hours: Vitals:   03/31/16 1100 03/31/16 1121 03/31/16 1130 03/31/16 1210  BP: (!) 189/113 (!) 165/105 (!) 166/97   Pulse: 95 (!) 101 95 96  Resp:   18 20  Temp:   98 F (36.7 C)   TempSrc:   Oral   SpO2:   100% 100%  Weight:   97.8 kg (215 lb 9.8 oz)   Height:       24-hour weight change: Filed Weights   03/31/16 0318 03/31/16 0819 03/31/16 1130  Weight: 102.4 kg (225 lb 11.2 oz) 100.6 kg (221 lb 12.5 oz) 97.8 kg (215 lb 9.8 oz)   Dry Wt:  204 lbs after last admission in September 2017.  Intake/Output:  11/27 0701 - 11/28 0700 In: -  Out: 2500     Physical Exam: Physical Exam  Constitutional: No distress.  Cardiovascular: Normal rate, regular rhythm, normal heart sounds and intact distal pulses.   Pulmonary/Chest: Effort normal and breath sounds normal. No respiratory distress. He has no wheezes. He has no rales.  Abdominal: Soft. Bowel sounds are normal. There is no tenderness.  Musculoskeletal: He exhibits edema (gross anasarca to the hips, mildly improved. 2+ pitting on hips/thighs, 3+ pitting on bilat LE).   Labs: CBC:  Recent Labs Lab 03/30/16 1009 03/30/16 1040 03/31/16 0317  WBC 6.2  --  6.0  NEUTROABS 5.3  --   --   HGB 9.1* 10.2* 8.6*  HCT 28.6* 30.0* 27.1*  MCV 68.6*  --  68.6*  PLT 128*  --  009*   Metabolic Panel:  Recent Labs Lab 03/30/16 1009 03/30/16 1033 03/30/16 1040 03/31/16 0317  NA 135  --  134* 136  K 5.3*  --  5.3* 4.2  CL 105  --  108 103  CO2 10*  --   --  15*  GLUCOSE 97  --  93 129*  BUN 198*  --  >140* 146*  CREATININE 21.08*  --  >18.00* 17.10*  CALCIUM 7.9*  --   --  7.7*  ALT 14*  --   --   --   ALKPHOS 73  --   --   --   BILITOT 0.7  --   --   --   PROT 6.5  --   --   --   ALBUMIN 3.4*  --   --   --    AMMONIA  --  68*  --   --    Cardiac Labs:  Recent Labs Lab 03/30/16 1009 03/30/16 1027  TROPIPOC  --  0.09*  BNP 1,563.1*  --      Medications: Infusions:  Scheduled Medications: . calcium acetate  2,001 mg Oral TID WC  . carvedilol  12.5 mg Oral BID WC  . heparin  5,000 Units Subcutaneous Q8H  . multivitamin  1 tablet Oral QHS   PRN Medications:   Assessment/Plan: Mr. Keats Kingry is a 45 y.o. male with PMH of HTN, HFrEF, CKD 5 who presents with anasarca and SOB which appears 2/2 acute on chronic renal failure.  1) Acute on CKD, stage 5: Pt with h/o renal failure and unknown baseline, SCr 12 during last hospitalization, 21 on presentation. Now s/p HD x 2 after emergent vasc-cath placement for management of uremia, AG acidosis, hyperkalemia, and  hypervolemia.  Vascular Surg to see today for planning of permanent HD access. Nephrology following, appreciate recs. - Vasc Surg recs: plan vein mapping - Continue HD per Nephrology - daily BMP/CBC  2) HFrEF (EF 35-40%): Pt w/ recent echo in September, who discontinued his diuretics at home and presents with anasarca. No resp distress or signs of pulm edema on CXR. HF unclear whether ischemic or non-ischemic d/t chronic HTN and drug abuse. Previously prescribed carvedilol 25mg  BID, hydralazine 100mg  TID, Imdur 60mg  qD. - HD as above - Increase carvedilol to 25mg  BID  3) HTN: BP mildly elevated to 160s SBP. Increased carvedilol today. Consider adding back hydralazine for additional control.  Length of Stay: 1 day(s) Dispo: Anticipated discharge after uremia and anasarca managed successfully with HD. Pt will need outpt HD center established. Appreciate assistance of social work and care management in establishing patient with appropriate resources for new start HD.  Holley Raring, MD Pager: [phone] (7AM-5PM) 03/31/2016, 2:54 PM

## 2016-03-31 NOTE — Procedures (Signed)
I was present at this session.  I have reviewed the session itself and made appropriate changes. bp ^, 2nd Hd, slow ineffficient.  Educated.    Litsy Epting L 11/28/20179:07 AM

## 2016-03-31 NOTE — Consult Note (Signed)
Hospital Consult    Reason for Consult:  In need of dialysis access Referring Physician:  Deterding MRN #:  [id number]  History of Present Illness: This is a 45 y.o. male who presented to the ED with complaints of anasarca, SOB and reduced UOP for about a week.  He was in the ED in September with similar sx and a baseline with CKD 4 with combined systolic and diastolic HF.  A cardiac catheterization was recommended to determine ischemic vs non-ischemic but the pt declined.  He was treated with Lasix and metolazone diuresis and was discharged on medications.  He discontinued these medications after his last hospital visit.  He has had progressive swelling and increasing shortness of breath.  He has had difficulty with malaise, confusion, difficulty sleeping, chest tightness and decreased urine frequency.    His chart is noted that the pt has an EF of 35%.  In the ER this visit, he was found to have K+ of 5.3, BUN OF 198 & creatinine of 21. EKG revealed no ischemic changes.  He was admitted to the hospital and a temporary dialysis catheter was placed and he underwent dialysis.    He is on a baby aspirin daily.  He is on a beta blocker for blood pressure control.  He is on diuretics but it is noted he has not been taking these medications.    Of note, the pt was somewhat sleeping and did not want to give information and therefore the history was mostly taken from the chart.  RN came with me to see the pt and this is his baseline if he has been sleeping as this is how he was prior to going to HD.  Otherwise, he is up and about.  He did wake enough to answer a few questions.  RN states that he had been up eating lunch prior to my arrival.   Past Medical History:  Diagnosis Date  . CHF (congestive heart failure) ([place])   . Hypertension   . Renal disorder     Past Surgical History:  Procedure Laterality Date  . NO PAST SURGERIES      No Known Allergies  Prior to Admission medications     Medication Sig Start Date End Date Taking? Authorizing Provider  aspirin EC 81 MG EC tablet Take 1 tablet (81 mg total) by mouth daily. 01/23/16  Yes Barton Dubois, MD  furosemide (LASIX) 80 MG tablet Take 2 tablets (160 mg total) by mouth 3 (three) times daily. 01/22/16  Yes Barton Dubois, MD  hydrALAZINE (APRESOLINE) 100 MG tablet Take 1 tablet (100 mg total) by mouth 3 (three) times daily. 01/22/16  Yes Barton Dubois, MD  calcium acetate (PHOSLO) 667 MG capsule Take 1 capsule (667 mg total) by mouth 3 (three) times daily with meals. 01/22/16   Barton Dubois, MD  carvedilol (COREG) 25 MG tablet Take 1 tablet (25 mg total) by mouth 2 (two) times daily with a meal. 01/22/16   Barton Dubois, MD  isosorbide mononitrate (IMDUR) 60 MG 24 hr tablet Take 1 tablet (60 mg total) by mouth daily. 01/23/16   Barton Dubois, MD  metolazone (ZAROXOLYN) 5 MG tablet Take 1 tablet (5 mg total) by mouth daily. 01/23/16   Barton Dubois, MD    Social History   Social History  . Marital status: Married    Spouse name: N/A  . Number of children: N/A  . Years of education: N/A   Occupational History  . Not on file.  Social History Main Topics  . Smoking status: Former Smoker    Packs/day: 0.10    Years: 5.00    Types: Cigarettes  . Smokeless tobacco: Never Used     Comment: quit  smoking years ago "  . Alcohol use No  . Drug use: No  . Sexual activity: Not on file   Other Topics Concern  . Not on file   Social History Narrative  . No narrative on file     Family History  Problem Relation Age of Onset  . Kidney failure Mother     was on dialysis    ROS: [x]  Positive   [ ]  Negative   [ ]  All sytems reviewed and are negative  Cardiovascular: [x]  chest pain/pressure []  palpitations [x]  SOB lying flat [x]  DOE []  pain in legs while walking []  pain in legs at rest []  pain in legs at night []  non-healing ulcers []  hx of DVT [x]  swelling in legs  Pulmonary: []  productive cough []   asthma/wheezing []  home O2  Neurologic: []  weakness in []  arms []  legs []  numbness in []  arms []  legs []  hx of CVA []  mini stroke [] difficulty speaking or slurred speech []  temporary loss of vision in one eye []  dizziness  Hematologic: []  hx of cancer []  bleeding problems []  problems with blood clotting easily [x]  anemia  Endocrine:   []  diabetes []  thyroid disease  GI []  vomiting blood []  blood in stool  GU: [x]  CKD/renal failure []  HD--[]  M/W/F or []  T/T/S []  burning with urination []  blood in urine [x]  decreased urine   Psychiatric: []  anxiety []  depression  Musculoskeletal: []  arthritis []  joint pain  Integumentary: []  rashes []  ulcers  Constitutional: []  fever []  chills [x]  malaise   Physical Examination  Vitals:   03/31/16 1130 03/31/16 1210  BP: (!) 166/97   Pulse: 95 96  Resp: 18 20  Temp: 98 F (36.7 C)    Body mass index is 39.44 kg/m.  General:  WDWN in NAD Gait: Not observed HENT: WNL, normocephalic Pulmonary: normal non-labored breathing, without Rales, rhonchi,  wheezing Cardiac: regular, without  Murmurs, rubs or gallops; without carotid bruit on the left.  Temp IJ catheter on the right. Abdomen:  soft, NT/ND, no masses Skin: without rashes Vascular Exam/Pulses:  Right Left  Radial 2+ (normal) 1+-2+ weaker than the right  Ulnar Unable to palpate  Unable to palpate   DP Triphasic doppler signal Biphasic doppler signal  PT Triphasic doppler signal Triphasic doppler signal   Extremities: without ischemic changes, without Gangrene , without cellulitis; without open wounds; 3-4+edema BLE Musculoskeletal: no muscle wasting or atrophy  Neurologic: sleepy;  No focal weakness or paresthesias are detected; speech is fluent/normal Psychiatric:  The pt has abnormal affect as he is very sleepy. He does awake and answer questions.  RN says this is baseline after he has been sleeping as he was this way prior to dialysis.     CBC      Component Value Date/Time   WBC 6.0 03/31/2016 0317   RBC 3.95 (L) 03/31/2016 0317   HGB 8.6 (L) 03/31/2016 0317   HCT 27.1 (L) 03/31/2016 0317   PLT 143 (L) 03/31/2016 0317   MCV 68.6 (L) 03/31/2016 0317   MCH 21.8 (L) 03/31/2016 0317   MCHC 31.7 03/31/2016 0317   RDW 19.4 (H) 03/31/2016 0317   LYMPHSABS 0.6 (L) 03/30/2016 1009   MONOABS 0.2 03/30/2016 1009   EOSABS 0.1 03/30/2016 1009  BASOSABS 0.0 03/30/2016 1009    BMET    Component Value Date/Time   NA 136 03/31/2016 0317   K 4.2 03/31/2016 0317   CL 103 03/31/2016 0317   CO2 15 (L) 03/31/2016 0317   GLUCOSE 129 (H) 03/31/2016 0317   BUN 146 (H) 03/31/2016 0317   CREATININE 17.10 (H) 03/31/2016 0317   CALCIUM 7.7 (L) 03/31/2016 0317   GFRNONAA 3 (L) 03/31/2016 0317   GFRAA 3 (L) 03/31/2016 0317    COAGS: No results found for: INR, PROTIME   Non-Invasive Vascular Imaging:   Vein mapping ordered.  Statin:  No. Beta Blocker:  Yes.   Aspirin:  Yes.   ACEI:  No. ARB:  No. CCB use:  No Other antiplatelets/anticoagulants:  Yes.  SQ heparin   ASSESSMENT/PLAN: This is a 45 y.o. male admitted yesterday with uremia and now with ESRD in need of dialysis access [facility] & permanent access).   -The pt has abnormal affect as he is very sleepy. He does awake and answer questions.  RN says this is baseline after he has been sleeping as he was this way prior to dialysis.   -he states he is left handed.  The pulse in the right hand is better than the left.  There is an IV in the right arm and will most likely need to be moved pending vein mapping results.   -I discussed dialysis access.  He did stay awake for this and did not have any questions at this time.   -Dr. Trula Slade will be by to see the pt later today.    Leontine Locket, PA-C [facility] [phone]   I agree with the above.  I have seen and evaluated the patient.  He is RIGHT handed.  Vein mapping shows an adequate left cephalic vein.  He'll  be scheduled for a left arm fistula and tunneled permanent dialysis catheter  Wells Brabham

## 2016-04-01 DIAGNOSIS — Z992 Dependence on renal dialysis: Secondary | ICD-10-CM

## 2016-04-01 DIAGNOSIS — N049 Nephrotic syndrome with unspecified morphologic changes: Secondary | ICD-10-CM

## 2016-04-01 DIAGNOSIS — N185 Chronic kidney disease, stage 5: Secondary | ICD-10-CM

## 2016-04-01 LAB — BASIC METABOLIC PANEL
Anion gap: 17 — ABNORMAL HIGH (ref 5–15)
BUN: 112 mg/dL — AB (ref 6–20)
CHLORIDE: 104 mmol/L (ref 101–111)
CO2: 17 mmol/L — AB (ref 22–32)
CREATININE: 14.58 mg/dL — AB (ref 0.61–1.24)
Calcium: 7.3 mg/dL — ABNORMAL LOW (ref 8.9–10.3)
GFR calc Af Amer: 4 mL/min — ABNORMAL LOW (ref >60)
GFR calc non Af Amer: 4 mL/min — ABNORMAL LOW (ref >60)
GLUCOSE: 93 mg/dL (ref 65–99)
Potassium: 4.1 mmol/L (ref 3.5–5.1)
Sodium: 138 mmol/L (ref 135–145)

## 2016-04-01 LAB — CBC
HCT: 25.2 % — ABNORMAL LOW (ref 39.0–52.0)
Hemoglobin: 8 g/dL — ABNORMAL LOW (ref 13.0–17.0)
MCH: 21.9 pg — AB (ref 26.0–34.0)
MCHC: 31.7 g/dL (ref 30.0–36.0)
MCV: 69 fL — AB (ref 78.0–100.0)
PLATELETS: 107 10*3/uL — AB (ref 150–400)
RBC: 3.65 MIL/uL — ABNORMAL LOW (ref 4.22–5.81)
RDW: 19.9 % — AB (ref 11.5–15.5)
WBC: 6 10*3/uL (ref 4.0–10.5)

## 2016-04-01 LAB — PTH, INTACT AND CALCIUM
Calcium, Total (PTH): 7.4 mg/dL — ABNORMAL LOW (ref 8.7–10.2)
PTH: 723 pg/mL — AB (ref 15–65)

## 2016-04-01 LAB — GLUCOSE, CAPILLARY: Glucose-Capillary: 292 mg/dL — ABNORMAL HIGH (ref 65–99)

## 2016-04-01 MED ORDER — CALCITRIOL 0.5 MCG PO CAPS
1.0000 ug | ORAL_CAPSULE | ORAL | Status: DC
  Administered 2016-04-01 – 2016-04-05 (×3): 1 ug via ORAL
  Filled 2016-04-01 (×3): qty 2

## 2016-04-01 MED ORDER — SODIUM CHLORIDE 0.9 % IV SOLN
125.0000 mg | Freq: Every day | INTRAVENOUS | Status: DC
  Administered 2016-04-01: 125 mg via INTRAVENOUS
  Filled 2016-04-01 (×2): qty 10

## 2016-04-01 MED ORDER — CARVEDILOL 25 MG PO TABS
25.0000 mg | ORAL_TABLET | Freq: Two times a day (BID) | ORAL | Status: DC
  Administered 2016-04-01 – 2016-04-07 (×10): 25 mg via ORAL
  Filled 2016-04-01 (×10): qty 1

## 2016-04-01 MED ORDER — SODIUM CHLORIDE 0.9 % IV SOLN
125.0000 mg | INTRAVENOUS | Status: DC | PRN
  Filled 2016-04-01: qty 10

## 2016-04-01 MED ORDER — CINACALCET HCL 30 MG PO TABS
60.0000 mg | ORAL_TABLET | Freq: Every day | ORAL | Status: DC
  Administered 2016-04-01 – 2016-04-07 (×6): 60 mg via ORAL
  Filled 2016-04-01 (×7): qty 2

## 2016-04-01 MED ORDER — AMLODIPINE BESYLATE 5 MG PO TABS
5.0000 mg | ORAL_TABLET | Freq: Every day | ORAL | Status: DC
  Administered 2016-04-01: 5 mg via ORAL
  Filled 2016-04-01: qty 1

## 2016-04-01 NOTE — Progress Notes (Signed)
Pharmacist Heart Failure Core Measure Documentation  Assessment: Cameron [name] has an EF documented as 35-40 on 01/18/2016 by echo.  Rationale: Heart failure patients with left ventricular systolic dysfunction (LVSD) and an EF < 40% should be prescribed an angiotensin converting enzyme inhibitor (ACEI) or angiotensin receptor blocker (ARB) at discharge unless a contraindication is documented in the medical record.  This patient is not currently on an ACEI or ARB for HF.  This note is being placed in the record in order to provide documentation that a contraindication to the use of these agents is present for this encounter.  ACE Inhibitor or Angiotensin Receptor Blocker is contraindicated (specify all that apply)  []   ACEI allergy AND ARB allergy []   Angioedema []   Moderate or severe aortic stenosis []   Hyperkalemia []   Hypotension []   Renal artery stenosis [x]   Worsening renal function, preexisting renal disease or dysfunction   Orson Rho 04/01/2016 3:41 PM

## 2016-04-01 NOTE — Progress Notes (Signed)
Subjective: Currently, the pt is feeling much improved. Swelling improved. No complaints. Will plan continued HD for diuresis. Vasc surgery following and will plan definitive access and tunneled temp access prior to DC.  Objective: Vital signs in last 24 hours: Vitals:   03/31/16 1900 03/31/16 2120 04/01/16 0016 04/01/16 0540  BP: (!) 161/107 (!) 158/115 (!) 151/108 (!) 156/120  Pulse:  87 90 93  Resp: 14 (!) 21 13 18   Temp:  98 F (36.7 C) 98 F (36.7 C) 98 F (36.7 C)  TempSrc:  Oral Oral Oral  SpO2:  100% 100% 100%  Weight:    97.9 kg (215 lb 14.4 oz)  Height:       24-hour weight change: Filed Weights   03/31/16 0819 03/31/16 1130 04/01/16 0540  Weight: 100.6 kg (221 lb 12.5 oz) 97.8 kg (215 lb 9.8 oz) 97.9 kg (215 lb 14.4 oz)   Dry Wt:  204 lbs after last admission in September 2017.  Intake/Output:  11/28 0701 - 11/29 0700 In: 480 [P.O.:480] Out: 2859     Physical Exam: Physical Exam  Constitutional: No distress.  Cardiovascular: Normal rate, regular rhythm, normal heart sounds and intact distal pulses.   Pulmonary/Chest: Effort normal and breath sounds normal. No respiratory distress. He has no wheezes. He has no rales.  Abdominal: Soft. Bowel sounds are normal. There is no tenderness.  Musculoskeletal: He exhibits edema (gross anasarca to the hips, mildly improved. 2+ pitting on hips/thighs, 3+ pitting on bilat LE).   Labs: CBC:  Recent Labs Lab 03/30/16 1009 03/30/16 1040 03/31/16 0317  WBC 6.2  --  6.0  NEUTROABS 5.3  --   --   HGB 9.1* 10.2* 8.6*  HCT 28.6* 30.0* 27.1*  MCV 68.6*  --  68.6*  PLT 128*  --  295*   Metabolic Panel:  Recent Labs Lab 03/30/16 1009 03/30/16 1033 03/30/16 1040 03/31/16 0317 03/31/16 0915  NA 135  --  134* 136  --   K 5.3*  --  5.3* 4.2  --   CL 105  --  108 103  --   CO2 10*  --   --  15*  --   GLUCOSE 97  --  93 129*  --   BUN 198*  --  >140* 146*  --   CREATININE 21.08*  --  >18.00* 17.10*  --   CALCIUM  7.9*  --   --  7.7* 7.4*  ALT 14*  --   --   --   --   ALKPHOS 73  --   --   --   --   BILITOT 0.7  --   --   --   --   PROT 6.5  --   --   --   --   ALBUMIN 3.4*  --   --   --   --   AMMONIA  --  68*  --   --   --    Cardiac Labs:  Recent Labs Lab 03/30/16 1009 03/30/16 1027  TROPIPOC  --  0.09*  BNP 1,563.1*  --      Medications: Infusions:  Scheduled Medications: . calcium acetate  2,001 mg Oral TID WC  . carvedilol  25 mg Oral BID WC  . heparin  5,000 Units Subcutaneous Q8H  . multivitamin  1 tablet Oral QHS   PRN Medications:   Assessment/Plan: [name] is a 45 y.o. male with PMH of HTN, HFrEF, CKD 5  who presents with anasarca and SOB which appears 2/2 acute on chronic renal failure.  1) Acute on CKD, stage 5: Pt with h/o renal failure and unknown baseline, SCr 12 during last hospitalization, 21 on presentation. Now s/p HD x 2 after emergent vasc-cath placement for management of uremia, AG acidosis, hyperkalemia, and hypervolemia.  Labs and anasarva improving. S/p vein mapping. Vascular Surg to plan permanent access. Nephrology following, appreciate recs. - Vasc Surg recs for definitive access and tunneled temp cath - Continue HD per Nephrology - daily BMP/CBC  2) HFrEF (EF 35-40%): Pt w/ recent echo in September, who discontinued his diuretics at home and presents with anasarca. No resp distress or signs of pulm edema on CXR. HF unclear whether ischemic or non-ischemic d/t chronic HTN and drug abuse. Previously prescribed carvedilol 25mg  BID, hydralazine 100mg  TID, Imdur 60mg  qD. - HD as above - Increase carvedilol to 25mg  BID  3) HTN: BP mildly elevated to 160s SBP. Increased carvedilol yesterday and added amlodipine 5mg .  Length of Stay: 2 day(s) Dispo: Anticipated discharge after uremia and anasarca managed successfully with HD. Pt will need outpt HD center established. Appreciate assistance of social work and care management in establishing patient  with appropriate resources for new start HD.  Holley Raring, MD Pager: [phone] (7AM-5PM) 04/01/2016, 7:22 AM

## 2016-04-01 NOTE — Progress Notes (Addendum)
04/01/2016 2:35 PM Hemodialysis Outpatient Note; this patient has been accepted at the [facility] on a Monday,Wednesday and Friday 2nd shift schedule. His first day he will need to be at the center for 11:00 AM.Thank you. Jenetta Downer. 2:38 PM I have asked financial advisors to check on insurance vs Medicaid availability.

## 2016-04-01 NOTE — Progress Notes (Signed)
Subjective: Interval History: has no complaint of still very swollen.  Objective: Vital signs in last 24 hours: Temp:  [98 F (36.7 C)-98.2 F (36.8 C)] 98.2 F (36.8 C) (11/29 0821) Pulse Rate:  [87-101] 93 (11/29 0540) Resp:  [13-21] 18 (11/29 0540) BP: (151-189)/(97-127) 156/120 (11/29 0540) SpO2:  [98 %-100 %] 100 % (11/29 0540) Weight:  [97.8 kg (215 lb 9.8 oz)-97.9 kg (215 lb 14.4 oz)] 97.9 kg (215 lb 14.4 oz) (11/29 0540) Weight change: -2.82 kg (-6 lb 3.5 oz)  Intake/Output from previous day: 11/28 0701 - 11/29 0700 In: 480 [P.O.:480] Out: 2859  Intake/Output this shift: Total I/O In: 240 [P.O.:240] Out: 150 [Urine:150]  General appearance: alert, cooperative and no distress Neck: R IJ cath Resp: rales bibasilar Cardio: S1, S2 normal and systolic murmur: holosystolic 2/6, blowing at apex GI: see below Extremities: obese, pos bs, soft  extem 4 + edema  Lab Results:  Recent Labs  03/31/16 0317 04/01/16 0725  WBC 6.0 6.0  HGB 8.6* 8.0*  HCT 27.1* 25.2*  PLT 143* 107*   BMET:  Recent Labs  03/31/16 0317 03/31/16 0915 04/01/16 0725  NA 136  --  138  K 4.2  --  4.1  CL 103  --  104  CO2 15*  --  17*  GLUCOSE 129*  --  93  BUN 146*  --  112*  CREATININE 17.10*  --  14.58*  CALCIUM 7.7* 7.4* 7.3*    Recent Labs  03/31/16 0915  PTH 723*  Comment   Iron Studies:  Recent Labs  03/31/16 0915  IRON 33*  TIBC 287    Studies/Results: Dg Chest 2 View  Result Date: 03/30/2016 CLINICAL DATA:  Chest pain and shortness of breath. Congestive heart failure. EXAM: CHEST  2 VIEW COMPARISON:  One-view chest x-ray 01/17/2016 FINDINGS: Lordotic angulation is noted. This exaggerates the heart size. There is no edema or effusion. No focal airspace disease is present. The visualized soft tissues and bony thorax are unremarkable. IMPRESSION: Cardiomegaly without failure. Electronically Signed   By: San Morelle M.D.   On: 03/30/2016 11:12   Dg Chest  Portable 1 View  Result Date: 03/30/2016 CLINICAL DATA:  Right central line insertion, check tip position EXAM: PORTABLE CHEST 1 VIEW COMPARISON:  03/30/2016 at 1101 hours FINDINGS: There is a right IJ catheter with tip in the expected location of the mid SVC. No pneumothorax. Stable cardiomegaly. No effusion nor overt pulmonary edema. IMPRESSION: Right IJ catheter tip in the mid SVC. No pneumothorax. Stable cardiomegaly. Electronically Signed   By: Ashley Royalty M.D.   On: 03/30/2016 17:42    I have reviewed the patient's current medications.  Assessment/Plan: 1 CKD5 vol xs for Hd again.  Will see how he does today and may do tomorrow 2 anemia add Fe 3 Obesity 4 HPTH vit D, cinn 5 NONADHERENCE 6 HTN add Norvasc P HD, iv Fe, bp med, lower vol    LOS: 2 days   Mariyam Remington L 04/01/2016,10:04 AM

## 2016-04-01 NOTE — Procedures (Signed)
I was present at this session.  I have reviewed the session itself and made appropriate changes.  HD via temp cath, vol xs , bp ^.    Rema Lievanos L 11/29/201712:44 PM

## 2016-04-02 DIAGNOSIS — I5020 Unspecified systolic (congestive) heart failure: Secondary | ICD-10-CM

## 2016-04-02 DIAGNOSIS — M10071 Idiopathic gout, right ankle and foot: Secondary | ICD-10-CM

## 2016-04-02 DIAGNOSIS — R2232 Localized swelling, mass and lump, left upper limb: Secondary | ICD-10-CM

## 2016-04-02 LAB — RENAL FUNCTION PANEL
ALBUMIN: 3.3 g/dL — AB (ref 3.5–5.0)
ANION GAP: 15 (ref 5–15)
BUN: 88 mg/dL — ABNORMAL HIGH (ref 6–20)
CALCIUM: 7.4 mg/dL — AB (ref 8.9–10.3)
CO2: 21 mmol/L — AB (ref 22–32)
CREATININE: 12.37 mg/dL — AB (ref 0.61–1.24)
Chloride: 102 mmol/L (ref 101–111)
GFR, EST AFRICAN AMERICAN: 5 mL/min — AB (ref >60)
GFR, EST NON AFRICAN AMERICAN: 4 mL/min — AB (ref >60)
Glucose, Bld: 114 mg/dL — ABNORMAL HIGH (ref 65–99)
PHOSPHORUS: 6.5 mg/dL — AB (ref 2.5–4.6)
Potassium: 4.2 mmol/L (ref 3.5–5.1)
SODIUM: 138 mmol/L (ref 135–145)

## 2016-04-02 LAB — URIC ACID: URIC ACID, SERUM: 6.7 mg/dL (ref 4.4–7.6)

## 2016-04-02 LAB — CBC
HCT: 27.2 % — ABNORMAL LOW (ref 39.0–52.0)
Hemoglobin: 8.4 g/dL — ABNORMAL LOW (ref 13.0–17.0)
MCH: 21.8 pg — AB (ref 26.0–34.0)
MCHC: 30.9 g/dL (ref 30.0–36.0)
MCV: 70.6 fL — ABNORMAL LOW (ref 78.0–100.0)
PLATELETS: 101 10*3/uL — AB (ref 150–400)
RBC: 3.85 MIL/uL — AB (ref 4.22–5.81)
RDW: 19.7 % — ABNORMAL HIGH (ref 11.5–15.5)
WBC: 6.6 10*3/uL (ref 4.0–10.5)

## 2016-04-02 LAB — GLUCOSE, CAPILLARY: GLUCOSE-CAPILLARY: 122 mg/dL — AB (ref 65–99)

## 2016-04-02 MED ORDER — PENTAFLUOROPROP-TETRAFLUOROETH EX AERO: 1.0000 "application " | INHALATION_SPRAY | CUTANEOUS | Status: DC | PRN

## 2016-04-02 MED ORDER — PREDNISONE 20 MG PO TABS
20.0000 mg | ORAL_TABLET | Freq: Every day | ORAL | Status: AC
  Administered 2016-04-02 – 2016-04-04 (×3): 20 mg via ORAL
  Filled 2016-04-02 (×3): qty 1

## 2016-04-02 MED ORDER — NA FERRIC GLUC CPLX IN SUCROSE 12.5 MG/ML IV SOLN
125.0000 mg | INTRAVENOUS | Status: DC
  Administered 2016-04-04 – 2016-04-06 (×2): 125 mg via INTRAVENOUS
  Filled 2016-04-02 (×7): qty 10

## 2016-04-02 MED ORDER — SODIUM CHLORIDE 0.9 % IV SOLN: 100.0000 mL | INTRAVENOUS | Status: DC | PRN

## 2016-04-02 MED ORDER — ACETAMINOPHEN 325 MG PO TABS
650.0000 mg | ORAL_TABLET | Freq: Four times a day (QID) | ORAL | Status: DC | PRN
  Administered 2016-04-02: 650 mg via ORAL
  Filled 2016-04-02: qty 2

## 2016-04-02 MED ORDER — HEPARIN SODIUM (PORCINE) 1000 UNIT/ML DIALYSIS: 1000.0000 [IU] | INTRAMUSCULAR | Status: DC | PRN

## 2016-04-02 MED ORDER — ALTEPLASE 2 MG IJ SOLR: 2.0000 mg | Freq: Once | INTRAMUSCULAR | Status: DC | PRN

## 2016-04-02 MED ORDER — OXYCODONE HCL 5 MG PO TABS
5.0000 mg | ORAL_TABLET | Freq: Four times a day (QID) | ORAL | Status: DC | PRN
  Administered 2016-04-02 – 2016-04-07 (×14): 5 mg via ORAL
  Filled 2016-04-02 (×15): qty 1

## 2016-04-02 MED ORDER — LIDOCAINE-PRILOCAINE 2.5-2.5 % EX CREA: 1.0000 "application " | TOPICAL_CREAM | CUTANEOUS | Status: DC | PRN

## 2016-04-02 MED ORDER — LIDOCAINE HCL (PF) 1 % IJ SOLN: 5.0000 mL | INTRAMUSCULAR | Status: DC | PRN

## 2016-04-02 MED ORDER — AMLODIPINE BESYLATE 10 MG PO TABS
10.0000 mg | ORAL_TABLET | Freq: Every day | ORAL | Status: DC
Start: 2016-04-02 — End: 2016-04-07
  Administered 2016-04-02 – 2016-04-06 (×5): 10 mg via ORAL
  Filled 2016-04-02 (×5): qty 1

## 2016-04-02 MED ORDER — SODIUM CHLORIDE 0.9 % IV SOLN
125.0000 mg | Freq: Once | INTRAVENOUS | Status: AC
  Administered 2016-04-02: 125 mg via INTRAVENOUS
  Filled 2016-04-02: qty 10

## 2016-04-02 MED ORDER — HEPARIN SODIUM (PORCINE) 1000 UNIT/ML DIALYSIS
40.0000 [IU]/kg | Freq: Once | INTRAMUSCULAR | Status: AC
  Administered 2016-04-02: 3800 [IU] via INTRAVENOUS_CENTRAL

## 2016-04-02 NOTE — Progress Notes (Signed)
Dialysis treatment completed.  4000 mL ultrafiltrated.  3500 mL net fluid removal.  Patient status unchanged. Lung sounds diminished to ausculation in all fields. BLE 3+ pitting edema. Cardiac: NSR.  Cleansed RIJ catheter with chlorhexidine.  Disconnected lines and flushed ports with saline per protocol.  Ports locked with heparin and capped per protocol.    Report given to bedside, RN Christy.

## 2016-04-02 NOTE — Progress Notes (Addendum)
Pt alerted nurse of pain in right great toe rated 8/10. No changes noted in appearance from previous assessment. Foot and toes swollen, +3 edema. Notified on-call for pain medication. Received order for Tylenol q6h. At this time, tylenol has been ineffective for patient. Will continue to monitor.

## 2016-04-02 NOTE — Progress Notes (Signed)
Patient arrived to unit by bed.  Reviewed treatment plan and this RN agrees with plan.  Report received from bedside RN, Alyse Low.  Consent verified.  Patient A & O X 4.   Lung sounds diminished to ausculation in all fields. BLE 3+ pitting edema. Cardiac:  NSR.  Removed caps and cleansed RIJ catheter with chlorhedxidine.  Aspirated ports of heparin and flushed them with saline per protocol.  Connected and secured lines, initiated treatment at 1516.  UF Goal of 4031mL and net fluid removal 3.5L.  Will continue to monitor.

## 2016-04-02 NOTE — Progress Notes (Signed)
Currently, the patient is scheduled for a left arm fistula and a tunneled dialysis catheter on Tuesday.  If a space opened up sooner, we will move him up.  Annamarie Major

## 2016-04-02 NOTE — Progress Notes (Signed)
Subjective: Interval History: has complaints still swollen.  Objective: Vital signs in last 24 hours: Temp:  [97.7 F (36.5 C)-99 F (37.2 C)] 98.6 F (37 C) (11/30 0746) Pulse Rate:  [78-106] 92 (11/30 0746) Resp:  [15-147] 147 (11/30 0746) BP: (126-190)/(74-138) 162/106 (11/30 0746) SpO2:  [96 %-98 %] 97 % (11/30 0746) Weight:  [95.8 kg (211 lb 3.2 oz)-97.2 kg (214 lb 4.6 oz)] 96.2 kg (212 lb) (11/30 0500) Weight change: -3.4 kg (-7 lb 7.9 oz)  Intake/Output from previous day: 11/29 0701 - 11/30 0700 In: 470 [P.O.:360; IV Piggyback:110] Out: 3150 [Urine:150] Intake/Output this shift: Total I/O In: 240 [P.O.:240] Out: -   General appearance: alert, cooperative and edematous Neck: RIJ cath Resp: diminished breath sounds bilaterally Cardio: S1, S2 normal, S4 present and systolic murmur: holosystolic 2/6, blowing at apex GI: obese,pos bs, liver down 6 cm Extremities: edema 3+  Lab Results:  Recent Labs  03/31/16 0317 04/01/16 0725  WBC 6.0 6.0  HGB 8.6* 8.0*  HCT 27.1* 25.2*  PLT 143* 107*   BMET:  Recent Labs  03/31/16 0317 03/31/16 0915 04/01/16 0725  NA 136  --  138  K 4.2  --  4.1  CL 103  --  104  CO2 15*  --  17*  GLUCOSE 129*  --  93  BUN 146*  --  112*  CREATININE 17.10*  --  14.58*  CALCIUM 7.7* 7.4* 7.3*    Recent Labs  03/31/16 0915  PTH 723*  Comment   Iron Studies:  Recent Labs  03/31/16 0915  IRON 33*  TIBC 287    Studies/Results: No results found.  I have reviewed the patient's current medications.  Assessment/Plan: 1 CKD 5 now HD dependent, vol xs will do HD today 2 Anemia Fe/esa 3 HPTH vit D, cinn 4 HTn lower vol, cont meds 5 NONADHERENCE P HD, access next week.  bp meds, esa, vit D, cinnacalcet     LOS: 3 days   Elver Stadler L 04/02/2016,11:18 AM

## 2016-04-02 NOTE — Progress Notes (Signed)
Received pt back from hemo dialysis to 3W39. Pt A&O x4. Vitals stable except BP 182/109. Pt requesting pain medication. Resuming care of patient for this shift. Barbie Haggis, RN.

## 2016-04-02 NOTE — Progress Notes (Signed)
Subjective: Currently, the pt is feeling much improved. Swelling improved. No complaints except great toe of R foot. H/o gout w/ tender MTP.  Objective: Vital signs in last 24 hours: Vitals:   04/02/16 0049 04/02/16 0500 04/02/16 0746 04/02/16 1205  BP: (!) 162/110 (!) 151/116 (!) 162/106 (!) 164/130  Pulse:  91 92 98  Resp: 16 15 14 16   Temp: 98.6 F (37 C) 98.4 F (36.9 C) 98.6 F (37 C) 98.2 F (36.8 C)  TempSrc: Oral Oral Oral Oral  SpO2: 98% 96% 97% 97%  Weight:  96.2 kg (212 lb)    Height:       24-hour weight change: Filed Weights   04/01/16 1230 04/01/16 1551 04/02/16 0500  Weight: 97.2 kg (214 lb 4.6 oz) 95.8 kg (211 lb 3.2 oz) 96.2 kg (212 lb)   Dry Wt:  204 lbs after last admission in September 2017.  Intake/Output:  11/29 0701 - 11/30 0700 In: 62 [P.O.:360; IV Piggyback:110] Out: 3150 [Urine:150]    Physical Exam: Physical Exam  Constitutional: No distress.  Cardiovascular: Normal rate, regular rhythm, normal heart sounds and intact distal pulses.   Pulmonary/Chest: Effort normal and breath sounds normal. No respiratory distress. He has no wheezes. He has no rales.  Abdominal: Soft. Bowel sounds are normal. There is no tenderness.  Musculoskeletal: He exhibits edema (Improving, still with 3+ BLE, 1+ sacral) and tenderness (right great toe at MTP). He exhibits no deformity.  Skin: Skin is warm and dry.   Labs: CBC:  Recent Labs Lab 03/30/16 1009 03/30/16 1040 03/31/16 0317 04/01/16 0725  WBC 6.2  --  6.0 6.0  NEUTROABS 5.3  --   --   --   HGB 9.1* 10.2* 8.6* 8.0*  HCT 28.6* 30.0* 27.1* 25.2*  MCV 68.6*  --  68.6* 69.0*  PLT 128*  --  188* 416*   Metabolic Panel:  Recent Labs Lab 03/30/16 1009 03/30/16 1033 03/30/16 1040 03/31/16 0317 03/31/16 0915 04/01/16 0725  NA 135  --  134* 136  --  138  K 5.3*  --  5.3* 4.2  --  4.1  CL 105  --  108 103  --  104  CO2 10*  --   --  15*  --  17*  GLUCOSE 97  --  93 129*  --  93  BUN 198*  --   >140* 146*  --  112*  CREATININE 21.08*  --  >18.00* 17.10*  --  14.58*  CALCIUM 7.9*  --   --  7.7* 7.4* 7.3*  ALT 14*  --   --   --   --   --   ALKPHOS 73  --   --   --   --   --   BILITOT 0.7  --   --   --   --   --   PROT 6.5  --   --   --   --   --   ALBUMIN 3.4*  --   --   --   --   --   AMMONIA  --  68*  --   --   --   --    Cardiac Labs:  Recent Labs Lab 03/30/16 1009 03/30/16 1027  TROPIPOC  --  0.09*  BNP 1,563.1*  --      Medications: Infusions:  Scheduled Medications: . amLODipine  5 mg Oral QHS  . calcitRIOL  1 mcg Oral QODAY  . calcium acetate  2,001 mg Oral TID WC  . carvedilol  25 mg Oral BID WC  . cinacalcet  60 mg Oral Q supper  . ferric gluconate (FERRLECIT/NULECIT) IV  125 mg Intravenous Once  . [START ON 04/03/2016] ferric gluconate (FERRLECIT/NULECIT) IV  125 mg Intravenous Q M,W,F-HD  . heparin  5,000 Units Subcutaneous Q8H  . multivitamin  1 tablet Oral QHS  . predniSONE  20 mg Oral Q breakfast   PRN Medications:   Assessment/Plan: [name] is a 45 y.o. male with PMH of HTN, HFrEF, CKD 5 who presents with anasarca and SOB which appears 2/2 acute on chronic renal failure.  1) Acute on CKD, stage 5: Pt with h/o renal failure and unknown baseline, SCr 12 during last hospitalization, 21 on presentation. Now s/p daily HD after emergent vasc-cath placement for management of uremia, AG acidosis, hyperkalemia, and hypervolemia.  Labs and anasarca improving. S/p vein mapping, Vascular Surg to plan fistula in left arm and tunneled vasc-cath on 12/5. Pt accepted at [facility] on a Monday,Wednesday and Friday 2nd shift schedule. Nephrology following. - Continue HD per Nephrology - daily BMP/CBC  2) HFrEF (EF 35-40%): Pt w/ recent echo in September, who discontinued his diuretics at home and presents with anasarca. No resp distress or signs of pulm edema on CXR. HF unclear whether ischemic or non-ischemic d/t chronic HTN and drug abuse.  Previously prescribed carvedilol 25mg  BID, hydralazine 100mg  TID, Imdur 60mg  qD. - HD as above - Cont. carvedilol 25mg  BID  3) HTN: BP mildly elevated to 160s SBP. Cont carvedilol 25mg  and increase amlodipine to 10mg .  4) Gout: TTP over MTP of R great toe. Pt w/ hx. Will treat with prednisone 40mg  qD x 4d given renal disease. - pred 20mg  qD x 4 days  Length of Stay: 3 day(s) Dispo: Anticipated discharge after uremia and anasarca managed successfully with HD. Will have vasc-cath tunneled and permanent access prepared on 12/5, will likely be stable for discharge following.  Holley Raring, MD Pager: [phone] (7AM-5PM) 04/02/2016, 12:53 PM

## 2016-04-03 DIAGNOSIS — I10 Essential (primary) hypertension: Secondary | ICD-10-CM | POA: Diagnosis present

## 2016-04-03 DIAGNOSIS — M109 Gout, unspecified: Secondary | ICD-10-CM | POA: Diagnosis present

## 2016-04-03 MED ORDER — HYDRALAZINE HCL 25 MG PO TABS
25.0000 mg | ORAL_TABLET | Freq: Three times a day (TID) | ORAL | Status: DC
  Administered 2016-04-03 – 2016-04-04 (×3): 25 mg via ORAL
  Filled 2016-04-03 (×6): qty 1

## 2016-04-03 NOTE — Progress Notes (Signed)
Nutrition Education Note   RD consulted for Renal diet education. Patient is new to HD. Provided Renal Pyramid Handout to patient/family. Reviewed food groups and provided written recommended serving sizes specifically determined for patient's current nutritional status.   Explained why diet restrictions are needed and provided lists of foods to limit/avoid that are high potassium, sodium, and phosphorus. Provided specific recommendations on safer alternatives of these foods. Strongly encouraged compliance of this diet.   Discussed importance of protein intake at each meal and snack. Provided examples of how to maximize protein intake throughout the day. Discussed need for fluid restriction with dialysis, importance of minimizing weight gain between HD treatments, and renal-friendly beverage options.  Encouraged pt to discuss specific diet questions/concerns with RD at HD outpatient facility. Teach back method used.  Expect fair to poor compliance.  Body mass index is 34.22 kg/m. Pt meets criteria for class 1 obesity based on current BMI.  Current diet order is Renal, patient is consuming approximately 100% of meals at this time. Labs and medications reviewed. No further nutrition interventions warranted at this time. RD contact information provided. If additional nutrition issues arise, please re-consult RD.  Molli Barrows, RD, LDN, [place] Pager [phone] After Hours Pager [phone]

## 2016-04-03 NOTE — Progress Notes (Signed)
Subjective: Currently, the pt is feeling well. No complaints. Toe improved. Requests advise for Disability Paperwork given new ESRD and HD which will affect his ability to work.  Objective: Vital signs in last 24 hours: Vitals:   04/02/16 1918 04/02/16 1929 04/02/16 2100 04/03/16 0500  BP: (!) 175/119 (!) 182/109 (!) 182/109 (!) 160/105  Pulse: 93 93 93 95  Resp:  19 19 18   Temp:  98 F (36.7 C) 98 F (36.7 C) 98.4 F (36.9 C)  TempSrc:  Oral    SpO2:  95% 95% 98%  Weight:    187 lb 1.6 oz (84.9 kg)  Height:       24-hour weight change: Filed Weights   04/02/16 1501 04/02/16 1916 04/03/16 0500  Weight: 212 lb 4.9 oz (96.3 kg) 204 lb 9.4 oz (92.8 kg) 187 lb 1.6 oz (84.9 kg)   Dry Wt:  204 lbs after last admission in September 2017.  Intake/Output:  11/30 0701 - 12/01 0700 In: 840 [P.O.:840] Out: 3500     Physical Exam: Physical Exam  Constitutional: No distress.  Cardiovascular: Normal rate, regular rhythm, normal heart sounds and intact distal pulses.   Pulmonary/Chest: Effort normal and breath sounds normal. No respiratory distress. He has no wheezes. He has no rales.  Abdominal: Soft. Bowel sounds are normal. There is no tenderness.  Musculoskeletal: He exhibits edema (Improving, still with 3+ BLE, 1+ sacral) and tenderness (right great toe at MTP). He exhibits no deformity.  Skin: Skin is warm and dry.   Labs: CBC:  Recent Labs Lab 03/30/16 1009 03/30/16 1040 03/31/16 0317 04/01/16 0725 04/02/16 1529  WBC 6.2  --  6.0 6.0 6.6  NEUTROABS 5.3  --   --   --   --   HGB 9.1* 10.2* 8.6* 8.0* 8.4*  HCT 28.6* 30.0* 27.1* 25.2* 27.2*  MCV 68.6*  --  68.6* 69.0* 70.6*  PLT 128*  --  143* 810* 175*   Metabolic Panel:  Recent Labs Lab 03/30/16 1009 03/30/16 1033 03/30/16 1040 03/31/16 0317 03/31/16 0915 04/01/16 0725 04/02/16 1528  NA 135  --  134* 136  --  138 138  K 5.3*  --  5.3* 4.2  --  4.1 4.2  CL 105  --  108 103  --  104 102  CO2 10*  --   --   15*  --  17* 21*  GLUCOSE 97  --  93 129*  --  93 114*  BUN 198*  --  >140* 146*  --  112* 88*  CREATININE 21.08*  --  >18.00* 17.10*  --  14.58* 12.37*  CALCIUM 7.9*  --   --  7.7* 7.4* 7.3* 7.4*  PHOS  --   --   --   --   --   --  6.5*  ALT 14*  --   --   --   --   --   --   ALKPHOS 73  --   --   --   --   --   --   BILITOT 0.7  --   --   --   --   --   --   PROT 6.5  --   --   --   --   --   --   ALBUMIN 3.4*  --   --   --   --   --  3.3*  AMMONIA  --  68*  --   --   --   --   --  Cardiac Labs:  Recent Labs Lab 03/30/16 1009 03/30/16 1027  TROPIPOC  --  0.09*  BNP 1,563.1*  --      Medications: Infusions:  Scheduled Medications: . amLODipine  10 mg Oral QHS  . calcitRIOL  1 mcg Oral QODAY  . calcium acetate  2,001 mg Oral TID WC  . carvedilol  25 mg Oral BID WC  . cinacalcet  60 mg Oral Q supper  . ferric gluconate (FERRLECIT/NULECIT) IV  125 mg Intravenous Q M,W,F-HD  . heparin  5,000 Units Subcutaneous Q8H  . multivitamin  1 tablet Oral QHS  . predniSONE  20 mg Oral Q breakfast   PRN Medications:   Assessment/Plan: Cameron [name] is a 45 y.o. male with PMH of HTN, HFrEF, CKD 5 who presents with anasarca and SOB which appears 2/2 acute on chronic renal failure.  1) Acute on CKD, stage 5: Pt with h/o renal failure and unknown baseline, Now s/p daily HD after emergent vasc-cath placement. Labs and anasarca improving. S/p vein mapping, Vascular Surg to plan fistula in left arm and tunneled vasc-cath on 12/5. Pt accepted at [facility] on a Monday,Wednesday and Friday 2nd shift schedule. Nephrology following. - Continue HD per Nephrology - daily BMP/CBC  2) HFrEF (EF 35-40%): Stable. HF unclear whether ischemic or non-ischemic d/t chronic HTN and drug abuse. Previously prescribed carvedilol 25mg  BID, hydralazine 100mg  TID, Imdur 60mg  qD. - HD as above - Cont. carvedilol 25mg  BID  3) HTN: BP mildly elevated to 160s SBP. Cont carvedilol 25mg  and  continue amlodipine to 10mg , adding hydralizine 25mg  TID will titrate up as needed.  4) Gout: Improved. TTP over MTP of R great toe. Pt w/ hx. Will treat with prednisone 40mg  qD x 4d given renal disease. - pred 20mg  qD x 4 days - oxy 5 q6h PRN pain  Length of Stay: 4 day(s) Dispo: Anticipated discharge after uremia and anasarca managed successfully with HD. Will have vasc-cath tunneled and permanent access prepared on 12/5, will likely be stable for discharge following.  Cameron Raring, MD Pager: [phone] (7AM-5PM) 04/03/2016, 8:06 AM

## 2016-04-03 NOTE — Progress Notes (Signed)
Subjective: Interval History: has no complaint, feels a lot better.  Objective: Vital signs in last 24 hours: Temp:  [97.9 F (36.6 C)-98.4 F (36.9 C)] 98.4 F (36.9 C) (12/01 0500) Pulse Rate:  [89-95] 95 (12/01 0500) Resp:  [15-19] 18 (12/01 0500) BP: (154-205)/(105-131) 160/105 (12/01 0500) SpO2:  [95 %-98 %] 98 % (12/01 0500) Weight:  [84.9 kg (187 lb 1.6 oz)-96.3 kg (212 lb 4.9 oz)] 84.9 kg (187 lb 1.6 oz) (12/01 0500) Weight change: -0.9 kg (-1 lb 15.7 oz)  Intake/Output from previous day: 11/30 0701 - 12/01 0700 In: 840 [P.O.:840] Out: 3500  Intake/Output this shift: Total I/O In: 240 [P.O.:240] Out: 0   General appearance: alert, cooperative, no distress and moderately obese Neck: below Cardio: RIJ cath GI: obese, pos bs,liver down 6 cm Extremities: edema 3+  Lab Results:  Recent Labs  04/01/16 0725 04/02/16 1529  WBC 6.0 6.6  HGB 8.0* 8.4*  HCT 25.2* 27.2*  PLT 107* 101*   BMET:  Recent Labs  04/01/16 0725 04/02/16 1528  NA 138 138  K 4.1 4.2  CL 104 102  CO2 17* 21*  GLUCOSE 93 114*  BUN 112* 88*  CREATININE 14.58* 12.37*  CALCIUM 7.3* 7.4*   No results for input(s): PTH in the last 72 hours. Iron Studies: No results for input(s): IRON, TIBC, TRANSFERRIN, FERRITIN in the last 72 hours.  Studies/Results: No results found.  I have reviewed the patient's current medications.  Assessment/Plan: 1 ESRD will do HD in am.  Set up for access next week.  Still vol xs . Solute better 2 HTN lower vol , on meds 3 Anemia on esa 4 HPTH vit D, cinn 5 NONADHERENCE P HD, perm cath and access. Esa, vit D, BPmeds    LOS: 4 days   Daniyal Tabor L 04/03/2016,12:54 PM

## 2016-04-04 LAB — CBC
HCT: 25.3 % — ABNORMAL LOW (ref 39.0–52.0)
Hemoglobin: 7.7 g/dL — ABNORMAL LOW (ref 13.0–17.0)
MCH: 21.8 pg — AB (ref 26.0–34.0)
MCHC: 30.4 g/dL (ref 30.0–36.0)
MCV: 71.7 fL — ABNORMAL LOW (ref 78.0–100.0)
PLATELETS: 130 10*3/uL — AB (ref 150–400)
RBC: 3.53 MIL/uL — ABNORMAL LOW (ref 4.22–5.81)
RDW: 19.1 % — AB (ref 11.5–15.5)
WBC: 7.5 10*3/uL (ref 4.0–10.5)

## 2016-04-04 LAB — COMPREHENSIVE METABOLIC PANEL
ALBUMIN: 2.8 g/dL — AB (ref 3.5–5.0)
ALK PHOS: 92 U/L (ref 38–126)
ALT: 16 U/L — ABNORMAL LOW (ref 17–63)
ANION GAP: 12 (ref 5–15)
AST: 17 U/L (ref 15–41)
BUN: 62 mg/dL — ABNORMAL HIGH (ref 6–20)
CALCIUM: 7.9 mg/dL — AB (ref 8.9–10.3)
CO2: 25 mmol/L (ref 22–32)
Chloride: 98 mmol/L — ABNORMAL LOW (ref 101–111)
Creatinine, Ser: 9.57 mg/dL — ABNORMAL HIGH (ref 0.61–1.24)
GFR calc non Af Amer: 6 mL/min — ABNORMAL LOW (ref >60)
GFR, EST AFRICAN AMERICAN: 7 mL/min — AB (ref >60)
GLUCOSE: 138 mg/dL — AB (ref 65–99)
POTASSIUM: 3.8 mmol/L (ref 3.5–5.1)
SODIUM: 135 mmol/L (ref 135–145)
Total Bilirubin: 0.4 mg/dL (ref 0.3–1.2)
Total Protein: 5.9 g/dL — ABNORMAL LOW (ref 6.5–8.1)

## 2016-04-04 MED ORDER — HYDRALAZINE HCL 50 MG PO TABS
50.0000 mg | ORAL_TABLET | Freq: Three times a day (TID) | ORAL | Status: DC
  Administered 2016-04-04 – 2016-04-07 (×9): 50 mg via ORAL
  Filled 2016-04-04 (×8): qty 1

## 2016-04-04 MED ORDER — PROMETHAZINE HCL 25 MG PO TABS: 12.5000 mg | ORAL_TABLET | Freq: Three times a day (TID) | ORAL | Status: DC | PRN

## 2016-04-04 NOTE — Progress Notes (Signed)
Subjective: Currently, the pt is feeling well. C/o nausea. Will address with phenergan.  Objective: Vital signs in last 24 hours: Vitals:   04/04/16 1100 04/04/16 1130 04/04/16 1157 04/04/16 1225  BP: (!) 150/95 (!) 158/103 (!) 154/101 (!) 174/114  Pulse: 96 93 93 93  Resp:   18 18  Temp:    98.1 F (36.7 C)  TempSrc:    Oral  SpO2:   96% 100%  Weight:   197 lb 1.5 oz (89.4 kg)   Height:       24-hour weight change: Filed Weights   04/04/16 0606 04/04/16 0738 04/04/16 1157  Weight: 205 lb 12.8 oz (93.4 kg) 206 lb 12.7 oz (93.8 kg) 197 lb 1.5 oz (89.4 kg)   Dry Wt:  204 lbs after last admission in September 2017.  Intake/Output:  12/01 0701 - 12/02 0700 In: 600 [P.O.:600] Out: 75 [Urine:75]    Physical Exam: Physical Exam  Constitutional: No distress.  Cardiovascular: Normal rate, regular rhythm, normal heart sounds and intact distal pulses.   Pulmonary/Chest: Effort normal and breath sounds normal. No respiratory distress. He has no wheezes. He has no rales.  Abdominal: Soft. Bowel sounds are normal. There is no tenderness.  Musculoskeletal: He exhibits edema (Improving, still with 3+ BLE, 1+ sacral) and tenderness (right great toe at MTP). He exhibits no deformity.  Skin: Skin is warm and dry.   Labs: CBC:  Recent Labs Lab 03/30/16 1009 03/30/16 1040 03/31/16 0317 04/01/16 0725 04/02/16 1529 04/04/16 0756  WBC 6.2  --  6.0 6.0 6.6 7.5  NEUTROABS 5.3  --   --   --   --   --   HGB 9.1* 10.2* 8.6* 8.0* 8.4* 7.7*  HCT 28.6* 30.0* 27.1* 25.2* 27.2* 25.3*  MCV 68.6*  --  68.6* 69.0* 70.6* 71.7*  PLT 128*  --  143* 107* 378* 588*   Metabolic Panel:  Recent Labs Lab 03/30/16 1009 03/30/16 1033 03/30/16 1040 03/31/16 0317  04/01/16 0725 04/02/16 1528 04/04/16 0756  NA 135  --  134* 136  --  138 138 135  K 5.3*  --  5.3* 4.2  --  4.1 4.2 3.8  CL 105  --  108 103  --  104 102 98*  CO2 10*  --   --  15*  --  17* 21* 25  GLUCOSE 97  --  93 129*  --  93  114* 138*  BUN 198*  --  >140* 146*  --  112* 88* 62*  CREATININE 21.08*  --  >18.00* 17.10*  --  14.58* 12.37* 9.57*  CALCIUM 7.9*  --   --  7.7*  < > 7.3* 7.4* 7.9*  PHOS  --   --   --   --   --   --  6.5*  --   ALT 14*  --   --   --   --   --   --  16*  ALKPHOS 73  --   --   --   --   --   --  92  BILITOT 0.7  --   --   --   --   --   --  0.4  PROT 6.5  --   --   --   --   --   --  5.9*  ALBUMIN 3.4*  --   --   --   --   --  3.3* 2.8*  AMMONIA  --  68*  --   --   --   --   --   --   < > = values in this interval not displayed. Cardiac Labs:  Recent Labs Lab 03/30/16 1009 03/30/16 1027  TROPIPOC  --  0.09*  BNP 1,563.1*  --      Medications: Infusions:  Scheduled Medications: . amLODipine  10 mg Oral QHS  . calcitRIOL  1 mcg Oral QODAY  . calcium acetate  2,001 mg Oral TID WC  . carvedilol  25 mg Oral BID WC  . cinacalcet  60 mg Oral Q supper  . ferric gluconate (FERRLECIT/NULECIT) IV  125 mg Intravenous Q M,W,F-HD  . heparin  5,000 Units Subcutaneous Q8H  . hydrALAZINE  25 mg Oral Q8H  . multivitamin  1 tablet Oral QHS  . predniSONE  20 mg Oral Q breakfast   PRN Medications:   Assessment/Plan: [name] is a 45 y.o. male with PMH of HTN, HFrEF, CKD 5 who presents with anasarca and SOB which appears 2/2 acute on chronic renal failure.  1) Acute on CKD, stage 5: Pt with h/o renal failure and unknown baseline, Now s/p daily HD after emergent vasc-cath placement. Labs and anasarca improving. S/p vein mapping, Vascular Surg to plan fistula in left arm and tunneled vasc-cath on 12/5. Pt accepted at [facility] on a Monday,Wednesday and Friday 2nd shift schedule. Nephrology following. - Continue HD per Nephrology - daily BMP/CBC  2) HFrEF (EF 35-40%): Stable. HF unclear whether ischemic or non-ischemic d/t chronic HTN and drug abuse. Previously prescribed carvedilol 25mg  BID, hydralazine 100mg  TID, Imdur 60mg  qD. - HD as above - Cont. carvedilol  25mg  BID  3) HTN: BP mildly elevated to 160s SBP. Cont carvedilol 25mg  and continue amlodipine to 10mg , increase hydralizine 50mg  TID.  4) Gout: Improved. TTP over MTP of R great toe. Pt w/ hx. Will treat with prednisone 40mg  qD x 4d given renal disease. - pred 20mg  qD x 4 days - oxy 5 q6h PRN pain  Length of Stay: 5 day(s) Dispo: Anticipated discharge after uremia and anasarca managed successfully with HD. Will have vasc-cath tunneled and permanent access prepared on 12/5, will likely be stable for discharge following.  Holley Raring, MD Pager: [phone] (7AM-5PM) 04/04/2016, 12:39 PM

## 2016-04-04 NOTE — Procedures (Signed)
I was present at this session.  I have reviewed the session itself and made appropriate changes.  HD via temp cath. 400 BFR.  tol well. bp ^ to start,vol xs  Cameron [name] L 12/2/20178:23 AM

## 2016-04-04 NOTE — Progress Notes (Addendum)
New Admission Note: Transfer from 3W29  Arrival Method: bed Mental Orientation: a/o x4  Telemetry:None Assessment: Completed Skin: clean dry intact IV:RFA Pain:none Tubes:R HD chest catheter Safety Measures: Safety Fall Prevention Plan has been given, discussed and signed Admission: Completed Unit Orientation: Patient has been orientated to the room, unit and staff.  Family:nonw present patient belongings were given to him after coming back from HD  Orders have been reviewed and implemented. Will continue to monitor the patient. Call light has been placed within reach and bed alarm has been activated.   Retta Mac BSN, RN

## 2016-04-04 NOTE — Progress Notes (Signed)
Subjective: Interval History: has no complaint .  Objective: Vital signs in last 24 hours: Temp:  [98.4 F (36.9 C)-98.7 F (37.1 C)] 98.7 F (37.1 C) (12/02 0606) Pulse Rate:  [91-98] 92 (12/02 0606) Resp:  [15-18] 15 (12/02 0606) BP: (155-167)/(101-107) 155/104 (12/02 0606) SpO2:  [98 %-99 %] 99 % (12/02 0606) Weight:  [93.4 kg (205 lb 12.8 oz)] 93.4 kg (205 lb 12.8 oz) (12/02 0606) Weight change: -2.95 kg (-6 lb 8.1 oz)  Intake/Output from previous day: 12/01 0701 - 12/02 0700 In: 600 [P.O.:600] Out: 75 [Urine:75] Intake/Output this shift: No intake/output data recorded.  General appearance: alert, cooperative, no distress and moderately obese Neck: RIJ cath Resp: rales bibasilar Cardio: S1, S2 normal and systolic murmur: holosystolic 2/6, blowing at apex GI: obese, pos bs, soft, liver down  6 cm Extremities: edema 3+ edema  Lab Results:  Recent Labs  04/02/16 1529 04/04/16 0756  WBC 6.6 7.5  HGB 8.4* 7.7*  HCT 27.2* 25.3*  PLT 101* PENDING   BMET:  Recent Labs  04/02/16 1528  NA 138  K 4.2  CL 102  CO2 21*  GLUCOSE 114*  BUN 88*  CREATININE 12.37*  CALCIUM 7.4*   No results for input(s): PTH in the last 72 hours. Iron Studies: No results for input(s): IRON, TIBC, TRANSFERRIN, FERRITIN in the last 72 hours.  Studies/Results: No results found.  I have reviewed the patient's current medications.  Assessment/Plan: 1 CKD 5 now ESRD vol xs. On Hd, lower vol.  Solute.  2 Obesity 3 HTN lower with lower vol 4 Anemia esa/fe 5 HPTH vit D,cinn 6 ??low platelet P HD, esa, vit D, check Platelets     LOS: 5 days   Cameron [name] L 04/04/2016,8:24 AM

## 2016-04-05 LAB — BASIC METABOLIC PANEL
ANION GAP: 11 (ref 5–15)
BUN: 40 mg/dL — ABNORMAL HIGH (ref 6–20)
CALCIUM: 8 mg/dL — AB (ref 8.9–10.3)
CO2: 26 mmol/L (ref 22–32)
Chloride: 100 mmol/L — ABNORMAL LOW (ref 101–111)
Creatinine, Ser: 6.71 mg/dL — ABNORMAL HIGH (ref 0.61–1.24)
GFR calc Af Amer: 11 mL/min — ABNORMAL LOW (ref >60)
GFR, EST NON AFRICAN AMERICAN: 9 mL/min — AB (ref >60)
GLUCOSE: 111 mg/dL — AB (ref 65–99)
POTASSIUM: 3.9 mmol/L (ref 3.5–5.1)
SODIUM: 137 mmol/L (ref 135–145)

## 2016-04-05 NOTE — Progress Notes (Signed)
Subjective: Interval History: has complaints wants to cont to get fluid down, still swollen.  Needs to see SW.  Objective: Vital signs in last 24 hours: Temp:  [97.8 F (36.6 C)-98.6 F (37 C)] 98.5 F (36.9 C) (12/03 1024) Pulse Rate:  [84-103] 94 (12/03 1024) Resp:  [17-18] 18 (12/03 1024) BP: (136-174)/(88-114) 149/97 (12/03 1024) SpO2:  [95 %-100 %] 100 % (12/03 1024) Weight:  [89.4 kg (197 lb 1.5 oz)-90.5 kg (199 lb 9.6 oz)] 90.5 kg (199 lb 9.6 oz) (12/02 2100) Weight change: 0.45 kg (15.9 oz)  Intake/Output from previous day: 12/02 0701 - 12/03 0700 In: 880 [P.O.:880] Out: 4000  Intake/Output this shift: No intake/output data recorded.  General appearance: alert, cooperative, no distress and moderately obese Neck: RIJ cath Resp: rales bibasilar Cardio: S1, S2 normal and systolic murmur: holosystolic 2/6, blowing at apex GI: obese, pos bs, soft, liver down 7 cm Extremities: edema 2-3+  Lab Results:  Recent Labs  04/02/16 1529 04/04/16 0756  WBC 6.6 7.5  HGB 8.4* 7.7*  HCT 27.2* 25.3*  PLT 101* 130*   BMET:  Recent Labs  04/04/16 0756 04/05/16 0607  NA 135 137  K 3.8 3.9  CL 98* 100*  CO2 25 26  GLUCOSE 138* 111*  BUN 62* 40*  CREATININE 9.57* 6.71*  CALCIUM 7.9* 8.0*   No results for input(s): PTH in the last 72 hours. Iron Studies: No results for input(s): IRON, TIBC, TRANSFERRIN, FERRITIN in the last 72 hours.  Studies/Results: No results found.  I have reviewed the patient's current medications.  Assessment/Plan: 1 ESRD will do HD in am for vol 2 HTN better cont to lower vol 3 Anemia on esa/Fe 4 HPTH vit D, cinn 5 obesity 6 NONADHERENCE P HD, esa, SW to see, vit D, cinn    LOS: 6 days   Trei Schoch L 04/05/2016,10:50 AM

## 2016-04-05 NOTE — Progress Notes (Signed)
Subjective: Currently, the pt is feeling well. Denies having any nausea.  Objective: Vital signs in last 24 hours: Vitals:   04/04/16 1225 04/04/16 1701 04/04/16 2100 04/05/16 0537  BP: (!) 174/114 (!) 163/114 136/88 (!) 154/90  Pulse: 93 (!) 103 84 99  Resp: 18 17 17 17   Temp: 98.1 F (36.7 C) 97.8 F (36.6 C) 98.3 F (36.8 C) 98.6 F (37 C)  TempSrc: Oral Oral Oral Oral  SpO2: 100% 98% 95% 100%  Weight:   199 lb 9.6 oz (90.5 kg)   Height:       24-hour weight change: Filed Weights   04/04/16 0738 04/04/16 1157 04/04/16 2100  Weight: 206 lb 12.7 oz (93.8 kg) 197 lb 1.5 oz (89.4 kg) 199 lb 9.6 oz (90.5 kg)   Dry Wt:  204 lbs after last admission in September 2017.  Intake/Output:  12/02 0701 - 12/03 0700 In: 3 [P.O.:880] Out: 4000     Physical Exam: Physical Exam  Constitutional: He is oriented to person, place, and time. He appears well-developed and well-nourished. Cameron [name] distress.  Sitting on bedside chair   Cardiovascular: Normal rate, regular rhythm and intact distal pulses.   Pulmonary/Chest: Effort normal and breath sounds normal. Cameron [name] respiratory distress. He has Cameron [name] wheezes. He has Cameron [name] rales.  Abdominal: Soft. Bowel sounds are normal. He exhibits Cameron [name] distension. There is Cameron [name] tenderness.  Musculoskeletal: He exhibits edema.  +2 pitting edema of b/l LEs  Neurological: He is alert and oriented to person, place, and time.  Skin: Skin is warm and dry.   Labs: CBC:  Recent Labs Lab 03/30/16 1009 03/30/16 1040 03/31/16 0317 04/01/16 0725 04/02/16 1529 04/04/16 0756  WBC 6.2  --  6.0 6.0 6.6 7.5  NEUTROABS 5.3  --   --   --   --   --   HGB 9.1* 10.2* 8.6* 8.0* 8.4* 7.7*  HCT 28.6* 30.0* 27.1* 25.2* 27.2* 25.3*  MCV 68.6*  --  68.6* 69.0* 70.6* 71.7*  PLT 128*  --  143* 107* 790* 240*   Metabolic Panel:  Recent Labs Lab 03/30/16 1009 03/30/16 1033 03/30/16 1040 03/31/16 0317  04/01/16 0725 04/02/16 1528 04/04/16 0756  NA 135  --  134* 136  --  138  138 135  K 5.3*  --  5.3* 4.2  --  4.1 4.2 3.8  CL 105  --  108 103  --  104 102 98*  CO2 10*  --   --  15*  --  17* 21* 25  GLUCOSE 97  --  93 129*  --  93 114* 138*  BUN 198*  --  >140* 146*  --  112* 88* 62*  CREATININE 21.08*  --  >18.00* 17.10*  --  14.58* 12.37* 9.57*  CALCIUM 7.9*  --   --  7.7*  < > 7.3* 7.4* 7.9*  PHOS  --   --   --   --   --   --  6.5*  --   ALT 14*  --   --   --   --   --   --  16*  ALKPHOS 73  --   --   --   --   --   --  92  BILITOT 0.7  --   --   --   --   --   --  0.4  PROT 6.5  --   --   --   --   --   --  5.9*  ALBUMIN 3.4*  --   --   --   --   --  3.3* 2.8*  AMMONIA  --  68*  --   --   --   --   --   --   < > = values in this interval not displayed. Cardiac Labs:  Recent Labs Lab 03/30/16 1009 03/30/16 1027  TROPIPOC  --  0.09*  BNP 1,563.1*  --      Medications: Infusions:  Scheduled Medications: . amLODipine  10 mg Oral QHS  . calcitRIOL  1 mcg Oral QODAY  . calcium acetate  2,001 mg Oral TID WC  . carvedilol  25 mg Oral BID WC  . cinacalcet  60 mg Oral Q supper  . ferric gluconate (FERRLECIT/NULECIT) IV  125 mg Intravenous Q M,W,F-HD  . heparin  5,000 Units Subcutaneous Q8H  . hydrALAZINE  50 mg Oral Q8H  . multivitamin  1 tablet Oral QHS   PRN Medications:   Assessment/Plan: Mr. Cameron Cameron [name] is a 45 y.o. male with PMH of HTN, HFrEF, CKD 5 who presents with anasarca and SOB which appears 2/2 acute on chronic renal failure.  1) Acute on CKD, stage 5: Vascular Surg to plan fistula in left arm and tunneled vasc-cath on 12/5. Pt accepted at [facility] on a Monday,Wednesday and Friday 2nd shift schedule. Nephrology following. - Continue HD per Nephrology - daily renal function panel   2) HFrEF (EF 35-40%): Stable. HF unclear whether ischemic or non-ischemic d/t chronic HTN and drug abuse. Previously prescribed carvedilol 25mg  BID, hydralazine 100mg  TID, Imdur 60mg  qD. - HD as above - Cont. carvedilol 25 BID  3)  HTN: BP mildly elevated to 160s SBP. Cont carvedilol 25mg  BID, amlodipine to 10mg , and hydralizine 50mg  TID.  4) Gout: Improved.  - pred 20mg  qD x 4 days - oxy 5 q6h PRN pain  Length of Stay: 6 day(s) Dispo: Anticipated discharge after uremia and anasarca managed successfully with HD. Will have vasc-cath tunneled and permanent access prepared on 12/5, will likely be stable for discharge following.  Shela Leff, MD Pager: [phone] (7AM-5PM) 04/05/2016, 7:07 AM

## 2016-04-06 DIAGNOSIS — D631 Anemia in chronic kidney disease: Secondary | ICD-10-CM | POA: Diagnosis present

## 2016-04-06 DIAGNOSIS — N189 Chronic kidney disease, unspecified: Secondary | ICD-10-CM

## 2016-04-06 LAB — BASIC METABOLIC PANEL
Anion gap: 11 (ref 5–15)
BUN: 54 mg/dL — ABNORMAL HIGH (ref 6–20)
CALCIUM: 8 mg/dL — AB (ref 8.9–10.3)
CHLORIDE: 101 mmol/L (ref 101–111)
CO2: 25 mmol/L (ref 22–32)
CREATININE: 8.49 mg/dL — AB (ref 0.61–1.24)
GFR calc non Af Amer: 7 mL/min — ABNORMAL LOW (ref >60)
GFR, EST AFRICAN AMERICAN: 8 mL/min — AB (ref >60)
Glucose, Bld: 82 mg/dL (ref 65–99)
Potassium: 3.7 mmol/L (ref 3.5–5.1)
SODIUM: 137 mmol/L (ref 135–145)

## 2016-04-06 LAB — PHOSPHORUS: PHOSPHORUS: 4 mg/dL (ref 2.5–4.6)

## 2016-04-06 LAB — CBC
HCT: 35.7 % — ABNORMAL LOW (ref 39.0–52.0)
Hemoglobin: 10.6 g/dL — ABNORMAL LOW (ref 13.0–17.0)
MCH: 21.7 pg — ABNORMAL LOW (ref 26.0–34.0)
MCHC: 29.7 g/dL — ABNORMAL LOW (ref 30.0–36.0)
MCV: 73.2 fL — ABNORMAL LOW (ref 78.0–100.0)
PLATELETS: 143 10*3/uL — AB (ref 150–400)
RBC: 4.88 MIL/uL (ref 4.22–5.81)
RDW: 18.8 % — ABNORMAL HIGH (ref 11.5–15.5)
WBC: 6 10*3/uL (ref 4.0–10.5)

## 2016-04-06 LAB — PREALBUMIN: PREALBUMIN: 33.5 mg/dL (ref 18–38)

## 2016-04-06 MED ORDER — LIDOCAINE HCL (PF) 1 % IJ SOLN: 5.0000 mL | INTRAMUSCULAR | Status: DC | PRN

## 2016-04-06 MED ORDER — LIDOCAINE-PRILOCAINE 2.5-2.5 % EX CREA: 1.0000 "application " | TOPICAL_CREAM | CUTANEOUS | Status: DC | PRN

## 2016-04-06 MED ORDER — HEPARIN SODIUM (PORCINE) 1000 UNIT/ML DIALYSIS: 1000.0000 [IU] | INTRAMUSCULAR | Status: DC | PRN

## 2016-04-06 MED ORDER — SODIUM CHLORIDE 0.9 % IV SOLN: 100.0000 mL | INTRAVENOUS | Status: DC | PRN

## 2016-04-06 MED ORDER — HEPARIN SODIUM (PORCINE) 1000 UNIT/ML DIALYSIS: 100.0000 [IU]/kg | INTRAMUSCULAR | Status: DC | PRN

## 2016-04-06 MED ORDER — PENTAFLUOROPROP-TETRAFLUOROETH EX AERO: 1.0000 "application " | INHALATION_SPRAY | CUTANEOUS | Status: DC | PRN

## 2016-04-06 MED ORDER — ALTEPLASE 2 MG IJ SOLR: 2.0000 mg | Freq: Once | INTRAMUSCULAR | Status: DC | PRN

## 2016-04-06 MED ORDER — DEXTROSE 5 % IV SOLN
1.5000 g | INTRAVENOUS | Status: AC
  Administered 2016-04-07: 1.5 g via INTRAVENOUS
  Filled 2016-04-06 (×2): qty 1.5

## 2016-04-06 NOTE — Progress Notes (Signed)
Patient refused to sign consent for left arm fistula and tunneled dialysis catheter placement stating that he discussed placing the fistula in the right arm and not the left arm with the MD.  On call MD was notified and MD reported that they will discuss using the right arm tomorrow.

## 2016-04-06 NOTE — Progress Notes (Signed)
This RN is taking over nursing care of the patient. Will continue to monitor.  

## 2016-04-06 NOTE — Progress Notes (Signed)
Subjective: Currently, the pt is feeling well. Answers several questions about Vascular procedures tomorrow and about dialysis. Pt still requests Social Work consult. Pt with lots of reflections on life and his current situation today.  Objective: Vital signs in last 24 hours: Vitals:   04/05/16 1722 04/05/16 2040 04/06/16 0643 04/06/16 1000  BP: (!) 135/91 125/79 (!) 145/97 138/88  Pulse: 94 82 89 78  Resp: 17 15 16 18   Temp: 98.9 F (37.2 C) 98.5 F (36.9 C) 98.2 F (36.8 C) 98 F (36.7 C)  TempSrc: Oral  Oral Oral  SpO2: 97% 100% 97% 98%  Weight:  202 lb (91.6 kg)    Height:       24-hour weight change: Filed Weights   04/04/16 1157 04/04/16 2100 04/05/16 2040  Weight: 197 lb 1.5 oz (89.4 kg) 199 lb 9.6 oz (90.5 kg) 202 lb (91.6 kg)   Dry Wt:  204 lbs after last admission in September 2017.  Intake/Output:  12/03 0701 - 12/04 0700 In: 42 [P.O.:960] Out: 50 [Urine:50]    Physical Exam: Physical Exam  Constitutional: No distress.  Cardiovascular: Normal rate, regular rhythm, normal heart sounds and intact distal pulses.   Pulmonary/Chest: Effort normal and breath sounds normal. No respiratory distress. He has no wheezes. He has no rales.  Abdominal: Soft. Bowel sounds are normal. There is no tenderness.  Musculoskeletal: He exhibits edema (Improving, 2+ BLE, 1+ sacral) and tenderness (right great toe at MTP). He exhibits no deformity.  Skin: Skin is warm and dry.   Labs: CBC:  Recent Labs Lab 03/31/16 0317 04/01/16 0725 04/02/16 1529 04/04/16 0756  WBC 6.0 6.0 6.6 7.5  HGB 8.6* 8.0* 8.4* 7.7*  HCT 27.1* 25.2* 27.2* 25.3*  MCV 68.6* 69.0* 70.6* 71.7*  PLT 143* 107* 161* 096*   Metabolic Panel:  Recent Labs Lab 04/01/16 0725 04/02/16 1528 04/04/16 0756 04/05/16 0607 04/06/16 0642  NA 138 138 135 137 137  K 4.1 4.2 3.8 3.9 3.7  CL 104 102 98* 100* 101  CO2 17* 21* 25 26 25   GLUCOSE 93 114* 138* 111* 82  BUN 112* 88* 62* 40* 54*  CREATININE  14.58* 12.37* 9.57* 6.71* 8.49*  CALCIUM 7.3* 7.4* 7.9* 8.0* 8.0*  PHOS  --  6.5*  --   --   --   ALT  --   --  16*  --   --   ALKPHOS  --   --  92  --   --   BILITOT  --   --  0.4  --   --   PROT  --   --  5.9*  --   --   ALBUMIN  --  3.3* 2.8*  --   --    Cardiac Labs: No results for input(s): CKTOTAL, CKMB, CKMBINDEX, TROPIPOC, TROPONINI, BNP in the last 168 hours.   Medications: Infusions:  Scheduled Medications: . amLODipine  10 mg Oral QHS  . calcitRIOL  1 mcg Oral QODAY  . calcium acetate  2,001 mg Oral TID WC  . carvedilol  25 mg Oral BID WC  . cinacalcet  60 mg Oral Q supper  . ferric gluconate (FERRLECIT/NULECIT) IV  125 mg Intravenous Q M,W,F-HD  . heparin  5,000 Units Subcutaneous Q8H  . hydrALAZINE  50 mg Oral Q8H  . multivitamin  1 tablet Oral QHS   PRN Medications:   Assessment/Plan: Mr. Belford Pascucci is a 45 y.o. male with PMH of HTN, HFrEF, CKD 5 who  presents with anasarca and SOB which appears 2/2 acute on chronic renal failure.  1) Acute on CKD, stage 5: Vascular Surg to plan fistula in left arm and tunneled vasc-cath tomorrow. Pt accepted at [facility] on a Monday,Wednesday and Friday 2nd shift schedule. - Continue HD per Nephrology - daily BMP/CBC  2) HFrEF (EF 35-40%): Stable. - HD as above - Cont. carvedilol 25mg  BID  3) HTN: BP improved. Cont carvedilol 25mg , amlodipine 10mg , hydralizine 50mg  TID.  4) Gout: Resolved.  Length of Stay: 7 day(s) Dispo: Anticipated discharge after uremia and anasarca managed successfully with HD. Will have vasc-cath tunneled and permanent access prepared on 12/5, will likely be stable for discharge following.  Holley Raring, MD Pager: [phone] (7AM-5PM) 04/06/2016, 11:21 AM

## 2016-04-06 NOTE — Progress Notes (Signed)
Patient ID: Cameron [name], male   DOB: 08/07/1970, 45 y.o.   MRN: [id number]  [facility] Progress Note    Subjective:   Feels well`   Objective:   BP 138/88 (BP Location: Right Arm)   Pulse 78   Temp 98 F (36.7 C) (Oral)   Resp 18   Ht 5\' 2"  (1.575 m)   Wt 91.6 kg (202 lb)   SpO2 98%   BMI 36.95 kg/m   Intake/Output: I/O last 3 completed shifts: In: 1380 [P.O.:1380] Out: 50 [Urine:50]   Intake/Output this shift:  Total I/O In: 240 [P.O.:240] Out: 0  Weight change: -2.173 kg (-4 lb 12.7 oz)  Physical Exam: Gen:WD WN AAM in NAD CVS:no rub Resp:cta YOK:HTXHFS Ext:1+ edema  Labs: BMET  Recent Labs Lab 03/31/16 0317 03/31/16 0915 04/01/16 0725 04/02/16 1528 04/04/16 0756 04/05/16 0607 04/06/16 0642  NA 136  --  138 138 135 137 137  K 4.2  --  4.1 4.2 3.8 3.9 3.7  CL 103  --  104 102 98* 100* 101  CO2 15*  --  17* 21* 25 26 25   GLUCOSE 129*  --  93 114* 138* 111* 82  BUN 146*  --  112* 88* 62* 40* 54*  CREATININE 17.10*  --  14.58* 12.37* 9.57* 6.71* 8.49*  ALBUMIN  --   --   --  3.3* 2.8*  --   --   CALCIUM 7.7* 7.4* 7.3* 7.4* 7.9* 8.0* 8.0*  PHOS  --   --   --  6.5*  --   --   --    CBC  Recent Labs Lab 03/31/16 0317 04/01/16 0725 04/02/16 1529 04/04/16 0756  WBC 6.0 6.0 6.6 7.5  HGB 8.6* 8.0* 8.4* 7.7*  HCT 27.1* 25.2* 27.2* 25.3*  MCV 68.6* 69.0* 70.6* 71.7*  PLT 143* 107* 101* 130*    @IMGRELPRIORS @ Medications:    . amLODipine  10 mg Oral QHS  . calcitRIOL  1 mcg Oral QODAY  . calcium acetate  2,001 mg Oral TID WC  . carvedilol  25 mg Oral BID WC  . cinacalcet  60 mg Oral Q supper  . ferric gluconate (FERRLECIT/NULECIT) IV  125 mg Intravenous Q M,W,F-HD  . heparin  5,000 Units Subcutaneous Q8H  . hydrALAZINE  50 mg Oral Q8H  . multivitamin  1 tablet Oral QHS     Assessment/ Plan:   1. Vascular access- for placement tomorrow of tdc and AVF/AVG. 2. ESRD set up for [facility] MWF 2nd shift. 3. Anemia:on IV Iron and  ESA 4. CKD-MBD: continue with binders and vit D 5. Nutrition: renal  6. Hypertension:stable 7. Disposition- hopefully discharged after access is placed.   Donetta Potts, MD [place] Pager [phone] 04/06/2016, 11:54 AM

## 2016-04-06 NOTE — Progress Notes (Signed)
Patient refused CHG bath. Explained to patient why we have to give the bath but patient adamantly refused. Vic Esco, Wonda Cheng, Therapist, sports

## 2016-04-07 ENCOUNTER — Inpatient Hospital Stay (HOSPITAL_COMMUNITY): Payer: Medicaid Other | Admitting: Anesthesiology

## 2016-04-07 ENCOUNTER — Inpatient Hospital Stay (HOSPITAL_COMMUNITY): Payer: Medicaid Other

## 2016-04-07 ENCOUNTER — Encounter (HOSPITAL_COMMUNITY)
Admission: EM | Disposition: A | Discharge status: Home / Self Care | Payer: Self-pay | Source: Home / Self Care | Attending: Internal Medicine

## 2016-04-07 DIAGNOSIS — N185 Chronic kidney disease, stage 5: Secondary | ICD-10-CM

## 2016-04-07 HISTORY — PX: AV FISTULA PLACEMENT: SHX1204

## 2016-04-07 HISTORY — PX: INSERTION OF DIALYSIS CATHETER: SHX1324

## 2016-04-07 LAB — CBC
HCT: 28.4 % — ABNORMAL LOW (ref 39.0–52.0)
Hemoglobin: 8.3 g/dL — ABNORMAL LOW (ref 13.0–17.0)
MCH: 21.4 pg — ABNORMAL LOW (ref 26.0–34.0)
MCHC: 29.2 g/dL — ABNORMAL LOW (ref 30.0–36.0)
MCV: 73.4 fL — ABNORMAL LOW (ref 78.0–100.0)
PLATELETS: 193 10*3/uL (ref 150–400)
RBC: 3.87 MIL/uL — ABNORMAL LOW (ref 4.22–5.81)
RDW: 19.1 % — AB (ref 11.5–15.5)
WBC: 6 10*3/uL (ref 4.0–10.5)

## 2016-04-07 LAB — RENAL FUNCTION PANEL
ALBUMIN: 2.9 g/dL — AB (ref 3.5–5.0)
Anion gap: 10 (ref 5–15)
BUN: 38 mg/dL — AB (ref 6–20)
CO2: 26 mmol/L (ref 22–32)
CREATININE: 6.57 mg/dL — AB (ref 0.61–1.24)
Calcium: 7.4 mg/dL — ABNORMAL LOW (ref 8.9–10.3)
Chloride: 100 mmol/L — ABNORMAL LOW (ref 101–111)
GFR calc Af Amer: 11 mL/min — ABNORMAL LOW (ref >60)
GFR, EST NON AFRICAN AMERICAN: 9 mL/min — AB (ref >60)
Glucose, Bld: 79 mg/dL (ref 65–99)
PHOSPHORUS: 3.5 mg/dL (ref 2.5–4.6)
Potassium: 3.8 mmol/L (ref 3.5–5.1)
SODIUM: 136 mmol/L (ref 135–145)

## 2016-04-07 LAB — BASIC METABOLIC PANEL
Anion gap: 10 (ref 5–15)
BUN: 33 mg/dL — AB (ref 6–20)
CHLORIDE: 98 mmol/L — AB (ref 101–111)
CO2: 28 mmol/L (ref 22–32)
CREATININE: 5.94 mg/dL — AB (ref 0.61–1.24)
Calcium: 7.8 mg/dL — ABNORMAL LOW (ref 8.9–10.3)
GFR calc Af Amer: 12 mL/min — ABNORMAL LOW (ref >60)
GFR calc non Af Amer: 10 mL/min — ABNORMAL LOW (ref >60)
Glucose, Bld: 77 mg/dL (ref 65–99)
POTASSIUM: 3.6 mmol/L (ref 3.5–5.1)
SODIUM: 136 mmol/L (ref 135–145)

## 2016-04-07 LAB — SURGICAL PCR SCREEN
MRSA, PCR: NEGATIVE
Staphylococcus aureus: POSITIVE — AB

## 2016-04-07 SURGERY — INSERTION OF DIALYSIS CATHETER
Anesthesia: Monitor Anesthesia Care | Site: Neck | Laterality: Right

## 2016-04-07 MED ORDER — MIDAZOLAM HCL 5 MG/5ML IJ SOLN
INTRAMUSCULAR | Status: DC | PRN
  Administered 2016-04-07: 2 mg via INTRAVENOUS

## 2016-04-07 MED ORDER — HYDROMORPHONE HCL 1 MG/ML IJ SOLN: 0.2500 mg | INTRAMUSCULAR | Status: DC | PRN

## 2016-04-07 MED ORDER — 0.9 % SODIUM CHLORIDE (POUR BTL) OPTIME
TOPICAL | Status: DC | PRN
  Administered 2016-04-07: 1000 mL

## 2016-04-07 MED ORDER — LIDOCAINE HCL (CARDIAC) 20 MG/ML IV SOLN
INTRAVENOUS | Status: DC | PRN
  Administered 2016-04-07: 100 mg via INTRAVENOUS

## 2016-04-07 MED ORDER — PHENYLEPHRINE HCL 10 MG/ML IJ SOLN
INTRAMUSCULAR | Status: DC | PRN
  Administered 2016-04-07: 120 ug via INTRAVENOUS
  Administered 2016-04-07: 80 ug via INTRAVENOUS
  Administered 2016-04-07: 120 ug via INTRAVENOUS
  Administered 2016-04-07: 80 ug via INTRAVENOUS
  Administered 2016-04-07: 4 ug via INTRAVENOUS

## 2016-04-07 MED ORDER — HEPARIN SODIUM (PORCINE) 1000 UNIT/ML IJ SOLN
INTRAMUSCULAR | Status: AC
  Filled 2016-04-07: qty 1

## 2016-04-07 MED ORDER — PROPOFOL 10 MG/ML IV BOLUS
INTRAVENOUS | Status: AC
  Filled 2016-04-07: qty 20

## 2016-04-07 MED ORDER — LIDOCAINE HCL (PF) 1 % IJ SOLN: 5.0000 mL | INTRAMUSCULAR | Status: DC | PRN

## 2016-04-07 MED ORDER — CALCIUM ACETATE (PHOS BINDER) 667 MG PO CAPS: 2001.0000 mg | ORAL_CAPSULE | Freq: Three times a day (TID) | ORAL | 0 refills | Status: AC

## 2016-04-07 MED ORDER — HYDRALAZINE HCL 50 MG PO TABS: 50.0000 mg | ORAL_TABLET | Freq: Three times a day (TID) | ORAL | 0 refills | Status: DC

## 2016-04-07 MED ORDER — HEPARIN SODIUM (PORCINE) 1000 UNIT/ML IJ SOLN
INTRAMUSCULAR | Status: DC | PRN
  Administered 2016-04-07: 1000 [IU]

## 2016-04-07 MED ORDER — SODIUM CHLORIDE 0.9 % IV SOLN: 100.0000 mL | INTRAVENOUS | Status: DC | PRN

## 2016-04-07 MED ORDER — AMLODIPINE BESYLATE 10 MG PO TABS: 10.0000 mg | ORAL_TABLET | Freq: Every day | ORAL | 0 refills | Status: DC

## 2016-04-07 MED ORDER — FENTANYL CITRATE (PF) 100 MCG/2ML IJ SOLN
INTRAMUSCULAR | Status: DC | PRN
  Administered 2016-04-07: 100 ug via INTRAVENOUS

## 2016-04-07 MED ORDER — LIDOCAINE-PRILOCAINE 2.5-2.5 % EX CREA: 1.0000 "application " | TOPICAL_CREAM | CUTANEOUS | Status: DC | PRN

## 2016-04-07 MED ORDER — SODIUM CHLORIDE 0.9 % IV SOLN: INTRAVENOUS | Status: DC | PRN

## 2016-04-07 MED ORDER — PHENYLEPHRINE HCL 10 MG/ML IJ SOLN
INTRAVENOUS | Status: DC | PRN
  Administered 2016-04-07: 20 ug/min via INTRAVENOUS

## 2016-04-07 MED ORDER — CARVEDILOL 25 MG PO TABS: 25.0000 mg | ORAL_TABLET | Freq: Two times a day (BID) | ORAL | 0 refills | Status: AC

## 2016-04-07 MED ORDER — CALCITRIOL 0.5 MCG PO CAPS: 1.0000 ug | ORAL_CAPSULE | ORAL | 0 refills | Status: AC

## 2016-04-07 MED ORDER — HEPARIN SODIUM (PORCINE) 1000 UNIT/ML DIALYSIS: 1000.0000 [IU] | INTRAMUSCULAR | Status: DC | PRN

## 2016-04-07 MED ORDER — PENTAFLUOROPROP-TETRAFLUOROETH EX AERO: 1.0000 "application " | INHALATION_SPRAY | CUTANEOUS | Status: DC | PRN

## 2016-04-07 MED ORDER — CINACALCET HCL 30 MG PO TABS: 60.0000 mg | ORAL_TABLET | Freq: Every day | ORAL | 0 refills | Status: AC

## 2016-04-07 MED ORDER — MIDAZOLAM HCL 2 MG/2ML IJ SOLN
INTRAMUSCULAR | Status: AC
  Filled 2016-04-07: qty 2

## 2016-04-07 MED ORDER — PROPOFOL 10 MG/ML IV BOLUS
INTRAVENOUS | Status: DC | PRN
  Administered 2016-04-07: 130 mg via INTRAVENOUS

## 2016-04-07 MED ORDER — HEPARIN SODIUM (PORCINE) 5000 UNIT/ML IJ SOLN
INTRAMUSCULAR | Status: DC | PRN
  Administered 2016-04-07: 14:00:00 500 mL

## 2016-04-07 MED ORDER — SODIUM CHLORIDE 0.9 % IV SOLN
INTRAVENOUS | Status: DC | PRN
  Administered 2016-04-07 (×2): via INTRAVENOUS

## 2016-04-07 MED ORDER — PROMETHAZINE HCL 25 MG/ML IJ SOLN: 6.2500 mg | INTRAMUSCULAR | Status: DC | PRN

## 2016-04-07 MED ORDER — CALCITRIOL 0.5 MCG PO CAPS: 1.0000 ug | ORAL_CAPSULE | ORAL | Status: DC

## 2016-04-07 MED ORDER — HEPARIN SODIUM (PORCINE) 1000 UNIT/ML DIALYSIS: 40.0000 [IU]/kg | Freq: Once | INTRAMUSCULAR | Status: DC

## 2016-04-07 MED ORDER — ROCURONIUM BROMIDE 100 MG/10ML IV SOLN
INTRAVENOUS | Status: DC | PRN
  Administered 2016-04-07: 60 mg via INTRAVENOUS

## 2016-04-07 MED ORDER — ALTEPLASE 2 MG IJ SOLR: 2.0000 mg | Freq: Once | INTRAMUSCULAR | Status: DC | PRN

## 2016-04-07 MED ORDER — EPHEDRINE SULFATE 50 MG/ML IJ SOLN
INTRAMUSCULAR | Status: DC | PRN
  Administered 2016-04-07: 5 mg via INTRAVENOUS

## 2016-04-07 MED ORDER — NEOSTIGMINE METHYLSULFATE 10 MG/10ML IV SOLN
INTRAVENOUS | Status: DC | PRN
  Administered 2016-04-07: 4 mg via INTRAVENOUS

## 2016-04-07 MED ORDER — MUPIROCIN 2 % EX OINT: 1.0000 "application " | TOPICAL_OINTMENT | Freq: Two times a day (BID) | CUTANEOUS | Status: DC

## 2016-04-07 MED ORDER — CHLORHEXIDINE GLUCONATE CLOTH 2 % EX PADS: 6.0000 | MEDICATED_PAD | Freq: Every day | CUTANEOUS | Status: DC

## 2016-04-07 MED ORDER — FENTANYL CITRATE (PF) 100 MCG/2ML IJ SOLN
INTRAMUSCULAR | Status: AC
  Filled 2016-04-07: qty 2

## 2016-04-07 MED ORDER — LIDOCAINE HCL (PF) 1 % IJ SOLN
INTRAMUSCULAR | Status: AC
  Filled 2016-04-07: qty 30

## 2016-04-07 MED ORDER — GLYCOPYRROLATE 0.2 MG/ML IJ SOLN
INTRAMUSCULAR | Status: DC | PRN
  Administered 2016-04-07: 0.6 mg via INTRAVENOUS

## 2016-04-07 SURGICAL SUPPLY — 65 items
ARMBAND PINK RESTRICT EXTREMIT (MISCELLANEOUS) ×8 IMPLANT
BAG DECANTER FOR FLEXI CONT (MISCELLANEOUS) ×4 IMPLANT
BIOPATCH RED 1 DISK 7.0 (GAUZE/BANDAGES/DRESSINGS) ×3 IMPLANT
BIOPATCH RED 1IN DISK 7.0MM (GAUZE/BANDAGES/DRESSINGS) ×1
CANISTER SUCTION 2500CC (MISCELLANEOUS) ×4 IMPLANT
CATH PALINDROME RT-P 15FX19CM (CATHETERS) IMPLANT
CATH PALINDROME RT-P 15FX23CM (CATHETERS) IMPLANT
CATH PALINDROME RT-P 15FX28CM (CATHETERS) IMPLANT
CATH PALINDROME RT-P 15FX55CM (CATHETERS) IMPLANT
CATH STRAIGHT 5FR 65CM (CATHETERS) IMPLANT
CLIP TI MEDIUM 6 (CLIP) ×4 IMPLANT
CLIP TI WIDE RED SMALL 6 (CLIP) ×4 IMPLANT
COVER PROBE W GEL 5X96 (DRAPES) ×4 IMPLANT
COVER SURGICAL LIGHT HANDLE (MISCELLANEOUS) ×4 IMPLANT
DECANTER SPIKE VIAL GLASS SM (MISCELLANEOUS) ×4 IMPLANT
DERMABOND ADVANCED (GAUZE/BANDAGES/DRESSINGS) ×4
DERMABOND ADVANCED .7 DNX12 (GAUZE/BANDAGES/DRESSINGS) ×4 IMPLANT
DRAPE C-ARM 42X72 X-RAY (DRAPES) ×4 IMPLANT
DRAPE CHEST BREAST 15X10 FENES (DRAPES) ×4 IMPLANT
DRSG COVADERM 4X6 (GAUZE/BANDAGES/DRESSINGS) ×4 IMPLANT
ELECT REM PT RETURN 9FT ADLT (ELECTROSURGICAL) ×4
ELECTRODE REM PT RTRN 9FT ADLT (ELECTROSURGICAL) ×2 IMPLANT
GAUZE SPONGE 2X2 8PLY STRL LF (GAUZE/BANDAGES/DRESSINGS) IMPLANT
GAUZE SPONGE 4X4 16PLY XRAY LF (GAUZE/BANDAGES/DRESSINGS) ×4 IMPLANT
GLOVE BIO SURGEON STRL SZ 6.5 (GLOVE) ×6 IMPLANT
GLOVE BIO SURGEON STRL SZ7 (GLOVE) ×8 IMPLANT
GLOVE BIO SURGEONS STRL SZ 6.5 (GLOVE) ×2
GLOVE BIOGEL PI IND STRL 6.5 (GLOVE) ×4 IMPLANT
GLOVE BIOGEL PI IND STRL 7.5 (GLOVE) ×4 IMPLANT
GLOVE BIOGEL PI INDICATOR 6.5 (GLOVE) ×4
GLOVE BIOGEL PI INDICATOR 7.5 (GLOVE) ×4
GLOVE SKINSENSE NS SZ7.0 (GLOVE) ×8
GLOVE SKINSENSE STRL SZ7.0 (GLOVE) ×8 IMPLANT
GOWN STRL REUS W/ TWL LRG LVL3 (GOWN DISPOSABLE) ×6 IMPLANT
GOWN STRL REUS W/ TWL XL LVL3 (GOWN DISPOSABLE) ×6 IMPLANT
GOWN STRL REUS W/TWL LRG LVL3 (GOWN DISPOSABLE) ×6
GOWN STRL REUS W/TWL XL LVL3 (GOWN DISPOSABLE) ×6
HEMOSTAT SPONGE AVITENE ULTRA (HEMOSTASIS) IMPLANT
KIT BASIN OR (CUSTOM PROCEDURE TRAY) ×4 IMPLANT
KIT ROOM TURNOVER OR (KITS) ×4 IMPLANT
NEEDLE 18GX1X1/2 (RX/OR ONLY) (NEEDLE) ×4 IMPLANT
NEEDLE HYPO 25GX1X1/2 BEV (NEEDLE) ×4 IMPLANT
NS IRRIG 1000ML POUR BTL (IV SOLUTION) ×4 IMPLANT
PACK CV ACCESS (CUSTOM PROCEDURE TRAY) ×4 IMPLANT
PACK SURGICAL SETUP 50X90 (CUSTOM PROCEDURE TRAY) ×4 IMPLANT
PAD ARMBOARD 7.5X6 YLW CONV (MISCELLANEOUS) ×8 IMPLANT
SET MICROPUNCTURE 5F STIFF (MISCELLANEOUS) IMPLANT
SOAP 2 % CHG 4 OZ (WOUND CARE) ×4 IMPLANT
SPONGE GAUZE 2X2 STER 10/PKG (GAUZE/BANDAGES/DRESSINGS)
SUT ETHILON 3 0 PS 1 (SUTURE) ×4 IMPLANT
SUT MNCRL AB 4-0 PS2 18 (SUTURE) ×4 IMPLANT
SUT PROLENE 6 0 BV (SUTURE) IMPLANT
SUT PROLENE 7 0 BV 1 (SUTURE) ×4 IMPLANT
SUT VIC AB 3-0 SH 27 (SUTURE) ×2
SUT VIC AB 3-0 SH 27X BRD (SUTURE) ×2 IMPLANT
SYR 20CC LL (SYRINGE) ×8 IMPLANT
SYR 3ML LL SCALE MARK (SYRINGE) ×4 IMPLANT
SYR 5ML LL (SYRINGE) ×4 IMPLANT
SYR CONTROL 10ML LL (SYRINGE) ×4 IMPLANT
SYRINGE 10CC LL (SYRINGE) ×4 IMPLANT
TOWEL OR 17X24 6PK STRL BLUE (TOWEL DISPOSABLE) ×4 IMPLANT
TOWEL OR 17X26 4PK STRL BLUE (TOWEL DISPOSABLE) ×4 IMPLANT
UNDERPAD 30X30 (UNDERPADS AND DIAPERS) ×4 IMPLANT
WATER STERILE IRR 1000ML POUR (IV SOLUTION) ×4 IMPLANT
WIRE AMPLATZ SS-J .035X180CM (WIRE) IMPLANT

## 2016-04-07 NOTE — Progress Notes (Signed)
Subjective: Interval History: has complaints discuss with surgeons which arm.  Objective: Vital signs in last 24 hours: Temp:  [97.7 F (36.5 C)-98.5 F (36.9 C)] 98.2 F (36.8 C) (12/05 0521) Pulse Rate:  [78-94] 83 (12/05 0521) Resp:  [16-20] 18 (12/05 0521) BP: (127-158)/(84-103) 154/94 (12/05 0521) SpO2:  [95 %-98 %] 98 % (12/05 0521) Weight:  [87.9 kg (193 lb 12.6 oz)-91.9 kg (202 lb 9.6 oz)] 87.9 kg (193 lb 12.8 oz) (12/04 2112) Weight change: 0.273 kg (9.6 oz)  Intake/Output from previous day: 12/04 0701 - 12/05 0700 In: 350 [P.O.:240; IV Piggyback:110] Out: 4000  Intake/Output this shift: No intake/output data recorded.  General appearance: alert, cooperative, no distress and moderately obese Neck: RIJ cath Resp: diminished breath sounds bilaterally Cardio: S1, S2 normal and systolic murmur: holosystolic 2/6, blowing at apex GI: obese, pos bs, liver down 5 cm Extremities: edema 2+  Lab Results:  Recent Labs  04/04/16 0756 04/06/16 1303  WBC 7.5 6.0  HGB 7.7* 10.6*  HCT 25.3* 35.7*  PLT 130* 143*   BMET:  Recent Labs  04/05/16 0607 04/06/16 0642  NA 137 137  K 3.9 3.7  CL 100* 101  CO2 26 25  GLUCOSE 111* 82  BUN 40* 54*  CREATININE 6.71* 8.49*  CALCIUM 8.0* 8.0*   No results for input(s): PTH in the last 72 hours. Iron Studies: No results for input(s): IRON, TIBC, TRANSFERRIN, FERRITIN in the last 72 hours.  Studies/Results: No results found.  I have reviewed the patient's current medications.  Assessment/Plan: 1 ESRD vol xs yet .  For Access 2 Anemia esa/Fe 3 HPTH vit D, cinn 4 Obesity 5 HTN coming down with lower vol 6 NONADHERENCE P Access, HD in am, bp meds, esa/Fe, vit D, cinn    LOS: 8 days   Wally Behan L 04/07/2016,7:15 AM

## 2016-04-07 NOTE — Clinical Social Work Note (Signed)
CSW met with patient and discussed his concerns regarding insurance (Medicaid) and being able to get his medications. CSW contacted financial counselor Cameron [name] and was informed that patient applied for Medicaid on 11/8 and patient opted for ongoing and not retro Medicaid. Per Cameron [name], he has since been informed that his application was denied and this was discussed, along with options for following up on Medicaid. Patient is currently employed and wants to continue to work if possible as he has a family to take care of. CSW also talked with patient regarding ongoing medical care (PCP) through health and wellness clinic. Patient will discharge this evening per nurse and patient is aware.  Cameron [name], MSW, LCSW Licensed Clinical Social Worker Clinical Social Work Department [facility] [phone] 

## 2016-04-07 NOTE — Discharge Summary (Signed)
Name: Cameron [name] MRN: [id number] DOB: 1971/02/25 45 y.o. PCP: No Pcp Per Patient  Date of Admission: 03/30/2016 10:07 AM Date of Discharge: 04/07/2016 Attending Physician: Lucious Groves, DO  Discharge Diagnosis: 1. ESRD w/ New Hemodialysis  Active Problems:   Acute on chronic combined systolic and diastolic heart failure (HCC)   Polysubstance abuse   Noncompliance   ESRD needing dialysis ([place])   Essential hypertension   Gout   Anemia of renal disease  Discharge Medications:   Medication List    STOP taking these medications   furosemide 80 MG tablet Commonly known as:  LASIX   isosorbide mononitrate 60 MG 24 hr tablet Commonly known as:  IMDUR   metolazone 5 MG tablet Commonly known as:  ZAROXOLYN     TAKE these medications   amLODipine 10 MG tablet Commonly known as:  NORVASC Take 1 tablet (10 mg total) by mouth at bedtime.   aspirin 81 MG EC tablet Take 1 tablet (81 mg total) by mouth daily.   calcitRIOL 0.5 MCG capsule Commonly known as:  ROCALTROL Take 2 capsules (1 mcg total) by mouth every other day.   calcium acetate 667 MG capsule Commonly known as:  PHOSLO Take 3 capsules (2,001 mg total) by mouth 3 (three) times daily with meals. What changed:  how much to take   carvedilol 25 MG tablet Commonly known as:  COREG Take 1 tablet (25 mg total) by mouth 2 (two) times daily with a meal.   cinacalcet 30 MG tablet Commonly known as:  SENSIPAR Take 2 tablets (60 mg total) by mouth daily with supper.   hydrALAZINE 50 MG tablet Commonly known as:  APRESOLINE Take 1 tablet (50 mg total) by mouth 3 (three) times daily. What changed:  medication strength  how much to take      Disposition and follow-up:   [name] was discharged from [facility] in Good condition.  At the hospital follow up visit please address:  1.  ESRD on HD: Check fluid status and breathing. Tolerance of HD, any issues w/ making it to dialysis  or adjusting. Gout: Treated w/o complaint, reassess for symptoms. Expect resolution now w/ dialysis. HTN: Check BP and titrate Hydral as needed.  2.  Labs / imaging needed at time of follow-up: Renal Function  Follow-up Appointments: Follow-up Information    Adele Barthel, MD In 6 weeks.   Specialties:  Vascular Surgery, Cardiology Why:  Office will call you to arrange your appt (sent) Contact information: [address] [place] [id number] [place] Hospital Course by problem list: Active Problems:   Acute on chronic combined systolic and diastolic heart failure ([place])   Polysubstance abuse   Noncompliance   ESRD needing dialysis ([place])   Essential hypertension   Gout   Anemia of renal disease   1. ESRD, new start Dialysis: Patient presented with worsening shortness of breath and oliguria in the setting of having discontinued his home diuretics. He had CK 80 stage III 5 and was recently discharged from the hospital in September where he was recommended that he start dialysis. At that time he refused and was discharged on aggressive diuresis regimen which she failed to comply with. He was uremic on presentation with BUN greater than 100 and significantly acidotic with bicarbonate of 10 and AG 20, pH 7.14. He was managed with emergent hemodialysis and has been dialyzed daily with ultrafiltration to remove his  fluid excess and to correct his electrolyte abnormalities. He has been feel significantly better and had no significant complaints at the time discharge. Vascular surgery saw him during his hospitalization and did vein mapping in order to plan for permanent access for dialysis. On the day of discharge he received a fistula placement in the left lower arm and had his vascular catheter tunneled for dialysis access an outpatient setting while awaiting maturation of his fistula. He tolerated this procedure well and was discharged home. He has follow-up with nephrology, and  primary care in the internal medicine Center clinic. He will also have follow-up with vascular surgery for second stage of his fistula. He was accepted for dialysis at [facility] dialysis on Monday Wednesday Friday second shift. Social work saw him prior to discharge in order to offer him resources on financial assistance for medications.  2. Hypertension: Patient presented with significant hypertension and this was managed with ultrafiltration during dialysis as well as reinitiation of his antihypertensive medications which she had stopped prior to arrival. He is now on amlodipine and milligrams, carvedilol 25 mg, and hydralazine 50 mg 3 times a day.  3. Gout: During his hospital physician patient point acutely tender podagra of the right great toe. He was treated with steroids and pain was controlled. The symptoms completely resolved by the time of discharge. He had uric acid levels which were within normal limits, likely helped by his hemodialysis.  Discharge Vitals:   BP (!) 146/107   Pulse 86   Temp 97.2 F (36.2 C)   Resp 14   Ht 5\' 2"  (1.575 m)   Wt 193 lb 12.8 oz (87.9 kg)   SpO2 93%   BMI 35.45 kg/m   Pertinent Labs, Studies, and Procedures: As above.  Procedures Performed:  Vein Mapping. Fistula Placement by Vascular Surgery  Consultations: Treatment Team:  Mauricia Area, MD Serafina Mitchell, MD  Discharge Instructions: Discharge Instructions    Diet - low sodium heart healthy    Complete by:  As directed    Discharge instructions    Complete by:  As directed    It will be important for you to follow up with your dialysis center on a regular basis. He was accepted at the [facility] dialysis on Monday Wednesday and Friday. You should come to your dialysis appointments at 11 AM.  Would also like you to follow up in the internal medicine Center clinic in the basement of the hospital in order to have your blood pressure and other medical problems monitored on  a regular basis. We'll schedule follow-up appointment from your hospitalization and you should be able to receive some financial assistance through our clinic if needed.   Increase activity slowly    Complete by:  As directed       Signed: Holley Raring, MD 04/07/2016, 4:17 PM   Pager: [phone]

## 2016-04-07 NOTE — Op Note (Signed)
OPERATIVE NOTE  PROCEDURE: 1.  Right internal jugular vein tunneled dialysis catheter exchange 2.  Left brachiocephalic arteriovenous fistula   PRE-OPERATIVE DIAGNOSIS: end-stage renal failure  POST-OPERATIVE DIAGNOSIS: same as above  SURGEON: Adele Barthel, MD  ANESTHESIA: general  ESTIMATED BLOOD LOSS: 30 cc  FINDING(S): 1.  Tips of the catheter in the right atrium on fluoroscopy 2.  No obvious pneumothorax on fluoroscopy 3. Palpable thrill at end of case 4.  Dopplerable radial signal at end of case  SPECIMEN(S):  none  INDICATIONS:   Cameron [name] is a 45 y.o. ([birth year]) male who presents with acute on chronic renal failure.  The patient presents for tunneled dialysis catheter exchange and left arm fistula placement.  The patient is aware the risks of tunneled dialysis catheter placement include but are not limited to: bleeding, infection, central venous injury, pneumothorax, possible venous stenosis, possible malpositioning in the venous system, and possible infections related to long-term catheter presence.  Risk, benefits, and alternatives to access surgery were discussed.  The patient is aware the risks include but are not limited to: bleeding, infection, steal syndrome, nerve damage, ischemic monomelic neuropathy, failure to mature, need for additional procedures, death and stroke.  The patient agrees to proceed forward with the procedure.   DESCRIPTION: After written full informed consent was obtained from the patient, the patient was taken back to the operating room.  Prior to induction, the patient was given IV antibiotics.  After obtaining adequate sedation, the patient was prepped and draped in the standard fashion for a chest or neck tunneled dialysis catheter placement.  I transected the prior temporary dialysis catheter, revealing the lumens of the catheter.  The catheter was secured with a clamp and one lumen was cannulated with the J-wire, which was advanced into  the right ventricle under fluroscopic guidance.  The wire was then secured in place with a clamp to the drapes.  I then extended the neck incision with a 11-blade.  I exchanged the old residual dialysis catheter for the dilator-sheath, which was placed under fluroscopic guidance into the superior vena cava.  The dilator and wire were removed.  I loaded a 23 cm Palindrome catheter into the right atrium under fluoroscopic guidance.  The sheath was broken and peeled away while holding the catheter cuff at the level of the skin.    I made a stab incision at the chest exit site.   I dissected from the exit site to the neck site with a metal tunneler.   The back end of the catheter was transected and I loaded the catheter onto the metal tunneler.  I delivered the catheter through the subcutaneous tunnel to the exit site.  The back end of this catheter was transected again, revealing the two lumens of this catheter.  The ports were docked onto these two lumens.  The catheter hub was then clicked into place.  Each port was tested by aspirating and flushing.  No resistance was noted.  Each port was then thoroughly flushed with heparinized saline.  The catheter was secured in placed with two interrupted stitches of 3-0 Nylon tied to the catheter.  The neck incision was closed with a U-stitch of 4-0 Monocryl.  The neck and chest incision were cleaned and sterile bandages applied.  Each port was then loaded with concentrated heparin (1000 Units/mL) at the manufacturer recommended volumes to each port.  Sterile caps were applied to each port.  On completion fluoroscopy, the tips of the catheter were  in the right atrium, and there was no evidence of pneumothorax.  At this point, the drapes were removed and the patient was repositioned for a left arm access procedure.  He was reprepped and redraped.  I turned my attention first to identifying the patient's cephalic vein and brachial artery.  Using SonoSite guidance, the  location of these vessels were marked out on the skin.   I made a transverse incision at the level of the antecubitum and dissected through the subcutaneous tissue and fascia to gain exposure of the brachial artery.  This was noted to be 3 mm in diameter externally.  This was dissected out proximally and distally and controlled with vessel loops .  I then dissected out the cephalic vein.  This was noted to be 3.5 mm in diameter externally.  The distal segment of the vein was ligated with a  2-0 silk, and the vein was transected.  The proximal segment was interrogated with serial dilators.  The vein accepted up to a 4 mm dilator without any difficulty.  I then instilled the heparinized saline into the vein and clamped it.  At this point, I reset my exposure of the brachial artery and placed the artery under tension proximally and distally.  I made an arteriotomy with a #11 blade, and then I extended the arteriotomy with a Potts scissor.  I injected heparinized saline proximal and distal to this arteriotomy.  The vein was then sewn to the artery in an end-to-side configuration with a running stitch of 7-0 Prolene.  Prior to completing this anastomosis, I allowed the vein and artery to backbleed.  There was no evidence of clot from any vessels.  I completed the anastomosis in the usual fashion and then released all vessel loops and clamps.    There was a palpable thrill in the venous outflow, and there was a dopplerable radial signal that had minimal augmentation with outflow compression.  At this point, I irrigated out the surgical wound.  There was no further active bleeding.  The subcutaneous tissue was reapproximated with a running stitch of 3-0 Vicryl.  The skin was then reapproximated with a running subcuticular stitch of 4-0 Vicryl.  The skin was then cleaned, dried, and reinforced with LiquidBand.  The patient tolerated this procedure well.    COMPLICATIONS: none  CONDITION: stable   Adele Barthel, MD,  [facility] Office: [phone] Pager: [phone]  04/07/2016, 1:48 PM

## 2016-04-07 NOTE — Transfer of Care (Signed)
Immediate Anesthesia Transfer of Care Note  Patient: Cameron [name]  Procedure(s) Performed: Procedure(s): INSERTION OF DIALYSIS CATHETER (Right) Left  arm Brachiocephalic ARTERIOVENOUS (AV) FISTULA CREATION (Left)  Patient Location: PACU  Anesthesia Type:General  Level of Consciousness: awake, alert , oriented and patient cooperative  Airway & Oxygen Therapy: Patient Spontanous Breathing and Patient connected to nasal cannula oxygen  Post-op Assessment: Report given to RN and Post -op Vital signs reviewed and stable  Post vital signs: Reviewed and stable  Last Vitals:  Vitals:   04/07/16 1000 04/07/16 1503  BP: (!) 142/88   Pulse: 81   Resp: 18   Temp: 36.7 C 36.2 C    Last Pain:  Vitals:   04/07/16 1503  TempSrc:   PainSc: 0-No pain      Patients Stated Pain Goal: 0 (25/18/98 4210)  Complications: No apparent anesthesia complications

## 2016-04-07 NOTE — H&P (View-Only) (Signed)
Hospital Consult    Reason for Consult:  In need of dialysis access Referring Physician:  Deterding MRN #:  [id number]  History of Present Illness: This is a 45 y.o. male who presented to the ED with complaints of anasarca, SOB and reduced UOP for about a week.  He was in the ED in September with similar sx and a baseline with CKD 4 with combined systolic and diastolic HF.  A cardiac catheterization was recommended to determine ischemic vs non-ischemic but the pt declined.  He was treated with Lasix and metolazone diuresis and was discharged on medications.  He discontinued these medications after his last hospital visit.  He has had progressive swelling and increasing shortness of breath.  He has had difficulty with malaise, confusion, difficulty sleeping, chest tightness and decreased urine frequency.    His chart is noted that the pt has an EF of 35%.  In the ER this visit, he was found to have K+ of 5.3, BUN OF 198 & creatinine of 21. EKG revealed no ischemic changes.  He was admitted to the hospital and a temporary dialysis catheter was placed and he underwent dialysis.    He is on a baby aspirin daily.  He is on a beta blocker for blood pressure control.  He is on diuretics but it is noted he has not been taking these medications.    Of note, the pt was somewhat sleeping and did not want to give information and therefore the history was mostly taken from the chart.  RN came with me to see the pt and this is his baseline if he has been sleeping as this is how he was prior to going to HD.  Otherwise, he is up and about.  He did wake enough to answer a few questions.  RN states that he had been up eating lunch prior to my arrival.   Past Medical History:  Diagnosis Date  . CHF (congestive heart failure) ([place])   . Hypertension   . Renal disorder     Past Surgical History:  Procedure Laterality Date  . NO PAST SURGERIES      No Known Allergies  Prior to Admission medications     Medication Sig Start Date End Date Taking? Authorizing Provider  aspirin EC 81 MG EC tablet Take 1 tablet (81 mg total) by mouth daily. 01/23/16  Yes Barton Dubois, MD  furosemide (LASIX) 80 MG tablet Take 2 tablets (160 mg total) by mouth 3 (three) times daily. 01/22/16  Yes Barton Dubois, MD  hydrALAZINE (APRESOLINE) 100 MG tablet Take 1 tablet (100 mg total) by mouth 3 (three) times daily. 01/22/16  Yes Barton Dubois, MD  calcium acetate (PHOSLO) 667 MG capsule Take 1 capsule (667 mg total) by mouth 3 (three) times daily with meals. 01/22/16   Barton Dubois, MD  carvedilol (COREG) 25 MG tablet Take 1 tablet (25 mg total) by mouth 2 (two) times daily with a meal. 01/22/16   Barton Dubois, MD  isosorbide mononitrate (IMDUR) 60 MG 24 hr tablet Take 1 tablet (60 mg total) by mouth daily. 01/23/16   Barton Dubois, MD  metolazone (ZAROXOLYN) 5 MG tablet Take 1 tablet (5 mg total) by mouth daily. 01/23/16   Barton Dubois, MD    Social History   Social History  . Marital status: Married    Spouse name: N/A  . Number of children: N/A  . Years of education: N/A   Occupational History  . Not on file.  Social History Main Topics  . Smoking status: Former Smoker    Packs/day: 0.10    Years: 5.00    Types: Cigarettes  . Smokeless tobacco: Never Used     Comment: quit  smoking years ago "  . Alcohol use No  . Drug use: No  . Sexual activity: Not on file   Other Topics Concern  . Not on file   Social History Narrative  . No narrative on file     Family History  Problem Relation Age of Onset  . Kidney failure Mother     was on dialysis    ROS: [x]  Positive   [ ]  Negative   [ ]  All sytems reviewed and are negative  Cardiovascular: [x]  chest pain/pressure []  palpitations [x]  SOB lying flat [x]  DOE []  pain in legs while walking []  pain in legs at rest []  pain in legs at night []  non-healing ulcers []  hx of DVT [x]  swelling in legs  Pulmonary: []  productive cough []   asthma/wheezing []  home O2  Neurologic: []  weakness in []  arms []  legs []  numbness in []  arms []  legs []  hx of CVA []  mini stroke [] difficulty speaking or slurred speech []  temporary loss of vision in one eye []  dizziness  Hematologic: []  hx of cancer []  bleeding problems []  problems with blood clotting easily [x]  anemia  Endocrine:   []  diabetes []  thyroid disease  GI []  vomiting blood []  blood in stool  GU: [x]  CKD/renal failure []  HD--[]  M/W/F or []  T/T/S []  burning with urination []  blood in urine [x]  decreased urine   Psychiatric: []  anxiety []  depression  Musculoskeletal: []  arthritis []  joint pain  Integumentary: []  rashes []  ulcers  Constitutional: []  fever []  chills [x]  malaise   Physical Examination  Vitals:   03/31/16 1130 03/31/16 1210  BP: (!) 166/97   Pulse: 95 96  Resp: 18 20  Temp: 98 F (36.7 C)    Body mass index is 39.44 kg/m.  General:  WDWN in NAD Gait: Not observed HENT: WNL, normocephalic Pulmonary: normal non-labored breathing, without Rales, rhonchi,  wheezing Cardiac: regular, without  Murmurs, rubs or gallops; without carotid bruit on the left.  Temp IJ catheter on the right. Abdomen:  soft, NT/ND, no masses Skin: without rashes Vascular Exam/Pulses:  Right Left  Radial 2+ (normal) 1+-2+ weaker than the right  Ulnar Unable to palpate  Unable to palpate   DP Triphasic doppler signal Biphasic doppler signal  PT Triphasic doppler signal Triphasic doppler signal   Extremities: without ischemic changes, without Gangrene , without cellulitis; without open wounds; 3-4+edema BLE Musculoskeletal: no muscle wasting or atrophy  Neurologic: sleepy;  No focal weakness or paresthesias are detected; speech is fluent/normal Psychiatric:  The pt has abnormal affect as he is very sleepy. He does awake and answer questions.  RN says this is baseline after he has been sleeping as he was this way prior to dialysis.     CBC      Component Value Date/Time   WBC 6.0 03/31/2016 0317   RBC 3.95 (L) 03/31/2016 0317   HGB 8.6 (L) 03/31/2016 0317   HCT 27.1 (L) 03/31/2016 0317   PLT 143 (L) 03/31/2016 0317   MCV 68.6 (L) 03/31/2016 0317   MCH 21.8 (L) 03/31/2016 0317   MCHC 31.7 03/31/2016 0317   RDW 19.4 (H) 03/31/2016 0317   LYMPHSABS 0.6 (L) 03/30/2016 1009   MONOABS 0.2 03/30/2016 1009   EOSABS 0.1 03/30/2016 1009  BASOSABS 0.0 03/30/2016 1009    BMET    Component Value Date/Time   NA 136 03/31/2016 0317   K 4.2 03/31/2016 0317   CL 103 03/31/2016 0317   CO2 15 (L) 03/31/2016 0317   GLUCOSE 129 (H) 03/31/2016 0317   BUN 146 (H) 03/31/2016 0317   CREATININE 17.10 (H) 03/31/2016 0317   CALCIUM 7.7 (L) 03/31/2016 0317   GFRNONAA 3 (L) 03/31/2016 0317   GFRAA 3 (L) 03/31/2016 0317    COAGS: No results found for: INR, PROTIME   Non-Invasive Vascular Imaging:   Vein mapping ordered.  Statin:  No. Beta Blocker:  Yes.   Aspirin:  Yes.   ACEI:  No. ARB:  No. CCB use:  No Other antiplatelets/anticoagulants:  Yes.  SQ heparin   ASSESSMENT/PLAN: This is a 45 y.o. male admitted yesterday with uremia and now with ESRD in need of dialysis access [facility] & permanent access).   -The pt has abnormal affect as he is very sleepy. He does awake and answer questions.  RN says this is baseline after he has been sleeping as he was this way prior to dialysis.   -he states he is left handed.  The pulse in the right hand is better than the left.  There is an IV in the right arm and will most likely need to be moved pending vein mapping results.   -I discussed dialysis access.  He did stay awake for this and did not have any questions at this time.   -Dr. Trula Slade will be by to see the pt later today.    Leontine Locket, PA-C [facility] [phone]   I agree with the above.  I have seen and evaluated the patient.  He is RIGHT handed.  Vein mapping shows an adequate left cephalic vein.  He'll  be scheduled for a left arm fistula and tunneled permanent dialysis catheter  Wells Lyzette Reinhardt

## 2016-04-07 NOTE — Interval H&P Note (Signed)
History and Physical Interval Note:  04/07/2016 12:26 PM  Cameron [name]  has presented today for surgery, with the diagnosis of End Stage Renal Disease N18.6  The various methods of treatment have been discussed with the patient and family. After consideration of risks, benefits and other options for treatment, the patient has consented to  Procedure(s): INSERTION OF DIALYSIS CATHETER (N/A) ARTERIOVENOUS (AV) FISTULA CREATION (Left) as a surgical intervention .  The patient's history has been reviewed, patient examined, no change in status, stable for surgery.  I have reviewed the patient's chart and labs.  Questions were answered to the patient's satisfaction.     Adele Barthel

## 2016-04-07 NOTE — Discharge Instructions (Signed)
° ° °  04/07/2016 Cameron [name] [id number] Feb 09, 1971  Surgeon(s): Conrad [place], MD  Procedure(s): INSERTION OF DIALYSIS CATHETER Left  arm Brachiocephalic ARTERIOVENOUS (AV) FISTULA CREATION  x Do not stick fistula for 12 weeks

## 2016-04-07 NOTE — Progress Notes (Signed)
Cameron [name] to be D/C'd Home per MD order.  Discussed prescriptions and follow up appointments with the patient. Prescriptions given to patient, medication list explained in detail. Pt verbalized understanding.    Medication List    STOP taking these medications   furosemide 80 MG tablet Commonly known as:  LASIX   isosorbide mononitrate 60 MG 24 hr tablet Commonly known as:  IMDUR   metolazone 5 MG tablet Commonly known as:  ZAROXOLYN     TAKE these medications   amLODipine 10 MG tablet Commonly known as:  NORVASC Take 1 tablet (10 mg total) by mouth at bedtime.   aspirin 81 MG EC tablet Take 1 tablet (81 mg total) by mouth daily.   calcitRIOL 0.5 MCG capsule Commonly known as:  ROCALTROL Take 2 capsules (1 mcg total) by mouth every other day.   calcium acetate 667 MG capsule Commonly known as:  PHOSLO Take 3 capsules (2,001 mg total) by mouth 3 (three) times daily with meals. What changed:  how much to take   carvedilol 25 MG tablet Commonly known as:  COREG Take 1 tablet (25 mg total) by mouth 2 (two) times daily with a meal.   cinacalcet 30 MG tablet Commonly known as:  SENSIPAR Take 2 tablets (60 mg total) by mouth daily with supper.   hydrALAZINE 50 MG tablet Commonly known as:  APRESOLINE Take 1 tablet (50 mg total) by mouth 3 (three) times daily. What changed:  medication strength  how much to take       Vitals:   04/07/16 1548 04/07/16 1717  BP: (!) 137/107 (!) 146/95  Pulse: 86 87  Resp: 18 18  Temp:  98 F (36.7 C)    Skin clean, dry and intact without evidence of skin break down, no evidence of skin tears noted. IV catheter discontinued intact. Site without signs and symptoms of complications. Dressing and pressure applied. Pt denies pain at this time. No complaints noted.  An After Visit Summary was printed and given to the patient. Patient ambulated off floor, and D/C home via private auto.  Retta Mac BSN, RN

## 2016-04-07 NOTE — Anesthesia Procedure Notes (Signed)
Procedure Name: Intubation Date/Time: 04/07/2016 1:21 PM Performed by: Shirlyn Goltz Pre-anesthesia Checklist: Patient identified, Emergency Drugs available, Suction available and Patient being monitored Patient Re-evaluated:Patient Re-evaluated prior to inductionOxygen Delivery Method: Circle system utilized Preoxygenation: Pre-oxygenation with 100% oxygen Intubation Type: IV induction Ventilation: Mask ventilation without difficulty and Oral airway inserted - appropriate to patient size Laryngoscope Size: Mac and 3 Grade View: Grade II Tube type: Oral Tube size: 7.5 mm Number of attempts: 1 Airway Equipment and Method: Stylet Placement Confirmation: ETT inserted through vocal cords under direct vision,  positive ETCO2 and breath sounds checked- equal and bilateral Secured at: 22 cm Tube secured with: Tape Dental Injury: Teeth and Oropharynx as per pre-operative assessment

## 2016-04-07 NOTE — Anesthesia Preprocedure Evaluation (Addendum)
Anesthesia Evaluation  Patient identified by MRN, date of birth, ID band Patient awake    Reviewed: Allergy & Precautions, NPO status , Patient's Chart, lab work & pertinent test results, reviewed documented beta blocker date and time   History of Anesthesia Complications Negative for: history of anesthetic complications  Airway Mallampati: II  TM Distance: >3 FB Neck ROM: Full    Dental no notable dental hx. (+) Dental Advisory Given   Pulmonary former smoker,    Pulmonary exam normal        Cardiovascular hypertension, Pt. on medications and Pt. on home beta blockers Normal cardiovascular exam     Neuro/Psych negative neurological ROS  negative psych ROS   GI/Hepatic negative GI ROS, Neg liver ROS,   Endo/Other  negative endocrine ROS  Renal/GU CRFRenal disease     Musculoskeletal   Abdominal   Peds  Hematology   Anesthesia Other Findings   Reproductive/Obstetrics                            Anesthesia Physical Anesthesia Plan  ASA: III  Anesthesia Plan: General   Post-op Pain Management:    Induction: Intravenous  Airway Management Planned: Oral ETT  Additional Equipment:   Intra-op Plan:   Post-operative Plan: Extubation in OR  Informed Consent: I have reviewed the patients History and Physical, chart, labs and discussed the procedure including the risks, benefits and alternatives for the proposed anesthesia with the patient or authorized representative who has indicated his/her understanding and acceptance.   Dental advisory given  Plan Discussed with: CRNA and Anesthesiologist  Anesthesia Plan Comments:        Anesthesia Quick Evaluation

## 2016-04-07 NOTE — Progress Notes (Signed)
Subjective: Currently, the pt is feeling okay. He is frustrated about his NPO status pre-procedure and still requests to see a Education officer, museum. OR scheduled for 1230 today.  Objective: Vital signs in last 24 hours: Vitals:   04/06/16 1630 04/06/16 1650 04/06/16 2112 04/07/16 0521  BP: (!) 155/100 (!) 149/89 127/84 (!) 154/94  Pulse: 91 88 86 83  Resp: 17 18 18 18   Temp:  98.4 F (36.9 C) 97.7 F (36.5 C) 98.2 F (36.8 C)  TempSrc:  Oral Oral Oral  SpO2:  95% 98% 98%  Weight:  193 lb 12.6 oz (87.9 kg) 193 lb 12.8 oz (87.9 kg)   Height:       24-hour weight change: Filed Weights   04/06/16 1240 04/06/16 1650 04/06/16 2112  Weight: 202 lb 9.6 oz (91.9 kg) 193 lb 12.6 oz (87.9 kg) 193 lb 12.8 oz (87.9 kg)   Dry Wt:  204 lbs after last admission in September 2017.  Intake/Output:  12/04 0701 - 12/05 0700 In: 350 [P.O.:240; IV Piggyback:110] Out: 4000     Physical Exam: Physical Exam  Constitutional: No distress.  Cardiovascular: Normal rate, regular rhythm, normal heart sounds and intact distal pulses.   Pulmonary/Chest: Effort normal and breath sounds normal. No respiratory distress. He has no wheezes. He has no rales.  Abdominal: Soft. Bowel sounds are normal. There is no tenderness.  Musculoskeletal: He exhibits edema (Improving, 2+ BLE, 1+ sacral). He exhibits no deformity.  Skin: Skin is warm and dry.   Labs: CBC:  Recent Labs Lab 04/01/16 0725 04/02/16 1529 04/04/16 0756 04/06/16 1303  WBC 6.0 6.6 7.5 6.0  HGB 8.0* 8.4* 7.7* 10.6*  HCT 25.2* 27.2* 25.3* 35.7*  MCV 69.0* 70.6* 71.7* 73.2*  PLT 107* 101* 166* 063*   Metabolic Panel:  Recent Labs Lab 04/01/16 0725 04/02/16 1528 04/04/16 0756 04/05/16 0607 04/06/16 0642 04/06/16 1303  NA 138 138 135 137 137  --   K 4.1 4.2 3.8 3.9 3.7  --   CL 104 102 98* 100* 101  --   CO2 17* 21* 25 26 25   --   GLUCOSE 93 114* 138* 111* 82  --   BUN 112* 88* 62* 40* 54*  --   CREATININE 14.58* 12.37* 9.57* 6.71*  8.49*  --   CALCIUM 7.3* 7.4* 7.9* 8.0* 8.0*  --   PHOS  --  6.5*  --   --   --  4.0  ALT  --   --  16*  --   --   --   ALKPHOS  --   --  92  --   --   --   BILITOT  --   --  0.4  --   --   --   PROT  --   --  5.9*  --   --   --   ALBUMIN  --  3.3* 2.8*  --   --   --     Medications: Scheduled Medications: . amLODipine  10 mg Oral QHS  . calcitRIOL  1 mcg Oral QODAY  . calcium acetate  2,001 mg Oral TID WC  . carvedilol  25 mg Oral BID WC  . cefUROXime (ZINACEF)  IV  1.5 g Intravenous On Call to OR  . Chlorhexidine Gluconate Cloth  6 each Topical Daily  . cinacalcet  60 mg Oral Q supper  . ferric gluconate (FERRLECIT/NULECIT) IV  125 mg Intravenous Q M,W,F-HD  . heparin  5,000 Units  Subcutaneous Q8H  . hydrALAZINE  50 mg Oral Q8H  . multivitamin  1 tablet Oral QHS  . mupirocin ointment  1 application Nasal BID   Assessment/Plan: Cameron [name] is a 45 y.o. male with PMH of HTN, HFrEF, CKD 5 who presents with anasarca and SOB which appears 2/2 acute on chronic renal failure.  1) Acute on CKD, stage 5: Vascular Surg to plan fistula and tunneled vasc-cath today. Pt accepted at [facility] on a Monday,Wednesday and Friday 2nd shift schedule. - Continue HD per Nephrology - daily BMP/CBC  2) HFrEF (EF 35-40%): Stable. - HD as above - Cont. carvedilol 25mg  BID  3) HTN: BP improved. Cont carvedilol 25mg , amlodipine 10mg , hydralizine 50mg  TID.  Length of Stay: 8 day(s) Dispo: Anticipated discharge today following AVG and TDC.  Holley Raring, MD Pager: [phone] (7AM-5PM) 04/07/2016, 7:24 AM

## 2016-04-07 NOTE — Anesthesia Postprocedure Evaluation (Signed)
Anesthesia Post Note  Patient: Cameron [name]  Procedure(s) Performed: Procedure(s) (LRB): INSERTION OF DIALYSIS CATHETER (Right) Left  arm Brachiocephalic ARTERIOVENOUS (AV) FISTULA CREATION (Left)  Patient location during evaluation: PACU Anesthesia Type: General Level of consciousness: sedated Pain management: pain level controlled Vital Signs Assessment: post-procedure vital signs reviewed and stable Respiratory status: spontaneous breathing and respiratory function stable Cardiovascular status: stable Anesthetic complications: no    Last Vitals:  Vitals:   04/07/16 1502 04/07/16 1503  BP: (!) 146/107   Pulse: 86   Resp: 14   Temp:  36.2 C    Last Pain:  Vitals:   04/07/16 1503  TempSrc:   PainSc: 0-No pain                 [name]

## 2016-04-08 ENCOUNTER — Telehealth: Payer: Self-pay | Admitting: Vascular Surgery

## 2016-04-08 ENCOUNTER — Encounter (HOSPITAL_COMMUNITY): Payer: Self-pay | Admitting: Vascular Surgery

## 2016-04-08 LAB — CBC
HEMATOCRIT: 27.4 % — AB (ref 39.0–52.0)
Hemoglobin: 8.1 g/dL — ABNORMAL LOW (ref 13.0–17.0)
MCH: 21.6 pg — AB (ref 26.0–34.0)
MCHC: 29.6 g/dL — ABNORMAL LOW (ref 30.0–36.0)
MCV: 73.1 fL — AB (ref 78.0–100.0)
PLATELETS: 155 10*3/uL (ref 150–400)
RBC: 3.75 MIL/uL — ABNORMAL LOW (ref 4.22–5.81)
RDW: 18.6 % — AB (ref 11.5–15.5)
WBC: 7.4 10*3/uL (ref 4.0–10.5)

## 2016-04-08 NOTE — Telephone Encounter (Signed)
Sched appt 05/22/16 at 1:00. Spoke to pt. Pt has PM dialysis on MWF. Changed time to 8:30.

## 2016-04-08 NOTE — Telephone Encounter (Signed)
-----   Message from Denman George, RN sent at 04/07/2016  4:10 PM EST ----- Regarding: needs 6 wk. f/u with Dr. Bridgett Larsson; s/p left arm AVF; no duplex needed   ----- Message ----- From: Gabriel Earing, PA-C Sent: 04/07/2016   2:54 PM To: Vvs Charge Pool  S/p left BC AVF 04/07/16.  F/u with Dr. Bridgett Larsson in 6 weeks.  Does not need duplex.  Thanks, Aldona Bar

## 2016-04-15 ENCOUNTER — Telehealth: Payer: Self-pay | Admitting: General Practice

## 2016-04-15 NOTE — Telephone Encounter (Signed)
APT. REMINDER CALL, LMTCB °

## 2016-04-16 ENCOUNTER — Ambulatory Visit (INDEPENDENT_AMBULATORY_CARE_PROVIDER_SITE_OTHER): Payer: Medicaid Other | Admitting: Internal Medicine

## 2016-04-16 ENCOUNTER — Encounter: Payer: Self-pay | Admitting: Internal Medicine

## 2016-04-16 VITALS — BP 151/88 | HR 93 | Temp 97.8°F | Ht 62.0 in | Wt 185.6 lb

## 2016-04-16 DIAGNOSIS — I120 Hypertensive chronic kidney disease with stage 5 chronic kidney disease or end stage renal disease: Secondary | ICD-10-CM

## 2016-04-16 DIAGNOSIS — Z992 Dependence on renal dialysis: Secondary | ICD-10-CM | POA: Diagnosis not present

## 2016-04-16 DIAGNOSIS — N186 End stage renal disease: Secondary | ICD-10-CM | POA: Diagnosis not present

## 2016-04-16 DIAGNOSIS — Z87891 Personal history of nicotine dependence: Secondary | ICD-10-CM

## 2016-04-16 DIAGNOSIS — Z79899 Other long term (current) drug therapy: Secondary | ICD-10-CM

## 2016-04-16 DIAGNOSIS — I10 Essential (primary) hypertension: Secondary | ICD-10-CM

## 2016-04-16 NOTE — Patient Instructions (Addendum)
It was a pleasure to meet you Cameron [name].  I am glad you are feeling better. Please continue your dialysis as scheduled.  Your leg pain is from fluid build up around your legs.   Please continue elevating your legs at home above the level of your chest to move the fluid.   We will also try compression socks or Ted Hose to help with this.  Please try to limit the amount of fluid and salt in your diet.  You can try Tylenol 1000 mg every 8 hours for your pain. Do not take more than 3000 mg in one day.   Edema Edema is an abnormal buildup of fluids. It is more common in your legs and thighs. Painless swelling of the feet and ankles is more likely as a person ages. It also is common in looser skin, like around your eyes. Follow these instructions at home:  Keep the affected body part above the level of the heart while lying down.  Do not sit still or stand for a long time.  Do not put anything right under your knees when you lie down.  Do not wear tight clothes on your upper legs.  Exercise your legs to help the puffiness (swelling) go down.  Wear elastic bandages or support stockings as told by your doctor.  A low-salt diet may help lessen the puffiness.  Only take medicine as told by your doctor. Contact a doctor if:  Treatment is not working.  You have heart, liver, or kidney disease and notice that your skin looks puffy or shiny.  You have puffiness in your legs that does not get better when you raise your legs.  You have sudden weight gain for no reason. Get help right away if:  You have shortness of breath or chest pain.  You cannot breathe when you lie down.  You have pain, redness, or warmth in the areas that are puffy.  You have heart, liver, or kidney disease and get edema all of a sudden.  You have a fever and your symptoms get worse all of a sudden. This information is not intended to replace advice given to you by your health care provider. Make sure you  discuss any questions you have with your health care provider. Document Released: 10/07/2007 Document Revised: 09/26/2015 Document Reviewed: 02/10/2013 Elsevier Interactive Patient Education  2017 [employer].

## 2016-04-16 NOTE — Assessment & Plan Note (Addendum)
Patient admitted from 03/30/2016-04/07/2016 for anasarca and uremia secondary to CKD V requiring emergent initiation of hemodialysis. AVF was placed in LUE on 04/07/16 and he currently receives HD through a tunneled dialysis catheter. He reports adherence to his HD schedule (MWF) without any missed sessions since discharge and says he is tolerating dialysis well. He says his breathing is much improved. He has continued swelling of both his legs which is also improved but still painful. He says the swelling improves with elevation. He is trying to limit his fluid intake.  Anasarca and breathing reportedly much improved since starting dialysis. He says his weight was 85 kg after dialysis yesterday, 84 kg today. Lungs are clear on exam. He does have continued edematous lower extremities which is reportedly somewhat improved as well. His LUE AVF is well healed and he has a f/u appt with vascular surgery, Dr. Bridgett Larsson, on 05/22/16. Patient counseled on elevating legs to reduce lower extremity edema. Compressions socks provided. -Continue HD MWF per nephrology -Fluid/salt restriction -Elevate legs above level of heart -Compression socks provided -f/u with [facility] as scheduled

## 2016-04-16 NOTE — Progress Notes (Signed)
Medicine attending: Medical history, presenting problems, physical findings, and medications, reviewed with resident physician Dr Vishal Patel on the day of the patient visit and I concur with his evaluation and management plan. 

## 2016-04-16 NOTE — Assessment & Plan Note (Signed)
Patient reports adherence to Amlodipine 10 mg daily, Coreg 25 mg BID, and Hydralazine 50 mg TID. BP is 151/88 today.  BP Readings from Last 3 Encounters:  04/16/16 (!) 151/88  04/07/16 (!) 146/95  01/22/16 132/83   BP slightly elevated today. He reports adherence to his medications. Will continue current meds for now as may improve with continued dialysis.

## 2016-04-16 NOTE — Progress Notes (Signed)
CC: HTN  HPI:  [name] is a 45 y.o. male with PMH of ESRD on HD, HFrEF (EF 35-40%), and HTN who presents for hospital follow up and management of his HTN.  ESRD on new HD: Patient admitted from 03/30/2016-04/07/2016 for anasarca and uremia secondary to CKD V requiring emergent initiation of hemodialysis. AVF was placed in LUE on 04/07/16 and he currently receives HD through a tunneled dialysis catheter. He reports adherence to his HD schedule without any missed sessions since discharge and says he is tolerating dialysis well. He says his breathing is much improved. He has continued swelling of both his legs which is also improved but still painful. He says the swelling improves with elevation. He is trying to limit his fluid intake.   HTN: Patient reports adherence to Amlodipine 10 mg daily, Coreg 25 mg BID, and Hydralazine 50 mg TID. BP is 151/88 today.  Social Hx: He lives in [place] with his wife and kids. 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 Reports former tobacco, EtOH, cocaine, marijuana, and heroin use (no IV drug use) quitting 20 years ago.  Past Medical History:  Diagnosis Date  . CHF (congestive heart failure) ([place])   . Hypertension   . Renal disorder    Social History   Social History  . Marital status: Married    Spouse name: N/A  . Number of children: N/A  . Years of education: N/A   Occupational History  . Not on file.   Social History Main Topics  . Smoking status: Former Smoker    Packs/day: 0.10    Years: 5.00    Types: Cigarettes  . Smokeless tobacco: Never Used     Comment: quit  smoking 20 years ago "  . Alcohol use No     Comment: Quit 20 years ago  . Drug use: No     Comment: Former cocaine, marijuana, heroin use 20 years ago; denies IV drug use  . Sexual activity: Not on file   Other Topics Concern  . Not on file   Social History Narrative  . No narrative on file   Family History  Problem  Relation Age of Onset  . Kidney failure Mother     was on dialysis      Review of Systems:   Review of Systems  Constitutional: Negative for chills and fever.  Respiratory: Negative for shortness of breath.   Cardiovascular: Positive for leg swelling. Negative for chest pain and palpitations.  Skin: Negative for rash.  Neurological: Negative for tingling.  Psychiatric/Behavioral: Negative for substance abuse.     Physical Exam:  Vitals:   04/16/16 0839  BP: (!) 151/88  Pulse: 93  Temp: 97.8 F (36.6 C)  TempSrc: Oral  SpO2: 98%  Weight: 185 lb 9.6 oz (84.2 kg)  Height: 5\' 2"  (1.575 m)   Physical Exam  Constitutional: He is oriented to person, place, and time. He appears well-developed and well-nourished. No distress.  Cardiovascular: Normal rate and regular rhythm.   No murmur heard. AVF LUE with palpable thrill and audible bruit  Pulmonary/Chest: Effort normal. No respiratory distress. He has no wheezes. He has no rales.  Tunneled cath in place right chest wall  Musculoskeletal:  +2-3 pitting edema from ankles to tibial tuberosity bilaterally, tender to palpation over the shins.  Neurological: He is alert and oriented to person, place, and time.  Skin: Skin is warm and dry. No rash noted. He is  not diaphoretic. No erythema.    Assessment & Plan:   See Encounters Tab for problem based charting.  Patient discussed with Dr. Beryle Beams

## 2016-05-19 ENCOUNTER — Encounter: Payer: Self-pay | Admitting: Vascular Surgery

## 2016-05-21 NOTE — Progress Notes (Signed)
    Postoperative Access Visit   History of Present Illness  Zalman Hull is a 46 y.o. year old male who presents for postoperative follow-up for: RIJV TDC exchange, L BC AVF (Date: 04/07/16).  The patient's wounds are healed.  The patient notes no steal symptoms.  The patient is able to complete their activities of daily living.  The patient's current symptoms are: sharp racing pain down R leg.  For VQI Use Only  PRE-ADM LIVING: Home  AMB STATUS: Ambulatory   Physical Examination Vitals:   05/22/16 0821 05/22/16 0822  BP: (!) 201/120 (!) 199/124  Pulse: 77   Resp: 20   Temp: 97.5 F (36.4 C)     LUE: Incision is healed, skin feels warm, hand grip is 5/5, sensation in digits is intact, palpable thrill, bruit can be auscultated, on Sonosite fistula > 6 mm  Medical Decision Making  Mahin Guardia is a 46 y.o. year old male who presents s/p RIJV TDC exchange, L BC AVF.   The patient's access is ready for use.  The patient's tunneled dialysis catheter can be removed after two successful cannulations and completed dialysis treatments.  Pt's right leg sx are unrelated to his access procedure.  Further evaluation for a spinal etiology might be fruitful.  Thank you for allowing [place] to participate in this patient's care.  Adele Barthel, MD, FACS [facility] Office: [phone] Pager: [phone]

## 2016-05-22 ENCOUNTER — Encounter: Payer: Self-pay | Admitting: Vascular Surgery

## 2016-05-22 ENCOUNTER — Ambulatory Visit (INDEPENDENT_AMBULATORY_CARE_PROVIDER_SITE_OTHER): Payer: Self-pay | Admitting: Vascular Surgery

## 2016-05-22 VITALS — BP 199/124 | HR 77 | Temp 97.5°F | Resp 20 | Ht 62.0 in | Wt 164.6 lb

## 2016-05-22 DIAGNOSIS — N186 End stage renal disease: Secondary | ICD-10-CM

## 2016-05-22 DIAGNOSIS — Z992 Dependence on renal dialysis: Secondary | ICD-10-CM

## 2016-06-03 ENCOUNTER — Telehealth: Payer: Self-pay | Admitting: General Practice

## 2016-06-03 NOTE — Telephone Encounter (Signed)
APT. REMINDER CALL, LMTCB °

## 2016-06-04 ENCOUNTER — Ambulatory Visit (INDEPENDENT_AMBULATORY_CARE_PROVIDER_SITE_OTHER): Payer: Medicare Other | Admitting: Internal Medicine

## 2016-06-04 VITALS — BP 165/108 | HR 79 | Temp 98.1°F | Ht 62.5 in | Wt 157.4 lb

## 2016-06-04 DIAGNOSIS — M79662 Pain in left lower leg: Secondary | ICD-10-CM

## 2016-06-04 DIAGNOSIS — R6889 Other general symptoms and signs: Secondary | ICD-10-CM

## 2016-06-04 DIAGNOSIS — E559 Vitamin D deficiency, unspecified: Secondary | ICD-10-CM | POA: Diagnosis not present

## 2016-06-04 DIAGNOSIS — I132 Hypertensive heart and chronic kidney disease with heart failure and with stage 5 chronic kidney disease, or end stage renal disease: Secondary | ICD-10-CM | POA: Diagnosis not present

## 2016-06-04 DIAGNOSIS — M25562 Pain in left knee: Secondary | ICD-10-CM | POA: Diagnosis not present

## 2016-06-04 DIAGNOSIS — M79661 Pain in right lower leg: Secondary | ICD-10-CM | POA: Diagnosis not present

## 2016-06-04 DIAGNOSIS — M25561 Pain in right knee: Secondary | ICD-10-CM | POA: Diagnosis not present

## 2016-06-04 DIAGNOSIS — G609 Hereditary and idiopathic neuropathy, unspecified: Secondary | ICD-10-CM | POA: Diagnosis not present

## 2016-06-04 DIAGNOSIS — N186 End stage renal disease: Secondary | ICD-10-CM

## 2016-06-04 DIAGNOSIS — N185 Chronic kidney disease, stage 5: Secondary | ICD-10-CM

## 2016-06-04 DIAGNOSIS — I509 Heart failure, unspecified: Secondary | ICD-10-CM | POA: Diagnosis not present

## 2016-06-04 MED ORDER — GABAPENTIN 100 MG PO CAPS: 100.0000 mg | ORAL_CAPSULE | Freq: Every day | ORAL | 2 refills | Status: DC

## 2016-06-04 NOTE — Assessment & Plan Note (Addendum)
The leg pain began 2 months ago, it is a burning and sharp pain over the anterior aspect of both of his lower legs. The pain is constant throughout the day but changes in intensity from 6-10 thoughout the day. It is worse when he stands up after sitting for an extended period of time, and when he walks up stairs or walks for an extended period. The pain disturbs his sleep at time. The pain is associated with tingling and decreased sensation to touch. He has chronic lower extremity swelling. He has tried ibuproven, advil and bengay, none of which have worked to relieve the pain. He did feel relief with the pain medication that he was given during prior  Hospitalization (chart review shows that this may have been prednisone for gout.)   Possible causes include pain related to chronic swelling, arterial insufficiency, or neuropathy. May be multifactorial and a combination of these factors. Will begin with ankle brachial index testing to assess for arterial disease. Considering common causes of neuropathy, he has had random blood sugars <120 on recent lab work, non reactive HIV and TSH 3.2 in September of last year. Will order vitamin D, vitamin B12, and RPR to further assess for neuropathy. Can consider electromyogram testing if these test are not conclusive.   -prescribed gabapentin 100-300 mg daily  - follow up ABI testing  -follow up vit D, B12, and RPR testing   Addendum: Vitamin D level 19 with non reactive RPR and B12 WNL. ABI testing revealed normal arterial flow bilateral.  - ordered cholecalciferol 400 mg daily  - will need to check for repletion in 1-2 months

## 2016-06-04 NOTE — Progress Notes (Signed)
   CC: lower leg pain   HPI: [name] is a 46 y.o. with past medical history CHF, HTN, and ESRDwho presents to clinic for follow up of lower leg pain. The leg pain began 2 months ago, it is a burning and sharp pain over the anterior aspect of both of his lower legs. The pain is constant throughout the day but changes in intensity from 6-10 thoughout the day. It is worse when he stands up after sitting for an extended period of time, and when he walks up stairs or walks for an extended period. The pain disturbs his sleep at time. The pain is associated with tingling and decreased sensation to touch. He has chronic lower extremity swelling. He has tried ibuproven, advil and bengay, none of which have worked to relieve the pain. He did feel relief with the pain medication that he was given during prior  Hospitalization (chart review shows that this may have been prednisone for gout.)   Please see problem list for status of the pt's chronic medical problems.  Review of Systems:  Please see each problem below for a pertinent review of systems.  Physical Exam:  Vitals:   06/04/16 0824  BP: (!) 165/108  Pulse: 79  Temp: 98.1 F (36.7 C)  TempSrc: Oral  SpO2: 100%  Weight: 157 lb 6.4 oz (71.4 kg)  Height: 5' 2.5" (1.588 m)   Physical Exam  Constitutional: He is oriented to person, place, and time. He appears well-developed and well-nourished. No distress.  Eyes: Conjunctivae are normal. No scleral icterus.  Cardiovascular:  2+ b/l lower extremity pitting edema   Musculoskeletal:  Strength and flexion and extension in both lower extremities intact and equal.   Neurological: He is alert and oriented to person, place, and time.  Decreased sensation to fine touch with a pen cap from the ankle to about 2 inches below the knee  Tenderness with deep palpation   Skin: Skin is warm and dry. He is not diaphoretic.  Darkened, glossy skin over anterior lower legs, with less hair present  Non  erythema, warmth, or calf tenderness    Psychiatric: He has a normal mood and affect. His behavior is normal.   Assessment & Plan:   See Encounters Tab for problem based charting.  Lower leg pain  Possible causes include pain related to chronic swelling, arterial insufficiency, or neuropathy. May be multifactorial and a combination of these factors. Will begin with ankle brachial index testing to assess for arterial disease. Considering common causes of neuropathy, he has had random blood sugars <120 on recent lab work, non reactive HIV and TSH 3.2 in September of last year. Will order vitamin D, vitamin B12, and RPR to further assess for neuropathy. Can consider electromyogram testing if these test are not conclusive.   -prescribed gabapentin 100-300 mg daily  - follow up ABI testing  -follow up vit D, B12, and RPR testing   Patient discussed with Dr. Daryll Drown

## 2016-06-04 NOTE — Patient Instructions (Addendum)
It was a pleasure to meet you today Cameron [name],   For your leg pain:  Try taking gabapentin, take no more than 300 mg (3 tablets) daily   Schedule an appointment to come back to the clinic after you have gone for arterial brachial index testing.

## 2016-06-05 DIAGNOSIS — N186 End stage renal disease: Secondary | ICD-10-CM | POA: Diagnosis not present

## 2016-06-05 DIAGNOSIS — D509 Iron deficiency anemia, unspecified: Secondary | ICD-10-CM | POA: Diagnosis not present

## 2016-06-05 DIAGNOSIS — N2581 Secondary hyperparathyroidism of renal origin: Secondary | ICD-10-CM | POA: Diagnosis not present

## 2016-06-05 DIAGNOSIS — D631 Anemia in chronic kidney disease: Secondary | ICD-10-CM | POA: Diagnosis not present

## 2016-06-05 LAB — VITAMIN B12: Vitamin B-12: 758 pg/mL (ref 232–1245)

## 2016-06-05 LAB — RPR: RPR Ser Ql: NONREACTIVE

## 2016-06-05 LAB — VITAMIN D 25 HYDROXY (VIT D DEFICIENCY, FRACTURES): VIT D 25 HYDROXY: 19.2 ng/mL — AB (ref 30.0–100.0)

## 2016-06-08 DIAGNOSIS — N2581 Secondary hyperparathyroidism of renal origin: Secondary | ICD-10-CM | POA: Diagnosis not present

## 2016-06-08 DIAGNOSIS — N186 End stage renal disease: Secondary | ICD-10-CM | POA: Diagnosis not present

## 2016-06-08 DIAGNOSIS — D631 Anemia in chronic kidney disease: Secondary | ICD-10-CM | POA: Diagnosis not present

## 2016-06-08 DIAGNOSIS — D509 Iron deficiency anemia, unspecified: Secondary | ICD-10-CM | POA: Diagnosis not present

## 2016-06-09 DIAGNOSIS — E559 Vitamin D deficiency, unspecified: Secondary | ICD-10-CM | POA: Insufficient documentation

## 2016-06-09 MED ORDER — CHOLECALCIFEROL 10 MCG (400 UNIT) PO TABS: 400.0000 [IU] | ORAL_TABLET | Freq: Every day | ORAL | 1 refills | Status: DC

## 2016-06-09 NOTE — Addendum Note (Signed)
Addended by: Meryl Dare on: 06/09/2016 01:32 PM   Modules accepted: Orders

## 2016-06-09 NOTE — Progress Notes (Signed)
Internal Medicine Clinic Attending  Case discussed with Dr. Blum at the time of the visit.  We reviewed the resident's history and exam and pertinent patient test results.  I agree with the assessment, diagnosis, and plan of care documented in the resident's note. 

## 2016-06-10 DIAGNOSIS — D631 Anemia in chronic kidney disease: Secondary | ICD-10-CM | POA: Diagnosis not present

## 2016-06-10 DIAGNOSIS — D509 Iron deficiency anemia, unspecified: Secondary | ICD-10-CM | POA: Diagnosis not present

## 2016-06-10 DIAGNOSIS — N186 End stage renal disease: Secondary | ICD-10-CM | POA: Diagnosis not present

## 2016-06-10 DIAGNOSIS — N2581 Secondary hyperparathyroidism of renal origin: Secondary | ICD-10-CM | POA: Diagnosis not present

## 2016-06-11 ENCOUNTER — Ambulatory Visit (HOSPITAL_COMMUNITY)
Admission: RE | Admit: 2016-06-11 | Discharge: 2016-06-11 | Disposition: A | Discharge status: Home / Self Care | Payer: Medicare Other | Source: Ambulatory Visit | Attending: Internal Medicine | Admitting: Internal Medicine

## 2016-06-11 DIAGNOSIS — M25562 Pain in left knee: Secondary | ICD-10-CM | POA: Diagnosis not present

## 2016-06-11 DIAGNOSIS — M25561 Pain in right knee: Secondary | ICD-10-CM | POA: Insufficient documentation

## 2016-06-11 NOTE — Progress Notes (Signed)
VASCULAR LAB PRELIMINARY  ARTERIAL  ABI completed:    RIGHT    LEFT    PRESSURE WAVEFORM  PRESSURE WAVEFORM  BRACHIAL 197 Triphasic BRACHIAL Not obtained. HD access fistula waiting to mature   DP 216 Triphasic DP 212 Triphasic  PT 234 Triphasic PT 219 Triphasic    RIGHT LEFT  ABI 1.19 1.11   ABIs and Doppler waveforms indicate normal arterial flow bilaterally at rest  Cameron [name], RVS 06/11/2016, 10:00 AM

## 2016-06-12 DIAGNOSIS — N186 End stage renal disease: Secondary | ICD-10-CM | POA: Diagnosis not present

## 2016-06-12 DIAGNOSIS — D509 Iron deficiency anemia, unspecified: Secondary | ICD-10-CM | POA: Diagnosis not present

## 2016-06-12 DIAGNOSIS — D631 Anemia in chronic kidney disease: Secondary | ICD-10-CM | POA: Diagnosis not present

## 2016-06-12 DIAGNOSIS — N2581 Secondary hyperparathyroidism of renal origin: Secondary | ICD-10-CM | POA: Diagnosis not present

## 2016-06-15 DIAGNOSIS — N2581 Secondary hyperparathyroidism of renal origin: Secondary | ICD-10-CM | POA: Diagnosis not present

## 2016-06-15 DIAGNOSIS — D509 Iron deficiency anemia, unspecified: Secondary | ICD-10-CM | POA: Diagnosis not present

## 2016-06-15 DIAGNOSIS — D631 Anemia in chronic kidney disease: Secondary | ICD-10-CM | POA: Diagnosis not present

## 2016-06-15 DIAGNOSIS — N186 End stage renal disease: Secondary | ICD-10-CM | POA: Diagnosis not present

## 2016-06-17 DIAGNOSIS — N2581 Secondary hyperparathyroidism of renal origin: Secondary | ICD-10-CM | POA: Diagnosis not present

## 2016-06-17 DIAGNOSIS — D509 Iron deficiency anemia, unspecified: Secondary | ICD-10-CM | POA: Diagnosis not present

## 2016-06-17 DIAGNOSIS — D631 Anemia in chronic kidney disease: Secondary | ICD-10-CM | POA: Diagnosis not present

## 2016-06-17 DIAGNOSIS — N186 End stage renal disease: Secondary | ICD-10-CM | POA: Diagnosis not present

## 2016-06-19 DIAGNOSIS — N2581 Secondary hyperparathyroidism of renal origin: Secondary | ICD-10-CM | POA: Diagnosis not present

## 2016-06-19 DIAGNOSIS — D509 Iron deficiency anemia, unspecified: Secondary | ICD-10-CM | POA: Diagnosis not present

## 2016-06-19 DIAGNOSIS — N186 End stage renal disease: Secondary | ICD-10-CM | POA: Diagnosis not present

## 2016-06-19 DIAGNOSIS — D631 Anemia in chronic kidney disease: Secondary | ICD-10-CM | POA: Diagnosis not present

## 2016-06-22 DIAGNOSIS — D631 Anemia in chronic kidney disease: Secondary | ICD-10-CM | POA: Diagnosis not present

## 2016-06-22 DIAGNOSIS — N186 End stage renal disease: Secondary | ICD-10-CM | POA: Diagnosis not present

## 2016-06-22 DIAGNOSIS — D509 Iron deficiency anemia, unspecified: Secondary | ICD-10-CM | POA: Diagnosis not present

## 2016-06-22 DIAGNOSIS — N2581 Secondary hyperparathyroidism of renal origin: Secondary | ICD-10-CM | POA: Diagnosis not present

## 2016-06-24 DIAGNOSIS — D631 Anemia in chronic kidney disease: Secondary | ICD-10-CM | POA: Diagnosis not present

## 2016-06-24 DIAGNOSIS — D509 Iron deficiency anemia, unspecified: Secondary | ICD-10-CM | POA: Diagnosis not present

## 2016-06-24 DIAGNOSIS — N2581 Secondary hyperparathyroidism of renal origin: Secondary | ICD-10-CM | POA: Diagnosis not present

## 2016-06-24 DIAGNOSIS — N186 End stage renal disease: Secondary | ICD-10-CM | POA: Diagnosis not present

## 2016-06-26 DIAGNOSIS — D509 Iron deficiency anemia, unspecified: Secondary | ICD-10-CM | POA: Diagnosis not present

## 2016-06-26 DIAGNOSIS — D631 Anemia in chronic kidney disease: Secondary | ICD-10-CM | POA: Diagnosis not present

## 2016-06-26 DIAGNOSIS — N2581 Secondary hyperparathyroidism of renal origin: Secondary | ICD-10-CM | POA: Diagnosis not present

## 2016-06-26 DIAGNOSIS — N186 End stage renal disease: Secondary | ICD-10-CM | POA: Diagnosis not present

## 2016-06-29 DIAGNOSIS — D631 Anemia in chronic kidney disease: Secondary | ICD-10-CM | POA: Diagnosis not present

## 2016-06-29 DIAGNOSIS — D509 Iron deficiency anemia, unspecified: Secondary | ICD-10-CM | POA: Diagnosis not present

## 2016-06-29 DIAGNOSIS — N2581 Secondary hyperparathyroidism of renal origin: Secondary | ICD-10-CM | POA: Diagnosis not present

## 2016-06-29 DIAGNOSIS — N186 End stage renal disease: Secondary | ICD-10-CM | POA: Diagnosis not present

## 2016-07-01 DIAGNOSIS — N2581 Secondary hyperparathyroidism of renal origin: Secondary | ICD-10-CM | POA: Diagnosis not present

## 2016-07-01 DIAGNOSIS — D509 Iron deficiency anemia, unspecified: Secondary | ICD-10-CM | POA: Diagnosis not present

## 2016-07-01 DIAGNOSIS — I129 Hypertensive chronic kidney disease with stage 1 through stage 4 chronic kidney disease, or unspecified chronic kidney disease: Secondary | ICD-10-CM | POA: Diagnosis not present

## 2016-07-01 DIAGNOSIS — Z992 Dependence on renal dialysis: Secondary | ICD-10-CM | POA: Diagnosis not present

## 2016-07-01 DIAGNOSIS — N186 End stage renal disease: Secondary | ICD-10-CM | POA: Diagnosis not present

## 2016-07-01 DIAGNOSIS — D631 Anemia in chronic kidney disease: Secondary | ICD-10-CM | POA: Diagnosis not present

## 2016-07-03 DIAGNOSIS — D509 Iron deficiency anemia, unspecified: Secondary | ICD-10-CM | POA: Diagnosis not present

## 2016-07-03 DIAGNOSIS — D631 Anemia in chronic kidney disease: Secondary | ICD-10-CM | POA: Diagnosis not present

## 2016-07-03 DIAGNOSIS — N2581 Secondary hyperparathyroidism of renal origin: Secondary | ICD-10-CM | POA: Diagnosis not present

## 2016-07-03 DIAGNOSIS — N186 End stage renal disease: Secondary | ICD-10-CM | POA: Diagnosis not present

## 2016-07-06 DIAGNOSIS — N2581 Secondary hyperparathyroidism of renal origin: Secondary | ICD-10-CM | POA: Diagnosis not present

## 2016-07-06 DIAGNOSIS — D509 Iron deficiency anemia, unspecified: Secondary | ICD-10-CM | POA: Diagnosis not present

## 2016-07-06 DIAGNOSIS — N186 End stage renal disease: Secondary | ICD-10-CM | POA: Diagnosis not present

## 2016-07-06 DIAGNOSIS — D631 Anemia in chronic kidney disease: Secondary | ICD-10-CM | POA: Diagnosis not present

## 2016-07-08 DIAGNOSIS — D631 Anemia in chronic kidney disease: Secondary | ICD-10-CM | POA: Diagnosis not present

## 2016-07-08 DIAGNOSIS — D509 Iron deficiency anemia, unspecified: Secondary | ICD-10-CM | POA: Diagnosis not present

## 2016-07-08 DIAGNOSIS — N186 End stage renal disease: Secondary | ICD-10-CM | POA: Diagnosis not present

## 2016-07-08 DIAGNOSIS — N2581 Secondary hyperparathyroidism of renal origin: Secondary | ICD-10-CM | POA: Diagnosis not present

## 2016-07-09 DIAGNOSIS — N2581 Secondary hyperparathyroidism of renal origin: Secondary | ICD-10-CM | POA: Diagnosis not present

## 2016-07-09 DIAGNOSIS — E8770 Fluid overload, unspecified: Secondary | ICD-10-CM | POA: Diagnosis not present

## 2016-07-09 DIAGNOSIS — N186 End stage renal disease: Secondary | ICD-10-CM | POA: Diagnosis not present

## 2016-07-10 DIAGNOSIS — D631 Anemia in chronic kidney disease: Secondary | ICD-10-CM | POA: Diagnosis not present

## 2016-07-10 DIAGNOSIS — N186 End stage renal disease: Secondary | ICD-10-CM | POA: Diagnosis not present

## 2016-07-10 DIAGNOSIS — N2581 Secondary hyperparathyroidism of renal origin: Secondary | ICD-10-CM | POA: Diagnosis not present

## 2016-07-10 DIAGNOSIS — D509 Iron deficiency anemia, unspecified: Secondary | ICD-10-CM | POA: Diagnosis not present

## 2016-07-13 DIAGNOSIS — N186 End stage renal disease: Secondary | ICD-10-CM | POA: Diagnosis not present

## 2016-07-13 DIAGNOSIS — D631 Anemia in chronic kidney disease: Secondary | ICD-10-CM | POA: Diagnosis not present

## 2016-07-13 DIAGNOSIS — N2581 Secondary hyperparathyroidism of renal origin: Secondary | ICD-10-CM | POA: Diagnosis not present

## 2016-07-13 DIAGNOSIS — D509 Iron deficiency anemia, unspecified: Secondary | ICD-10-CM | POA: Diagnosis not present

## 2016-07-15 DIAGNOSIS — D509 Iron deficiency anemia, unspecified: Secondary | ICD-10-CM | POA: Diagnosis not present

## 2016-07-15 DIAGNOSIS — D631 Anemia in chronic kidney disease: Secondary | ICD-10-CM | POA: Diagnosis not present

## 2016-07-15 DIAGNOSIS — N2581 Secondary hyperparathyroidism of renal origin: Secondary | ICD-10-CM | POA: Diagnosis not present

## 2016-07-15 DIAGNOSIS — N186 End stage renal disease: Secondary | ICD-10-CM | POA: Diagnosis not present

## 2016-07-16 DIAGNOSIS — Z992 Dependence on renal dialysis: Secondary | ICD-10-CM | POA: Diagnosis not present

## 2016-07-16 DIAGNOSIS — I872 Venous insufficiency (chronic) (peripheral): Secondary | ICD-10-CM | POA: Diagnosis not present

## 2016-07-16 DIAGNOSIS — T82858A Stenosis of vascular prosthetic devices, implants and grafts, initial encounter: Secondary | ICD-10-CM | POA: Diagnosis not present

## 2016-07-16 DIAGNOSIS — N186 End stage renal disease: Secondary | ICD-10-CM | POA: Diagnosis not present

## 2016-07-17 DIAGNOSIS — N186 End stage renal disease: Secondary | ICD-10-CM | POA: Diagnosis not present

## 2016-07-17 DIAGNOSIS — N2581 Secondary hyperparathyroidism of renal origin: Secondary | ICD-10-CM | POA: Diagnosis not present

## 2016-07-17 DIAGNOSIS — D509 Iron deficiency anemia, unspecified: Secondary | ICD-10-CM | POA: Diagnosis not present

## 2016-07-17 DIAGNOSIS — D631 Anemia in chronic kidney disease: Secondary | ICD-10-CM | POA: Diagnosis not present

## 2016-07-20 DIAGNOSIS — D631 Anemia in chronic kidney disease: Secondary | ICD-10-CM | POA: Diagnosis not present

## 2016-07-20 DIAGNOSIS — N2581 Secondary hyperparathyroidism of renal origin: Secondary | ICD-10-CM | POA: Diagnosis not present

## 2016-07-20 DIAGNOSIS — D509 Iron deficiency anemia, unspecified: Secondary | ICD-10-CM | POA: Diagnosis not present

## 2016-07-20 DIAGNOSIS — N186 End stage renal disease: Secondary | ICD-10-CM | POA: Diagnosis not present

## 2016-07-22 DIAGNOSIS — N186 End stage renal disease: Secondary | ICD-10-CM | POA: Diagnosis not present

## 2016-07-22 DIAGNOSIS — N2581 Secondary hyperparathyroidism of renal origin: Secondary | ICD-10-CM | POA: Diagnosis not present

## 2016-07-22 DIAGNOSIS — D509 Iron deficiency anemia, unspecified: Secondary | ICD-10-CM | POA: Diagnosis not present

## 2016-07-22 DIAGNOSIS — D631 Anemia in chronic kidney disease: Secondary | ICD-10-CM | POA: Diagnosis not present

## 2016-07-24 DIAGNOSIS — N2581 Secondary hyperparathyroidism of renal origin: Secondary | ICD-10-CM | POA: Diagnosis not present

## 2016-07-24 DIAGNOSIS — D631 Anemia in chronic kidney disease: Secondary | ICD-10-CM | POA: Diagnosis not present

## 2016-07-24 DIAGNOSIS — N186 End stage renal disease: Secondary | ICD-10-CM | POA: Diagnosis not present

## 2016-07-24 DIAGNOSIS — D509 Iron deficiency anemia, unspecified: Secondary | ICD-10-CM | POA: Diagnosis not present

## 2016-07-27 ENCOUNTER — Telehealth: Payer: Self-pay | Admitting: Internal Medicine

## 2016-07-27 DIAGNOSIS — N186 End stage renal disease: Secondary | ICD-10-CM | POA: Diagnosis not present

## 2016-07-27 DIAGNOSIS — D631 Anemia in chronic kidney disease: Secondary | ICD-10-CM | POA: Diagnosis not present

## 2016-07-27 DIAGNOSIS — N2581 Secondary hyperparathyroidism of renal origin: Secondary | ICD-10-CM | POA: Diagnosis not present

## 2016-07-27 DIAGNOSIS — D509 Iron deficiency anemia, unspecified: Secondary | ICD-10-CM | POA: Diagnosis not present

## 2016-07-27 NOTE — Telephone Encounter (Signed)
APT. REMINDER CALL, LMTCB °

## 2016-07-28 ENCOUNTER — Encounter (INDEPENDENT_AMBULATORY_CARE_PROVIDER_SITE_OTHER): Payer: Self-pay

## 2016-07-28 ENCOUNTER — Encounter: Payer: Self-pay | Admitting: Internal Medicine

## 2016-07-28 ENCOUNTER — Ambulatory Visit (INDEPENDENT_AMBULATORY_CARE_PROVIDER_SITE_OTHER): Payer: Medicare Other | Admitting: Internal Medicine

## 2016-07-28 VITALS — BP 190/99 | HR 83 | Temp 98.2°F | Ht 62.0 in | Wt 178.6 lb

## 2016-07-28 DIAGNOSIS — I132 Hypertensive heart and chronic kidney disease with heart failure and with stage 5 chronic kidney disease, or end stage renal disease: Secondary | ICD-10-CM | POA: Diagnosis not present

## 2016-07-28 DIAGNOSIS — Z992 Dependence on renal dialysis: Secondary | ICD-10-CM

## 2016-07-28 DIAGNOSIS — M79662 Pain in left lower leg: Secondary | ICD-10-CM | POA: Diagnosis not present

## 2016-07-28 DIAGNOSIS — M79661 Pain in right lower leg: Secondary | ICD-10-CM | POA: Diagnosis not present

## 2016-07-28 DIAGNOSIS — Z79899 Other long term (current) drug therapy: Secondary | ICD-10-CM | POA: Diagnosis not present

## 2016-07-28 DIAGNOSIS — I272 Pulmonary hypertension, unspecified: Secondary | ICD-10-CM | POA: Insufficient documentation

## 2016-07-28 DIAGNOSIS — Z87891 Personal history of nicotine dependence: Secondary | ICD-10-CM

## 2016-07-28 DIAGNOSIS — N186 End stage renal disease: Secondary | ICD-10-CM

## 2016-07-28 DIAGNOSIS — E559 Vitamin D deficiency, unspecified: Secondary | ICD-10-CM | POA: Diagnosis not present

## 2016-07-28 DIAGNOSIS — I5042 Chronic combined systolic (congestive) and diastolic (congestive) heart failure: Secondary | ICD-10-CM

## 2016-07-28 DIAGNOSIS — I10 Essential (primary) hypertension: Secondary | ICD-10-CM

## 2016-07-28 DIAGNOSIS — Z0000 Encounter for general adult medical examination without abnormal findings: Secondary | ICD-10-CM | POA: Insufficient documentation

## 2016-07-28 MED ORDER — GABAPENTIN 300 MG PO CAPS: 300.0000 mg | ORAL_CAPSULE | Freq: Every day | ORAL | 1 refills | Status: AC

## 2016-07-28 MED ORDER — HYDRALAZINE HCL 100 MG PO TABS: 100.0000 mg | ORAL_TABLET | Freq: Three times a day (TID) | ORAL | 1 refills | Status: AC

## 2016-07-28 NOTE — Assessment & Plan Note (Addendum)
Assessment Vitamin D level checked during recent visit was low at 19. Patient is end-stage renal disease on hemodialysis and currently taking calcitriol 1 g every other day (seen in his medication bag). He was previously advised to take cholecalciferol 400 units daily but patient does not recall taking this medication.  Plan -Continue calcitriol  -Recheck vitamin D level at follow-up visit in 4 weeks. Consider adding additional supplementation if his vitamin D level continues to be low at that visit.

## 2016-07-28 NOTE — Assessment & Plan Note (Signed)
BP Readings from Last 3 Encounters:  07/28/16 (!) 190/99  06/04/16 (!) 165/108  05/22/16 (!) 199/124    Lab Results  Component Value Date   NA 136 04/07/2016   K 3.8 04/07/2016   CREATININE 6.57 (H) 04/07/2016    Assessment: Blood pressure control:  uncontrolled Comments: Patient is currently on Coreg 25 mg twice daily, hydralazine 50 mg 3 times a day, and clonidine 0.3 mg 3 times a day. He had his medication bag with him and stated all his blood pressure medicines are refilled by his nephrologist. Blood pressure noted to be elevated at this visit-initial 188/95, repeat 190/99. Patient denies having any symptoms such as headache, chest pain, shortness of breath, or abdominal pain. States his last dialysis session was yesterday and next is tomorrow. He reports compliance with dialysis and his medications.  Plan: Medications:  Increase dose of hydralazine to 100 mg 3 times a day. Continue Coreg and clonidine as above. His blood pressure is also expected to improve with dialysis tomorrow. Educational resources provided: brochure (denies ) Self management tools provided:   Educated patient about healthy eating and exercise. Emphasized the importance of weight loss.  Other plans:  -Return to clinic in 4 weeks for blood pressure recheck.

## 2016-07-28 NOTE — Assessment & Plan Note (Signed)
History of present illness Patient continues to complain of intermittent bilateral lower extremity pain at this visit. States his pain is located in the shin area and is sharp in nature. Reports having pain for the past few months since he started hemodialysis. However, patient does not believe the pain is related to his hemodialysis sessions and he is not having any cramping in his legs. States his lower extremity pain is worse with walking but does not improve with rest. The pain is present even at night. He denies having any fevers, chills, lower extremity edema, or shortness of breath. During his previous visit, patient was started on gabapentin 100 mg 1-3 times per day. States he is taking this medication twice a day with some improvement in his lower extremity pain. However, the pain still limits his ability to walk comfortably at work and he is requesting a disability parking placard.   Assessment Patient's lower extremity pain could likely be neuropathic in nature as he did have some response to gabapentin. Peripheral arterial disease less likely as recent ABI done 06/11/2016 was normal. Labs checked during previous visit were normal including B12 and TSH. HIV and RPR were nonreactive. Patient does not have diabetes and recent random blood glucose normal.  Plan -Dose of gabapentin has been increased to 300 mg at bedtime -If patient continues to complain of LE pain at next visit, consider ordering an EMG -Temporary disability parking card (6 mos)

## 2016-07-28 NOTE — Patient Instructions (Signed)
Your blood pressure is high:  Take Hydralazine 100 mg three times a day.  Continue taking Coreg and Clonidine as before   For the pain in your legs:  Take Gabapentin 300 mg daily at bedtime.

## 2016-07-28 NOTE — Progress Notes (Signed)
   CC: Patient is here to discuss his bilateral lower extremity pain.   HPI:  [name] is a 46 y.o. M with a PMHx of conditions listed below presenting to the clinic to discuss pain in his bilateral lower extremities. Hypertension, vitamin D deficiency, and chronic combined systolic and diastolic heart failure were also discussed during this visit. Please see problem based charting for the status of the patient's current and chronic medical conditions.   Past Medical History:  Diagnosis Date  . CHF (congestive heart failure) ([place])   . Hypertension   . Renal disorder     Review of Systems:  Pertinent positives mentioned in HPI. Remainder of all ROS negative.   Physical Exam:  Vitals:   07/28/16 1320 07/28/16 1356  BP: (!) 188/95 (!) 190/99  Pulse: 90 83  Temp: 98.2 F (36.8 C)   TempSrc: Oral   SpO2: 98%   Weight: 178 lb 9.6 oz (81 kg)   Height: 5\' 2"  (1.575 m)    Physical Exam  Constitutional: He is oriented to person, place, and time. He appears well-developed and well-nourished. No distress.  HENT:  Head: Normocephalic and atraumatic.  Eyes: Right eye exhibits no discharge. Left eye exhibits no discharge.  Neck: Neck supple. No tracheal deviation present.  Cardiovascular: Normal rate, regular rhythm and intact distal pulses.   Pulmonary/Chest: Effort normal and breath sounds normal. No respiratory distress. He has no wheezes. He has no rales.  Abdominal: Soft. Bowel sounds are normal. He exhibits no distension. There is no tenderness.  Musculoskeletal: Normal range of motion. He exhibits no edema.  Neurological: He is alert and oriented to person, place, and time.  Skin: Skin is warm and dry.    Assessment & Plan:   See Encounters Tab for problem based charting.  Patient discussed with Dr. Evette Doffing

## 2016-07-28 NOTE — Assessment & Plan Note (Addendum)
Assessment Stable. Patient denies having any shortness of breath, cough, lower extremity edema, or orthopnea.  Plan -Continue Coreg 25 mg twice daily

## 2016-07-29 DIAGNOSIS — D509 Iron deficiency anemia, unspecified: Secondary | ICD-10-CM | POA: Diagnosis not present

## 2016-07-29 DIAGNOSIS — N186 End stage renal disease: Secondary | ICD-10-CM | POA: Diagnosis not present

## 2016-07-29 DIAGNOSIS — N2581 Secondary hyperparathyroidism of renal origin: Secondary | ICD-10-CM | POA: Diagnosis not present

## 2016-07-29 DIAGNOSIS — D631 Anemia in chronic kidney disease: Secondary | ICD-10-CM | POA: Diagnosis not present

## 2016-07-29 NOTE — Progress Notes (Signed)
Internal Medicine Clinic Attending  Case discussed with Dr. Rathoreat the time of the visit. We reviewed the resident's history and exam and pertinent patient test results. I agree with the assessment, diagnosis, and plan of care documented in the resident's note.  

## 2016-07-31 DIAGNOSIS — D509 Iron deficiency anemia, unspecified: Secondary | ICD-10-CM | POA: Diagnosis not present

## 2016-07-31 DIAGNOSIS — N186 End stage renal disease: Secondary | ICD-10-CM | POA: Diagnosis not present

## 2016-07-31 DIAGNOSIS — N2581 Secondary hyperparathyroidism of renal origin: Secondary | ICD-10-CM | POA: Diagnosis not present

## 2016-07-31 DIAGNOSIS — D631 Anemia in chronic kidney disease: Secondary | ICD-10-CM | POA: Diagnosis not present

## 2016-08-01 DIAGNOSIS — N186 End stage renal disease: Secondary | ICD-10-CM | POA: Diagnosis not present

## 2016-08-01 DIAGNOSIS — Z992 Dependence on renal dialysis: Secondary | ICD-10-CM | POA: Diagnosis not present

## 2016-08-01 DIAGNOSIS — I129 Hypertensive chronic kidney disease with stage 1 through stage 4 chronic kidney disease, or unspecified chronic kidney disease: Secondary | ICD-10-CM | POA: Diagnosis not present

## 2016-08-03 DIAGNOSIS — D509 Iron deficiency anemia, unspecified: Secondary | ICD-10-CM | POA: Diagnosis not present

## 2016-08-03 DIAGNOSIS — D631 Anemia in chronic kidney disease: Secondary | ICD-10-CM | POA: Diagnosis not present

## 2016-08-03 DIAGNOSIS — N2581 Secondary hyperparathyroidism of renal origin: Secondary | ICD-10-CM | POA: Diagnosis not present

## 2016-08-03 DIAGNOSIS — N186 End stage renal disease: Secondary | ICD-10-CM | POA: Diagnosis not present

## 2016-08-05 DIAGNOSIS — N186 End stage renal disease: Secondary | ICD-10-CM | POA: Diagnosis not present

## 2016-08-05 DIAGNOSIS — D631 Anemia in chronic kidney disease: Secondary | ICD-10-CM | POA: Diagnosis not present

## 2016-08-05 DIAGNOSIS — N2581 Secondary hyperparathyroidism of renal origin: Secondary | ICD-10-CM | POA: Diagnosis not present

## 2016-08-05 DIAGNOSIS — D509 Iron deficiency anemia, unspecified: Secondary | ICD-10-CM | POA: Diagnosis not present

## 2016-08-07 DIAGNOSIS — N2581 Secondary hyperparathyroidism of renal origin: Secondary | ICD-10-CM | POA: Diagnosis not present

## 2016-08-07 DIAGNOSIS — D631 Anemia in chronic kidney disease: Secondary | ICD-10-CM | POA: Diagnosis not present

## 2016-08-07 DIAGNOSIS — D509 Iron deficiency anemia, unspecified: Secondary | ICD-10-CM | POA: Diagnosis not present

## 2016-08-07 DIAGNOSIS — N186 End stage renal disease: Secondary | ICD-10-CM | POA: Diagnosis not present

## 2016-08-10 DIAGNOSIS — D631 Anemia in chronic kidney disease: Secondary | ICD-10-CM | POA: Diagnosis not present

## 2016-08-10 DIAGNOSIS — N2581 Secondary hyperparathyroidism of renal origin: Secondary | ICD-10-CM | POA: Diagnosis not present

## 2016-08-10 DIAGNOSIS — N186 End stage renal disease: Secondary | ICD-10-CM | POA: Diagnosis not present

## 2016-08-10 DIAGNOSIS — D509 Iron deficiency anemia, unspecified: Secondary | ICD-10-CM | POA: Diagnosis not present

## 2016-08-11 ENCOUNTER — Other Ambulatory Visit: Payer: Self-pay

## 2016-08-12 DIAGNOSIS — D509 Iron deficiency anemia, unspecified: Secondary | ICD-10-CM | POA: Diagnosis not present

## 2016-08-12 DIAGNOSIS — D631 Anemia in chronic kidney disease: Secondary | ICD-10-CM | POA: Diagnosis not present

## 2016-08-12 DIAGNOSIS — N2581 Secondary hyperparathyroidism of renal origin: Secondary | ICD-10-CM | POA: Diagnosis not present

## 2016-08-12 DIAGNOSIS — N186 End stage renal disease: Secondary | ICD-10-CM | POA: Diagnosis not present

## 2016-08-14 DIAGNOSIS — D509 Iron deficiency anemia, unspecified: Secondary | ICD-10-CM | POA: Diagnosis not present

## 2016-08-14 DIAGNOSIS — N2581 Secondary hyperparathyroidism of renal origin: Secondary | ICD-10-CM | POA: Diagnosis not present

## 2016-08-14 DIAGNOSIS — Z452 Encounter for adjustment and management of vascular access device: Secondary | ICD-10-CM | POA: Diagnosis not present

## 2016-08-14 DIAGNOSIS — N186 End stage renal disease: Secondary | ICD-10-CM | POA: Diagnosis not present

## 2016-08-14 DIAGNOSIS — D631 Anemia in chronic kidney disease: Secondary | ICD-10-CM | POA: Diagnosis not present

## 2016-08-17 DIAGNOSIS — N2581 Secondary hyperparathyroidism of renal origin: Secondary | ICD-10-CM | POA: Diagnosis not present

## 2016-08-17 DIAGNOSIS — D509 Iron deficiency anemia, unspecified: Secondary | ICD-10-CM | POA: Diagnosis not present

## 2016-08-17 DIAGNOSIS — N186 End stage renal disease: Secondary | ICD-10-CM | POA: Diagnosis not present

## 2016-08-17 DIAGNOSIS — D631 Anemia in chronic kidney disease: Secondary | ICD-10-CM | POA: Diagnosis not present

## 2016-08-19 DIAGNOSIS — N186 End stage renal disease: Secondary | ICD-10-CM | POA: Diagnosis not present

## 2016-08-19 DIAGNOSIS — N2581 Secondary hyperparathyroidism of renal origin: Secondary | ICD-10-CM | POA: Diagnosis not present

## 2016-08-19 DIAGNOSIS — D631 Anemia in chronic kidney disease: Secondary | ICD-10-CM | POA: Diagnosis not present

## 2016-08-19 DIAGNOSIS — D509 Iron deficiency anemia, unspecified: Secondary | ICD-10-CM | POA: Diagnosis not present

## 2016-08-20 ENCOUNTER — Inpatient Hospital Stay (HOSPITAL_COMMUNITY): Admission: RE | Admit: 2016-08-20 | Payer: Medicare Other | Source: Ambulatory Visit

## 2016-08-21 DIAGNOSIS — D631 Anemia in chronic kidney disease: Secondary | ICD-10-CM | POA: Diagnosis not present

## 2016-08-21 DIAGNOSIS — D509 Iron deficiency anemia, unspecified: Secondary | ICD-10-CM | POA: Diagnosis not present

## 2016-08-21 DIAGNOSIS — N186 End stage renal disease: Secondary | ICD-10-CM | POA: Diagnosis not present

## 2016-08-21 DIAGNOSIS — N2581 Secondary hyperparathyroidism of renal origin: Secondary | ICD-10-CM | POA: Diagnosis not present

## 2016-08-24 DIAGNOSIS — N186 End stage renal disease: Secondary | ICD-10-CM | POA: Diagnosis not present

## 2016-08-24 DIAGNOSIS — D509 Iron deficiency anemia, unspecified: Secondary | ICD-10-CM | POA: Diagnosis not present

## 2016-08-24 DIAGNOSIS — N2581 Secondary hyperparathyroidism of renal origin: Secondary | ICD-10-CM | POA: Diagnosis not present

## 2016-08-24 DIAGNOSIS — D631 Anemia in chronic kidney disease: Secondary | ICD-10-CM | POA: Diagnosis not present

## 2016-08-26 DIAGNOSIS — N2581 Secondary hyperparathyroidism of renal origin: Secondary | ICD-10-CM | POA: Diagnosis not present

## 2016-08-26 DIAGNOSIS — N186 End stage renal disease: Secondary | ICD-10-CM | POA: Diagnosis not present

## 2016-08-26 DIAGNOSIS — D631 Anemia in chronic kidney disease: Secondary | ICD-10-CM | POA: Diagnosis not present

## 2016-08-26 DIAGNOSIS — D509 Iron deficiency anemia, unspecified: Secondary | ICD-10-CM | POA: Diagnosis not present

## 2016-08-28 DIAGNOSIS — N186 End stage renal disease: Secondary | ICD-10-CM | POA: Diagnosis not present

## 2016-08-28 DIAGNOSIS — N2581 Secondary hyperparathyroidism of renal origin: Secondary | ICD-10-CM | POA: Diagnosis not present

## 2016-08-28 DIAGNOSIS — D631 Anemia in chronic kidney disease: Secondary | ICD-10-CM | POA: Diagnosis not present

## 2016-08-28 DIAGNOSIS — D509 Iron deficiency anemia, unspecified: Secondary | ICD-10-CM | POA: Diagnosis not present

## 2016-08-31 DIAGNOSIS — N186 End stage renal disease: Secondary | ICD-10-CM | POA: Diagnosis not present

## 2016-08-31 DIAGNOSIS — Z992 Dependence on renal dialysis: Secondary | ICD-10-CM | POA: Diagnosis not present

## 2016-08-31 DIAGNOSIS — N2581 Secondary hyperparathyroidism of renal origin: Secondary | ICD-10-CM | POA: Diagnosis not present

## 2016-08-31 DIAGNOSIS — D509 Iron deficiency anemia, unspecified: Secondary | ICD-10-CM | POA: Diagnosis not present

## 2016-08-31 DIAGNOSIS — I129 Hypertensive chronic kidney disease with stage 1 through stage 4 chronic kidney disease, or unspecified chronic kidney disease: Secondary | ICD-10-CM | POA: Diagnosis not present

## 2016-08-31 DIAGNOSIS — D631 Anemia in chronic kidney disease: Secondary | ICD-10-CM | POA: Diagnosis not present

## 2016-09-02 DIAGNOSIS — D631 Anemia in chronic kidney disease: Secondary | ICD-10-CM | POA: Diagnosis not present

## 2016-09-02 DIAGNOSIS — N186 End stage renal disease: Secondary | ICD-10-CM | POA: Diagnosis not present

## 2016-09-02 DIAGNOSIS — N2581 Secondary hyperparathyroidism of renal origin: Secondary | ICD-10-CM | POA: Diagnosis not present

## 2016-09-02 DIAGNOSIS — D509 Iron deficiency anemia, unspecified: Secondary | ICD-10-CM | POA: Diagnosis not present

## 2016-09-07 DIAGNOSIS — N2581 Secondary hyperparathyroidism of renal origin: Secondary | ICD-10-CM | POA: Diagnosis not present

## 2016-09-07 DIAGNOSIS — N186 End stage renal disease: Secondary | ICD-10-CM | POA: Diagnosis not present

## 2016-09-07 DIAGNOSIS — D631 Anemia in chronic kidney disease: Secondary | ICD-10-CM | POA: Diagnosis not present

## 2016-09-07 DIAGNOSIS — D509 Iron deficiency anemia, unspecified: Secondary | ICD-10-CM | POA: Diagnosis not present

## 2016-09-09 DIAGNOSIS — N2581 Secondary hyperparathyroidism of renal origin: Secondary | ICD-10-CM | POA: Diagnosis not present

## 2016-09-09 DIAGNOSIS — N186 End stage renal disease: Secondary | ICD-10-CM | POA: Diagnosis not present

## 2016-09-09 DIAGNOSIS — D631 Anemia in chronic kidney disease: Secondary | ICD-10-CM | POA: Diagnosis not present

## 2016-09-09 DIAGNOSIS — D509 Iron deficiency anemia, unspecified: Secondary | ICD-10-CM | POA: Diagnosis not present

## 2016-09-11 DIAGNOSIS — D509 Iron deficiency anemia, unspecified: Secondary | ICD-10-CM | POA: Diagnosis not present

## 2016-09-11 DIAGNOSIS — N186 End stage renal disease: Secondary | ICD-10-CM | POA: Diagnosis not present

## 2016-09-11 DIAGNOSIS — D631 Anemia in chronic kidney disease: Secondary | ICD-10-CM | POA: Diagnosis not present

## 2016-09-11 DIAGNOSIS — N2581 Secondary hyperparathyroidism of renal origin: Secondary | ICD-10-CM | POA: Diagnosis not present

## 2016-09-14 DIAGNOSIS — D631 Anemia in chronic kidney disease: Secondary | ICD-10-CM | POA: Diagnosis not present

## 2016-09-14 DIAGNOSIS — N186 End stage renal disease: Secondary | ICD-10-CM | POA: Diagnosis not present

## 2016-09-14 DIAGNOSIS — D509 Iron deficiency anemia, unspecified: Secondary | ICD-10-CM | POA: Diagnosis not present

## 2016-09-14 DIAGNOSIS — N2581 Secondary hyperparathyroidism of renal origin: Secondary | ICD-10-CM | POA: Diagnosis not present

## 2016-09-16 DIAGNOSIS — D631 Anemia in chronic kidney disease: Secondary | ICD-10-CM | POA: Diagnosis not present

## 2016-09-16 DIAGNOSIS — N2581 Secondary hyperparathyroidism of renal origin: Secondary | ICD-10-CM | POA: Diagnosis not present

## 2016-09-16 DIAGNOSIS — D509 Iron deficiency anemia, unspecified: Secondary | ICD-10-CM | POA: Diagnosis not present

## 2016-09-16 DIAGNOSIS — N186 End stage renal disease: Secondary | ICD-10-CM | POA: Diagnosis not present

## 2016-09-18 DIAGNOSIS — N2581 Secondary hyperparathyroidism of renal origin: Secondary | ICD-10-CM | POA: Diagnosis not present

## 2016-09-18 DIAGNOSIS — N186 End stage renal disease: Secondary | ICD-10-CM | POA: Diagnosis not present

## 2016-09-18 DIAGNOSIS — D509 Iron deficiency anemia, unspecified: Secondary | ICD-10-CM | POA: Diagnosis not present

## 2016-09-18 DIAGNOSIS — D631 Anemia in chronic kidney disease: Secondary | ICD-10-CM | POA: Diagnosis not present

## 2016-09-21 DIAGNOSIS — N186 End stage renal disease: Secondary | ICD-10-CM | POA: Diagnosis not present

## 2016-09-21 DIAGNOSIS — D631 Anemia in chronic kidney disease: Secondary | ICD-10-CM | POA: Diagnosis not present

## 2016-09-21 DIAGNOSIS — N2581 Secondary hyperparathyroidism of renal origin: Secondary | ICD-10-CM | POA: Diagnosis not present

## 2016-09-21 DIAGNOSIS — D509 Iron deficiency anemia, unspecified: Secondary | ICD-10-CM | POA: Diagnosis not present

## 2016-09-23 DIAGNOSIS — D631 Anemia in chronic kidney disease: Secondary | ICD-10-CM | POA: Diagnosis not present

## 2016-09-23 DIAGNOSIS — N186 End stage renal disease: Secondary | ICD-10-CM | POA: Diagnosis not present

## 2016-09-23 DIAGNOSIS — D509 Iron deficiency anemia, unspecified: Secondary | ICD-10-CM | POA: Diagnosis not present

## 2016-09-23 DIAGNOSIS — N2581 Secondary hyperparathyroidism of renal origin: Secondary | ICD-10-CM | POA: Diagnosis not present

## 2016-09-25 DIAGNOSIS — D631 Anemia in chronic kidney disease: Secondary | ICD-10-CM | POA: Diagnosis not present

## 2016-09-25 DIAGNOSIS — N186 End stage renal disease: Secondary | ICD-10-CM | POA: Diagnosis not present

## 2016-09-25 DIAGNOSIS — D509 Iron deficiency anemia, unspecified: Secondary | ICD-10-CM | POA: Diagnosis not present

## 2016-09-25 DIAGNOSIS — N2581 Secondary hyperparathyroidism of renal origin: Secondary | ICD-10-CM | POA: Diagnosis not present

## 2016-09-30 DIAGNOSIS — N186 End stage renal disease: Secondary | ICD-10-CM | POA: Diagnosis not present

## 2016-09-30 DIAGNOSIS — N2581 Secondary hyperparathyroidism of renal origin: Secondary | ICD-10-CM | POA: Diagnosis not present

## 2016-09-30 DIAGNOSIS — D631 Anemia in chronic kidney disease: Secondary | ICD-10-CM | POA: Diagnosis not present

## 2016-09-30 DIAGNOSIS — D509 Iron deficiency anemia, unspecified: Secondary | ICD-10-CM | POA: Diagnosis not present

## 2016-10-01 DIAGNOSIS — I129 Hypertensive chronic kidney disease with stage 1 through stage 4 chronic kidney disease, or unspecified chronic kidney disease: Secondary | ICD-10-CM | POA: Diagnosis not present

## 2016-10-02 DIAGNOSIS — D631 Anemia in chronic kidney disease: Secondary | ICD-10-CM | POA: Diagnosis not present

## 2016-10-02 DIAGNOSIS — N186 End stage renal disease: Secondary | ICD-10-CM | POA: Diagnosis not present

## 2016-10-02 DIAGNOSIS — N2581 Secondary hyperparathyroidism of renal origin: Secondary | ICD-10-CM | POA: Diagnosis not present

## 2016-10-05 DIAGNOSIS — N2581 Secondary hyperparathyroidism of renal origin: Secondary | ICD-10-CM | POA: Diagnosis not present

## 2016-10-05 DIAGNOSIS — D631 Anemia in chronic kidney disease: Secondary | ICD-10-CM | POA: Diagnosis not present

## 2016-10-05 DIAGNOSIS — N186 End stage renal disease: Secondary | ICD-10-CM | POA: Diagnosis not present

## 2016-10-07 DIAGNOSIS — D631 Anemia in chronic kidney disease: Secondary | ICD-10-CM | POA: Diagnosis not present

## 2016-10-07 DIAGNOSIS — N2581 Secondary hyperparathyroidism of renal origin: Secondary | ICD-10-CM | POA: Diagnosis not present

## 2016-10-07 DIAGNOSIS — N186 End stage renal disease: Secondary | ICD-10-CM | POA: Diagnosis not present

## 2016-10-09 DIAGNOSIS — N186 End stage renal disease: Secondary | ICD-10-CM | POA: Diagnosis not present

## 2016-10-09 DIAGNOSIS — N2581 Secondary hyperparathyroidism of renal origin: Secondary | ICD-10-CM | POA: Diagnosis not present

## 2016-10-09 DIAGNOSIS — D631 Anemia in chronic kidney disease: Secondary | ICD-10-CM | POA: Diagnosis not present

## 2016-10-12 DIAGNOSIS — D631 Anemia in chronic kidney disease: Secondary | ICD-10-CM | POA: Diagnosis not present

## 2016-10-12 DIAGNOSIS — N186 End stage renal disease: Secondary | ICD-10-CM | POA: Diagnosis not present

## 2016-10-12 DIAGNOSIS — N2581 Secondary hyperparathyroidism of renal origin: Secondary | ICD-10-CM | POA: Diagnosis not present

## 2016-10-14 DIAGNOSIS — D631 Anemia in chronic kidney disease: Secondary | ICD-10-CM | POA: Diagnosis not present

## 2016-10-14 DIAGNOSIS — N2581 Secondary hyperparathyroidism of renal origin: Secondary | ICD-10-CM | POA: Diagnosis not present

## 2016-10-14 DIAGNOSIS — N186 End stage renal disease: Secondary | ICD-10-CM | POA: Diagnosis not present

## 2016-10-16 DIAGNOSIS — D631 Anemia in chronic kidney disease: Secondary | ICD-10-CM | POA: Diagnosis not present

## 2016-10-16 DIAGNOSIS — N186 End stage renal disease: Secondary | ICD-10-CM | POA: Diagnosis not present

## 2016-10-16 DIAGNOSIS — N2581 Secondary hyperparathyroidism of renal origin: Secondary | ICD-10-CM | POA: Diagnosis not present

## 2016-10-19 DIAGNOSIS — N186 End stage renal disease: Secondary | ICD-10-CM | POA: Diagnosis not present

## 2016-10-19 DIAGNOSIS — D631 Anemia in chronic kidney disease: Secondary | ICD-10-CM | POA: Diagnosis not present

## 2016-10-19 DIAGNOSIS — N2581 Secondary hyperparathyroidism of renal origin: Secondary | ICD-10-CM | POA: Diagnosis not present

## 2016-10-21 DIAGNOSIS — N186 End stage renal disease: Secondary | ICD-10-CM | POA: Diagnosis not present

## 2016-10-21 DIAGNOSIS — N2581 Secondary hyperparathyroidism of renal origin: Secondary | ICD-10-CM | POA: Diagnosis not present

## 2016-10-21 DIAGNOSIS — D631 Anemia in chronic kidney disease: Secondary | ICD-10-CM | POA: Diagnosis not present

## 2016-10-23 DIAGNOSIS — N2581 Secondary hyperparathyroidism of renal origin: Secondary | ICD-10-CM | POA: Diagnosis not present

## 2016-10-23 DIAGNOSIS — N186 End stage renal disease: Secondary | ICD-10-CM | POA: Diagnosis not present

## 2016-10-23 DIAGNOSIS — D631 Anemia in chronic kidney disease: Secondary | ICD-10-CM | POA: Diagnosis not present

## 2016-10-26 DIAGNOSIS — D631 Anemia in chronic kidney disease: Secondary | ICD-10-CM | POA: Diagnosis not present

## 2016-10-26 DIAGNOSIS — N186 End stage renal disease: Secondary | ICD-10-CM | POA: Diagnosis not present

## 2016-10-26 DIAGNOSIS — N2581 Secondary hyperparathyroidism of renal origin: Secondary | ICD-10-CM | POA: Diagnosis not present

## 2016-10-28 DIAGNOSIS — D631 Anemia in chronic kidney disease: Secondary | ICD-10-CM | POA: Diagnosis not present

## 2016-10-28 DIAGNOSIS — N2581 Secondary hyperparathyroidism of renal origin: Secondary | ICD-10-CM | POA: Diagnosis not present

## 2016-10-28 DIAGNOSIS — N186 End stage renal disease: Secondary | ICD-10-CM | POA: Diagnosis not present

## 2016-10-30 DIAGNOSIS — N2581 Secondary hyperparathyroidism of renal origin: Secondary | ICD-10-CM | POA: Diagnosis not present

## 2016-10-30 DIAGNOSIS — D631 Anemia in chronic kidney disease: Secondary | ICD-10-CM | POA: Diagnosis not present

## 2016-10-30 DIAGNOSIS — N186 End stage renal disease: Secondary | ICD-10-CM | POA: Diagnosis not present

## 2016-10-31 DIAGNOSIS — Z992 Dependence on renal dialysis: Secondary | ICD-10-CM | POA: Diagnosis not present

## 2016-10-31 DIAGNOSIS — N186 End stage renal disease: Secondary | ICD-10-CM | POA: Diagnosis not present

## 2016-10-31 DIAGNOSIS — I129 Hypertensive chronic kidney disease with stage 1 through stage 4 chronic kidney disease, or unspecified chronic kidney disease: Secondary | ICD-10-CM | POA: Diagnosis not present

## 2016-11-02 DIAGNOSIS — N2581 Secondary hyperparathyroidism of renal origin: Secondary | ICD-10-CM | POA: Diagnosis not present

## 2016-11-02 DIAGNOSIS — D631 Anemia in chronic kidney disease: Secondary | ICD-10-CM | POA: Diagnosis not present

## 2016-11-02 DIAGNOSIS — N186 End stage renal disease: Secondary | ICD-10-CM | POA: Diagnosis not present

## 2016-11-02 DIAGNOSIS — D509 Iron deficiency anemia, unspecified: Secondary | ICD-10-CM | POA: Diagnosis not present

## 2016-11-04 DIAGNOSIS — N2581 Secondary hyperparathyroidism of renal origin: Secondary | ICD-10-CM | POA: Diagnosis not present

## 2016-11-04 DIAGNOSIS — N186 End stage renal disease: Secondary | ICD-10-CM | POA: Diagnosis not present

## 2016-11-04 DIAGNOSIS — D509 Iron deficiency anemia, unspecified: Secondary | ICD-10-CM | POA: Diagnosis not present

## 2016-11-04 DIAGNOSIS — D631 Anemia in chronic kidney disease: Secondary | ICD-10-CM | POA: Diagnosis not present

## 2016-11-06 DIAGNOSIS — D631 Anemia in chronic kidney disease: Secondary | ICD-10-CM | POA: Diagnosis not present

## 2016-11-06 DIAGNOSIS — N186 End stage renal disease: Secondary | ICD-10-CM | POA: Diagnosis not present

## 2016-11-06 DIAGNOSIS — N2581 Secondary hyperparathyroidism of renal origin: Secondary | ICD-10-CM | POA: Diagnosis not present

## 2016-11-06 DIAGNOSIS — D509 Iron deficiency anemia, unspecified: Secondary | ICD-10-CM | POA: Diagnosis not present

## 2016-11-09 DIAGNOSIS — N186 End stage renal disease: Secondary | ICD-10-CM | POA: Diagnosis not present

## 2016-11-09 DIAGNOSIS — D509 Iron deficiency anemia, unspecified: Secondary | ICD-10-CM | POA: Diagnosis not present

## 2016-11-09 DIAGNOSIS — N2581 Secondary hyperparathyroidism of renal origin: Secondary | ICD-10-CM | POA: Diagnosis not present

## 2016-11-09 DIAGNOSIS — D631 Anemia in chronic kidney disease: Secondary | ICD-10-CM | POA: Diagnosis not present

## 2016-11-11 DIAGNOSIS — N2581 Secondary hyperparathyroidism of renal origin: Secondary | ICD-10-CM | POA: Diagnosis not present

## 2016-11-11 DIAGNOSIS — N186 End stage renal disease: Secondary | ICD-10-CM | POA: Diagnosis not present

## 2016-11-11 DIAGNOSIS — D509 Iron deficiency anemia, unspecified: Secondary | ICD-10-CM | POA: Diagnosis not present

## 2016-11-11 DIAGNOSIS — D631 Anemia in chronic kidney disease: Secondary | ICD-10-CM | POA: Diagnosis not present

## 2016-11-13 DIAGNOSIS — D509 Iron deficiency anemia, unspecified: Secondary | ICD-10-CM | POA: Diagnosis not present

## 2016-11-13 DIAGNOSIS — D631 Anemia in chronic kidney disease: Secondary | ICD-10-CM | POA: Diagnosis not present

## 2016-11-13 DIAGNOSIS — N186 End stage renal disease: Secondary | ICD-10-CM | POA: Diagnosis not present

## 2016-11-13 DIAGNOSIS — N2581 Secondary hyperparathyroidism of renal origin: Secondary | ICD-10-CM | POA: Diagnosis not present

## 2016-11-16 DIAGNOSIS — D631 Anemia in chronic kidney disease: Secondary | ICD-10-CM | POA: Diagnosis not present

## 2016-11-16 DIAGNOSIS — N2581 Secondary hyperparathyroidism of renal origin: Secondary | ICD-10-CM | POA: Diagnosis not present

## 2016-11-16 DIAGNOSIS — N186 End stage renal disease: Secondary | ICD-10-CM | POA: Diagnosis not present

## 2016-11-16 DIAGNOSIS — D509 Iron deficiency anemia, unspecified: Secondary | ICD-10-CM | POA: Diagnosis not present

## 2016-11-18 DIAGNOSIS — D509 Iron deficiency anemia, unspecified: Secondary | ICD-10-CM | POA: Diagnosis not present

## 2016-11-18 DIAGNOSIS — D631 Anemia in chronic kidney disease: Secondary | ICD-10-CM | POA: Diagnosis not present

## 2016-11-18 DIAGNOSIS — N186 End stage renal disease: Secondary | ICD-10-CM | POA: Diagnosis not present

## 2016-11-18 DIAGNOSIS — N2581 Secondary hyperparathyroidism of renal origin: Secondary | ICD-10-CM | POA: Diagnosis not present

## 2016-11-20 DIAGNOSIS — N186 End stage renal disease: Secondary | ICD-10-CM | POA: Diagnosis not present

## 2016-11-20 DIAGNOSIS — D509 Iron deficiency anemia, unspecified: Secondary | ICD-10-CM | POA: Diagnosis not present

## 2016-11-20 DIAGNOSIS — N2581 Secondary hyperparathyroidism of renal origin: Secondary | ICD-10-CM | POA: Diagnosis not present

## 2016-11-20 DIAGNOSIS — D631 Anemia in chronic kidney disease: Secondary | ICD-10-CM | POA: Diagnosis not present

## 2016-11-23 DIAGNOSIS — D631 Anemia in chronic kidney disease: Secondary | ICD-10-CM | POA: Diagnosis not present

## 2016-11-23 DIAGNOSIS — N186 End stage renal disease: Secondary | ICD-10-CM | POA: Diagnosis not present

## 2016-11-23 DIAGNOSIS — N2581 Secondary hyperparathyroidism of renal origin: Secondary | ICD-10-CM | POA: Diagnosis not present

## 2016-11-23 DIAGNOSIS — D509 Iron deficiency anemia, unspecified: Secondary | ICD-10-CM | POA: Diagnosis not present

## 2016-11-24 ENCOUNTER — Encounter: Payer: Medicare Other | Admitting: Internal Medicine

## 2016-11-25 DIAGNOSIS — N2581 Secondary hyperparathyroidism of renal origin: Secondary | ICD-10-CM | POA: Diagnosis not present

## 2016-11-25 DIAGNOSIS — D509 Iron deficiency anemia, unspecified: Secondary | ICD-10-CM | POA: Diagnosis not present

## 2016-11-25 DIAGNOSIS — D631 Anemia in chronic kidney disease: Secondary | ICD-10-CM | POA: Diagnosis not present

## 2016-11-25 DIAGNOSIS — N186 End stage renal disease: Secondary | ICD-10-CM | POA: Diagnosis not present

## 2016-11-27 DIAGNOSIS — D631 Anemia in chronic kidney disease: Secondary | ICD-10-CM | POA: Diagnosis not present

## 2016-11-27 DIAGNOSIS — N186 End stage renal disease: Secondary | ICD-10-CM | POA: Diagnosis not present

## 2016-11-27 DIAGNOSIS — N2581 Secondary hyperparathyroidism of renal origin: Secondary | ICD-10-CM | POA: Diagnosis not present

## 2016-11-27 DIAGNOSIS — D509 Iron deficiency anemia, unspecified: Secondary | ICD-10-CM | POA: Diagnosis not present

## 2016-11-30 DIAGNOSIS — D509 Iron deficiency anemia, unspecified: Secondary | ICD-10-CM | POA: Diagnosis not present

## 2016-11-30 DIAGNOSIS — N2581 Secondary hyperparathyroidism of renal origin: Secondary | ICD-10-CM | POA: Diagnosis not present

## 2016-11-30 DIAGNOSIS — D631 Anemia in chronic kidney disease: Secondary | ICD-10-CM | POA: Diagnosis not present

## 2016-11-30 DIAGNOSIS — N186 End stage renal disease: Secondary | ICD-10-CM | POA: Diagnosis not present

## 2016-12-01 DIAGNOSIS — Z992 Dependence on renal dialysis: Secondary | ICD-10-CM | POA: Diagnosis not present

## 2016-12-01 DIAGNOSIS — N186 End stage renal disease: Secondary | ICD-10-CM | POA: Diagnosis not present

## 2016-12-01 DIAGNOSIS — I129 Hypertensive chronic kidney disease with stage 1 through stage 4 chronic kidney disease, or unspecified chronic kidney disease: Secondary | ICD-10-CM | POA: Diagnosis not present

## 2016-12-02 DIAGNOSIS — N186 End stage renal disease: Secondary | ICD-10-CM | POA: Diagnosis not present

## 2016-12-02 DIAGNOSIS — D631 Anemia in chronic kidney disease: Secondary | ICD-10-CM | POA: Diagnosis not present

## 2016-12-02 DIAGNOSIS — N2581 Secondary hyperparathyroidism of renal origin: Secondary | ICD-10-CM | POA: Diagnosis not present

## 2016-12-04 DIAGNOSIS — N2581 Secondary hyperparathyroidism of renal origin: Secondary | ICD-10-CM | POA: Diagnosis not present

## 2016-12-04 DIAGNOSIS — N186 End stage renal disease: Secondary | ICD-10-CM | POA: Diagnosis not present

## 2016-12-04 DIAGNOSIS — D631 Anemia in chronic kidney disease: Secondary | ICD-10-CM | POA: Diagnosis not present

## 2016-12-07 DIAGNOSIS — N186 End stage renal disease: Secondary | ICD-10-CM | POA: Diagnosis not present

## 2016-12-07 DIAGNOSIS — N2581 Secondary hyperparathyroidism of renal origin: Secondary | ICD-10-CM | POA: Diagnosis not present

## 2016-12-07 DIAGNOSIS — D631 Anemia in chronic kidney disease: Secondary | ICD-10-CM | POA: Diagnosis not present

## 2016-12-09 DIAGNOSIS — N2581 Secondary hyperparathyroidism of renal origin: Secondary | ICD-10-CM | POA: Diagnosis not present

## 2016-12-09 DIAGNOSIS — D631 Anemia in chronic kidney disease: Secondary | ICD-10-CM | POA: Diagnosis not present

## 2016-12-09 DIAGNOSIS — N186 End stage renal disease: Secondary | ICD-10-CM | POA: Diagnosis not present

## 2016-12-11 DIAGNOSIS — D631 Anemia in chronic kidney disease: Secondary | ICD-10-CM | POA: Diagnosis not present

## 2016-12-11 DIAGNOSIS — N2581 Secondary hyperparathyroidism of renal origin: Secondary | ICD-10-CM | POA: Diagnosis not present

## 2016-12-11 DIAGNOSIS — N186 End stage renal disease: Secondary | ICD-10-CM | POA: Diagnosis not present

## 2016-12-14 DIAGNOSIS — N2581 Secondary hyperparathyroidism of renal origin: Secondary | ICD-10-CM | POA: Diagnosis not present

## 2016-12-14 DIAGNOSIS — N186 End stage renal disease: Secondary | ICD-10-CM | POA: Diagnosis not present

## 2016-12-14 DIAGNOSIS — D631 Anemia in chronic kidney disease: Secondary | ICD-10-CM | POA: Diagnosis not present

## 2016-12-16 DIAGNOSIS — N186 End stage renal disease: Secondary | ICD-10-CM | POA: Diagnosis not present

## 2016-12-16 DIAGNOSIS — D631 Anemia in chronic kidney disease: Secondary | ICD-10-CM | POA: Diagnosis not present

## 2016-12-16 DIAGNOSIS — N2581 Secondary hyperparathyroidism of renal origin: Secondary | ICD-10-CM | POA: Diagnosis not present

## 2016-12-18 DIAGNOSIS — D631 Anemia in chronic kidney disease: Secondary | ICD-10-CM | POA: Diagnosis not present

## 2016-12-18 DIAGNOSIS — N2581 Secondary hyperparathyroidism of renal origin: Secondary | ICD-10-CM | POA: Diagnosis not present

## 2016-12-18 DIAGNOSIS — N186 End stage renal disease: Secondary | ICD-10-CM | POA: Diagnosis not present

## 2016-12-21 DIAGNOSIS — N186 End stage renal disease: Secondary | ICD-10-CM | POA: Diagnosis not present

## 2016-12-21 DIAGNOSIS — D631 Anemia in chronic kidney disease: Secondary | ICD-10-CM | POA: Diagnosis not present

## 2016-12-21 DIAGNOSIS — N2581 Secondary hyperparathyroidism of renal origin: Secondary | ICD-10-CM | POA: Diagnosis not present

## 2016-12-22 ENCOUNTER — Encounter: Payer: Self-pay | Admitting: Internal Medicine

## 2016-12-23 DIAGNOSIS — D631 Anemia in chronic kidney disease: Secondary | ICD-10-CM | POA: Diagnosis not present

## 2016-12-23 DIAGNOSIS — N2581 Secondary hyperparathyroidism of renal origin: Secondary | ICD-10-CM | POA: Diagnosis not present

## 2016-12-23 DIAGNOSIS — N186 End stage renal disease: Secondary | ICD-10-CM | POA: Diagnosis not present

## 2016-12-25 DIAGNOSIS — N186 End stage renal disease: Secondary | ICD-10-CM | POA: Diagnosis not present

## 2016-12-25 DIAGNOSIS — D631 Anemia in chronic kidney disease: Secondary | ICD-10-CM | POA: Diagnosis not present

## 2016-12-25 DIAGNOSIS — N2581 Secondary hyperparathyroidism of renal origin: Secondary | ICD-10-CM | POA: Diagnosis not present

## 2016-12-28 DIAGNOSIS — N2581 Secondary hyperparathyroidism of renal origin: Secondary | ICD-10-CM | POA: Diagnosis not present

## 2016-12-28 DIAGNOSIS — D631 Anemia in chronic kidney disease: Secondary | ICD-10-CM | POA: Diagnosis not present

## 2016-12-28 DIAGNOSIS — N186 End stage renal disease: Secondary | ICD-10-CM | POA: Diagnosis not present

## 2017-01-01 DIAGNOSIS — I129 Hypertensive chronic kidney disease with stage 1 through stage 4 chronic kidney disease, or unspecified chronic kidney disease: Secondary | ICD-10-CM | POA: Diagnosis not present

## 2017-01-01 DIAGNOSIS — N186 End stage renal disease: Secondary | ICD-10-CM | POA: Diagnosis not present

## 2017-01-01 DIAGNOSIS — Z992 Dependence on renal dialysis: Secondary | ICD-10-CM | POA: Diagnosis not present

## 2017-01-01 DIAGNOSIS — N2581 Secondary hyperparathyroidism of renal origin: Secondary | ICD-10-CM | POA: Diagnosis not present

## 2017-01-01 DIAGNOSIS — D631 Anemia in chronic kidney disease: Secondary | ICD-10-CM | POA: Diagnosis not present

## 2017-01-04 DIAGNOSIS — N186 End stage renal disease: Secondary | ICD-10-CM | POA: Diagnosis not present

## 2017-01-04 DIAGNOSIS — N2581 Secondary hyperparathyroidism of renal origin: Secondary | ICD-10-CM | POA: Diagnosis not present

## 2017-01-04 DIAGNOSIS — D631 Anemia in chronic kidney disease: Secondary | ICD-10-CM | POA: Diagnosis not present

## 2017-01-04 DIAGNOSIS — D509 Iron deficiency anemia, unspecified: Secondary | ICD-10-CM | POA: Diagnosis not present

## 2017-01-06 DIAGNOSIS — D631 Anemia in chronic kidney disease: Secondary | ICD-10-CM | POA: Diagnosis not present

## 2017-01-06 DIAGNOSIS — D509 Iron deficiency anemia, unspecified: Secondary | ICD-10-CM | POA: Diagnosis not present

## 2017-01-06 DIAGNOSIS — N2581 Secondary hyperparathyroidism of renal origin: Secondary | ICD-10-CM | POA: Diagnosis not present

## 2017-01-06 DIAGNOSIS — N186 End stage renal disease: Secondary | ICD-10-CM | POA: Diagnosis not present

## 2017-01-08 DIAGNOSIS — N2581 Secondary hyperparathyroidism of renal origin: Secondary | ICD-10-CM | POA: Diagnosis not present

## 2017-01-08 DIAGNOSIS — D509 Iron deficiency anemia, unspecified: Secondary | ICD-10-CM | POA: Diagnosis not present

## 2017-01-08 DIAGNOSIS — D631 Anemia in chronic kidney disease: Secondary | ICD-10-CM | POA: Diagnosis not present

## 2017-01-08 DIAGNOSIS — N186 End stage renal disease: Secondary | ICD-10-CM | POA: Diagnosis not present

## 2017-01-11 DIAGNOSIS — D509 Iron deficiency anemia, unspecified: Secondary | ICD-10-CM | POA: Diagnosis not present

## 2017-01-11 DIAGNOSIS — N186 End stage renal disease: Secondary | ICD-10-CM | POA: Diagnosis not present

## 2017-01-11 DIAGNOSIS — N2581 Secondary hyperparathyroidism of renal origin: Secondary | ICD-10-CM | POA: Diagnosis not present

## 2017-01-11 DIAGNOSIS — D631 Anemia in chronic kidney disease: Secondary | ICD-10-CM | POA: Diagnosis not present

## 2017-01-13 DIAGNOSIS — D509 Iron deficiency anemia, unspecified: Secondary | ICD-10-CM | POA: Diagnosis not present

## 2017-01-13 DIAGNOSIS — N2581 Secondary hyperparathyroidism of renal origin: Secondary | ICD-10-CM | POA: Diagnosis not present

## 2017-01-13 DIAGNOSIS — D631 Anemia in chronic kidney disease: Secondary | ICD-10-CM | POA: Diagnosis not present

## 2017-01-13 DIAGNOSIS — N186 End stage renal disease: Secondary | ICD-10-CM | POA: Diagnosis not present

## 2017-01-15 DIAGNOSIS — D631 Anemia in chronic kidney disease: Secondary | ICD-10-CM | POA: Diagnosis not present

## 2017-01-15 DIAGNOSIS — D509 Iron deficiency anemia, unspecified: Secondary | ICD-10-CM | POA: Diagnosis not present

## 2017-01-15 DIAGNOSIS — N2581 Secondary hyperparathyroidism of renal origin: Secondary | ICD-10-CM | POA: Diagnosis not present

## 2017-01-15 DIAGNOSIS — N186 End stage renal disease: Secondary | ICD-10-CM | POA: Diagnosis not present

## 2017-01-18 DIAGNOSIS — N186 End stage renal disease: Secondary | ICD-10-CM | POA: Diagnosis not present

## 2017-01-18 DIAGNOSIS — D631 Anemia in chronic kidney disease: Secondary | ICD-10-CM | POA: Diagnosis not present

## 2017-01-18 DIAGNOSIS — D509 Iron deficiency anemia, unspecified: Secondary | ICD-10-CM | POA: Diagnosis not present

## 2017-01-18 DIAGNOSIS — N2581 Secondary hyperparathyroidism of renal origin: Secondary | ICD-10-CM | POA: Diagnosis not present

## 2017-01-19 ENCOUNTER — Encounter: Payer: Medicare Other | Admitting: Internal Medicine

## 2017-01-20 DIAGNOSIS — D509 Iron deficiency anemia, unspecified: Secondary | ICD-10-CM | POA: Diagnosis not present

## 2017-01-20 DIAGNOSIS — N186 End stage renal disease: Secondary | ICD-10-CM | POA: Diagnosis not present

## 2017-01-20 DIAGNOSIS — D631 Anemia in chronic kidney disease: Secondary | ICD-10-CM | POA: Diagnosis not present

## 2017-01-20 DIAGNOSIS — N2581 Secondary hyperparathyroidism of renal origin: Secondary | ICD-10-CM | POA: Diagnosis not present

## 2017-01-22 DIAGNOSIS — D631 Anemia in chronic kidney disease: Secondary | ICD-10-CM | POA: Diagnosis not present

## 2017-01-22 DIAGNOSIS — N186 End stage renal disease: Secondary | ICD-10-CM | POA: Diagnosis not present

## 2017-01-22 DIAGNOSIS — D509 Iron deficiency anemia, unspecified: Secondary | ICD-10-CM | POA: Diagnosis not present

## 2017-01-22 DIAGNOSIS — N2581 Secondary hyperparathyroidism of renal origin: Secondary | ICD-10-CM | POA: Diagnosis not present

## 2017-01-25 DIAGNOSIS — N2581 Secondary hyperparathyroidism of renal origin: Secondary | ICD-10-CM | POA: Diagnosis not present

## 2017-01-25 DIAGNOSIS — D631 Anemia in chronic kidney disease: Secondary | ICD-10-CM | POA: Diagnosis not present

## 2017-01-25 DIAGNOSIS — N186 End stage renal disease: Secondary | ICD-10-CM | POA: Diagnosis not present

## 2017-01-25 DIAGNOSIS — D509 Iron deficiency anemia, unspecified: Secondary | ICD-10-CM | POA: Diagnosis not present

## 2017-01-27 DIAGNOSIS — D509 Iron deficiency anemia, unspecified: Secondary | ICD-10-CM | POA: Diagnosis not present

## 2017-01-27 DIAGNOSIS — D631 Anemia in chronic kidney disease: Secondary | ICD-10-CM | POA: Diagnosis not present

## 2017-01-27 DIAGNOSIS — N186 End stage renal disease: Secondary | ICD-10-CM | POA: Diagnosis not present

## 2017-01-27 DIAGNOSIS — N2581 Secondary hyperparathyroidism of renal origin: Secondary | ICD-10-CM | POA: Diagnosis not present

## 2017-01-29 DIAGNOSIS — N186 End stage renal disease: Secondary | ICD-10-CM | POA: Diagnosis not present

## 2017-01-29 DIAGNOSIS — D509 Iron deficiency anemia, unspecified: Secondary | ICD-10-CM | POA: Diagnosis not present

## 2017-01-29 DIAGNOSIS — N2581 Secondary hyperparathyroidism of renal origin: Secondary | ICD-10-CM | POA: Diagnosis not present

## 2017-01-29 DIAGNOSIS — D631 Anemia in chronic kidney disease: Secondary | ICD-10-CM | POA: Diagnosis not present

## 2017-01-31 DIAGNOSIS — I129 Hypertensive chronic kidney disease with stage 1 through stage 4 chronic kidney disease, or unspecified chronic kidney disease: Secondary | ICD-10-CM | POA: Diagnosis not present

## 2017-01-31 DIAGNOSIS — Z992 Dependence on renal dialysis: Secondary | ICD-10-CM | POA: Diagnosis not present

## 2017-01-31 DIAGNOSIS — N186 End stage renal disease: Secondary | ICD-10-CM | POA: Diagnosis not present

## 2017-02-01 DIAGNOSIS — D509 Iron deficiency anemia, unspecified: Secondary | ICD-10-CM | POA: Diagnosis not present

## 2017-02-01 DIAGNOSIS — N2581 Secondary hyperparathyroidism of renal origin: Secondary | ICD-10-CM | POA: Diagnosis not present

## 2017-02-01 DIAGNOSIS — D631 Anemia in chronic kidney disease: Secondary | ICD-10-CM | POA: Diagnosis not present

## 2017-02-01 DIAGNOSIS — N186 End stage renal disease: Secondary | ICD-10-CM | POA: Diagnosis not present

## 2017-02-03 DIAGNOSIS — D631 Anemia in chronic kidney disease: Secondary | ICD-10-CM | POA: Diagnosis not present

## 2017-02-03 DIAGNOSIS — D509 Iron deficiency anemia, unspecified: Secondary | ICD-10-CM | POA: Diagnosis not present

## 2017-02-03 DIAGNOSIS — N2581 Secondary hyperparathyroidism of renal origin: Secondary | ICD-10-CM | POA: Diagnosis not present

## 2017-02-03 DIAGNOSIS — N186 End stage renal disease: Secondary | ICD-10-CM | POA: Diagnosis not present

## 2017-02-05 DIAGNOSIS — D509 Iron deficiency anemia, unspecified: Secondary | ICD-10-CM | POA: Diagnosis not present

## 2017-02-05 DIAGNOSIS — N2581 Secondary hyperparathyroidism of renal origin: Secondary | ICD-10-CM | POA: Diagnosis not present

## 2017-02-05 DIAGNOSIS — D631 Anemia in chronic kidney disease: Secondary | ICD-10-CM | POA: Diagnosis not present

## 2017-02-05 DIAGNOSIS — N186 End stage renal disease: Secondary | ICD-10-CM | POA: Diagnosis not present

## 2017-02-08 DIAGNOSIS — D631 Anemia in chronic kidney disease: Secondary | ICD-10-CM | POA: Diagnosis not present

## 2017-02-08 DIAGNOSIS — N186 End stage renal disease: Secondary | ICD-10-CM | POA: Diagnosis not present

## 2017-02-08 DIAGNOSIS — D509 Iron deficiency anemia, unspecified: Secondary | ICD-10-CM | POA: Diagnosis not present

## 2017-02-08 DIAGNOSIS — N2581 Secondary hyperparathyroidism of renal origin: Secondary | ICD-10-CM | POA: Diagnosis not present

## 2017-02-10 DIAGNOSIS — N2581 Secondary hyperparathyroidism of renal origin: Secondary | ICD-10-CM | POA: Diagnosis not present

## 2017-02-10 DIAGNOSIS — D631 Anemia in chronic kidney disease: Secondary | ICD-10-CM | POA: Diagnosis not present

## 2017-02-10 DIAGNOSIS — N186 End stage renal disease: Secondary | ICD-10-CM | POA: Diagnosis not present

## 2017-02-10 DIAGNOSIS — D509 Iron deficiency anemia, unspecified: Secondary | ICD-10-CM | POA: Diagnosis not present

## 2017-02-11 ENCOUNTER — Encounter: Payer: Self-pay | Admitting: Neurology

## 2017-02-11 ENCOUNTER — Ambulatory Visit (INDEPENDENT_AMBULATORY_CARE_PROVIDER_SITE_OTHER): Payer: Medicare Other | Admitting: Neurology

## 2017-02-11 DIAGNOSIS — G5711 Meralgia paresthetica, right lower limb: Secondary | ICD-10-CM | POA: Diagnosis not present

## 2017-02-11 HISTORY — DX: Meralgia paresthetica, right lower limb: G57.11

## 2017-02-11 MED ORDER — CARBAMAZEPINE 200 MG PO TABS: 200.0000 mg | ORAL_TABLET | Freq: Two times a day (BID) | ORAL | 3 refills | Status: AC

## 2017-02-11 NOTE — Patient Instructions (Signed)
   We will start carbamazepine 200 mg tablets, take 1/2 tablet twice a day for 2 weeks, then take one full tablet twice a day.  Tegretol (carbamazepine) may result in dizziness, gait instability, cognitive slowing, or drowsiness. Sometimes, and allergic rash may occur, or a photosensitive rash may occur. If any significant side effects are noted, please contact our office.

## 2017-02-11 NOTE — Progress Notes (Signed)
Reason for visit: Right thigh numbness and pain  Referring physician: Dr. Malachi Bonds is a 46 y.o. male  History of present illness:  Mr. Nastasi is a 46 year old right-handed black male with a history of severe hypertension and subsequent end-stage renal disease on hemodialysis. The patient has reported onset of right thigh numbness and discomfort over the last 8 months, correlating with when he went on hemodialysis. The patient has numbness in the distal anterolateral portion of the right thigh, but he has tingling sensations and sharp shooting pains that occur that are uncomfortable for him. He has been on gabapentin taking 300 mg at night without full benefit. The patient denies weakness of the extremities, he reports no numbness in the feet or pain in the feet. He has no back pain or neck pain or pain down the arms or legs. He has not had any balance changes or problems controlling the bowels or the bladder. He is sent to this office for further evaluation. He has no problems with the left leg whatsoever.  Past Medical History:  Diagnosis Date  . CHF (congestive heart failure) ([place])   . Hypertension   . Renal disorder     Past Surgical History:  Procedure Laterality Date  . AV FISTULA PLACEMENT Left 04/07/2016   Procedure: Left  arm Brachiocephalic ARTERIOVENOUS (AV) FISTULA CREATION;  Surgeon: Conrad Spillville, MD;  Location: [facility];  Service: Vascular;  Laterality: Left;  . INSERTION OF DIALYSIS CATHETER Right 04/07/2016   Procedure: INSERTION OF DIALYSIS CATHETER;  Surgeon: Conrad [place], MD;  Location: [facility];  Service: Vascular;  Laterality: Right;  . NO PAST SURGERIES      Family History  Problem Relation Age of Onset  . Kidney failure Mother        was on dialysis    Social history:  reports that he has quit smoking. His smoking use included Cigarettes. He has a 0.50 pack-year smoking history. He has never used smokeless tobacco. He reports that he does not drink  alcohol or use drugs.  Medications:  Prior to Admission medications   Medication Sig Start Date End Date Taking? Authorizing Provider  calcitRIOL (ROCALTROL) 0.5 MCG capsule Take 2 capsules (1 mcg total) by mouth every other day. 04/07/16  Yes Holley Raring, MD  calcium acetate (PHOSLO) 667 MG capsule Take 3 capsules (2,001 mg total) by mouth 3 (three) times daily with meals. 04/07/16  Yes Holley Raring, MD  carvedilol (COREG) 25 MG tablet Take 1 tablet (25 mg total) by mouth 2 (two) times daily with a meal. 04/07/16  Yes Holley Raring, MD  cinacalcet (SENSIPAR) 30 MG tablet Take 2 tablets (60 mg total) by mouth daily with supper. 04/07/16  Yes Holley Raring, MD  gabapentin (NEURONTIN) 300 MG capsule Take 1 capsule (300 mg total) by mouth at bedtime. 07/28/16 07/28/17 Yes Shela Leff, MD  hydrALAZINE (APRESOLINE) 100 MG tablet Take 1 tablet (100 mg total) by mouth 3 (three) times daily. 07/28/16  Yes Shela Leff, MD  ibuprofen (ADVIL,MOTRIN) 200 MG tablet Take 200 mg by mouth every 6 (six) hours as needed.   Yes [provider]  Menthol, Topical Analgesic, (BENGAY EX) Apply topically.   Yes [provider]      Allergies  Allergen Reactions  . No Known Allergies     ROS:  Out of a complete 14 system review of symptoms, the patient complains only of the following symptoms, and all other reviewed systems are  negative.  Numbness  Blood pressure (!) 158/90, pulse 77, height 5\' 2"  (1.575 m), weight 183 lb (83 kg), SpO2 96 %.  Physical Exam  General: The patient is alert and cooperative at the time of the examination.  Eyes: Pupils are equal, round, and reactive to light. Discs are flat bilaterally.  Neck: The neck is supple, no carotid bruits are noted.  Respiratory: The respiratory examination is clear.  Cardiovascular: The cardiovascular examination reveals a regular rate and rhythm, no obvious murmurs or rubs are noted.  Skin: Extremities are  without significant edema.  Neurologic Exam  Mental status: The patient is alert and oriented x 3 at the time of the examination. The patient has apparent normal recent and remote memory, with an apparently normal attention span and concentration ability.  Cranial nerves: Facial symmetry is present. There is good sensation of the face to pinprick and soft touch bilaterally. The strength of the facial muscles and the muscles to head turning and shoulder shrug are normal bilaterally. Speech is well enunciated, no aphasia or dysarthria is noted. Extraocular movements are full. Visual fields are full. The tongue is midline, and the patient has symmetric elevation of the soft palate. No obvious hearing deficits are noted.  Motor: The motor testing reveals 5 over 5 strength of all 4 extremities. Good symmetric motor tone is noted throughout.  Sensory: Sensory testing is intact to pinprick, soft touch, vibration sensation, and position sense on all 4 extremities, with exception of some decreased pinprick sensation on the anterolateral right thigh from the midpoint to the knee. No evidence of extinction is noted.  Coordination: Cerebellar testing reveals good finger-nose-finger and heel-to-shin bilaterally.  Gait and station: Gait is normal. Tandem gait is normal. Romberg is negative. No drift is seen.  Reflexes: Deep tendon reflexes are symmetric and normal bilaterally, with exception that the left ankle jerk reflex is slightly brisk. The knee jerk reflexes are well-maintained bilaterally. Toes are downgoing bilaterally.   Assessment/Plan:  1. Right meralgia paresthetica  The patient has a right lateral femoral cutaneous neuropathy in the distal portions of this nerve distribution. The etiology is not clear, may be related to the long-term history of hypertension. The patient is on gabapentin without full pain control, he will be placed on low-dose carbamazepine, gradually working up on the dose. He  will follow-up in 3 months, we will check blood work at that time. He will call for any dose adjustments.  Jill Alexanders MD 02/11/2017 8:47 AM  [facility] [address] [place], [place] [address]  Phone [phone] Fax [phone]

## 2017-02-12 DIAGNOSIS — D509 Iron deficiency anemia, unspecified: Secondary | ICD-10-CM | POA: Diagnosis not present

## 2017-02-12 DIAGNOSIS — N2581 Secondary hyperparathyroidism of renal origin: Secondary | ICD-10-CM | POA: Diagnosis not present

## 2017-02-12 DIAGNOSIS — D631 Anemia in chronic kidney disease: Secondary | ICD-10-CM | POA: Diagnosis not present

## 2017-02-12 DIAGNOSIS — N186 End stage renal disease: Secondary | ICD-10-CM | POA: Diagnosis not present

## 2017-02-15 DIAGNOSIS — N2581 Secondary hyperparathyroidism of renal origin: Secondary | ICD-10-CM | POA: Diagnosis not present

## 2017-02-15 DIAGNOSIS — D631 Anemia in chronic kidney disease: Secondary | ICD-10-CM | POA: Diagnosis not present

## 2017-02-15 DIAGNOSIS — N186 End stage renal disease: Secondary | ICD-10-CM | POA: Diagnosis not present

## 2017-02-15 DIAGNOSIS — D509 Iron deficiency anemia, unspecified: Secondary | ICD-10-CM | POA: Diagnosis not present

## 2017-02-17 DIAGNOSIS — N2581 Secondary hyperparathyroidism of renal origin: Secondary | ICD-10-CM | POA: Diagnosis not present

## 2017-02-17 DIAGNOSIS — D509 Iron deficiency anemia, unspecified: Secondary | ICD-10-CM | POA: Diagnosis not present

## 2017-02-17 DIAGNOSIS — D631 Anemia in chronic kidney disease: Secondary | ICD-10-CM | POA: Diagnosis not present

## 2017-02-17 DIAGNOSIS — N186 End stage renal disease: Secondary | ICD-10-CM | POA: Diagnosis not present

## 2017-02-19 DIAGNOSIS — D631 Anemia in chronic kidney disease: Secondary | ICD-10-CM | POA: Diagnosis not present

## 2017-02-19 DIAGNOSIS — D509 Iron deficiency anemia, unspecified: Secondary | ICD-10-CM | POA: Diagnosis not present

## 2017-02-19 DIAGNOSIS — N2581 Secondary hyperparathyroidism of renal origin: Secondary | ICD-10-CM | POA: Diagnosis not present

## 2017-02-19 DIAGNOSIS — N186 End stage renal disease: Secondary | ICD-10-CM | POA: Diagnosis not present

## 2017-02-22 DIAGNOSIS — D631 Anemia in chronic kidney disease: Secondary | ICD-10-CM | POA: Diagnosis not present

## 2017-02-22 DIAGNOSIS — N186 End stage renal disease: Secondary | ICD-10-CM | POA: Diagnosis not present

## 2017-02-22 DIAGNOSIS — D509 Iron deficiency anemia, unspecified: Secondary | ICD-10-CM | POA: Diagnosis not present

## 2017-02-22 DIAGNOSIS — N2581 Secondary hyperparathyroidism of renal origin: Secondary | ICD-10-CM | POA: Diagnosis not present

## 2017-02-24 DIAGNOSIS — N186 End stage renal disease: Secondary | ICD-10-CM | POA: Diagnosis not present

## 2017-02-24 DIAGNOSIS — D631 Anemia in chronic kidney disease: Secondary | ICD-10-CM | POA: Diagnosis not present

## 2017-02-24 DIAGNOSIS — D509 Iron deficiency anemia, unspecified: Secondary | ICD-10-CM | POA: Diagnosis not present

## 2017-02-24 DIAGNOSIS — N2581 Secondary hyperparathyroidism of renal origin: Secondary | ICD-10-CM | POA: Diagnosis not present

## 2017-02-26 DIAGNOSIS — D631 Anemia in chronic kidney disease: Secondary | ICD-10-CM | POA: Diagnosis not present

## 2017-02-26 DIAGNOSIS — D509 Iron deficiency anemia, unspecified: Secondary | ICD-10-CM | POA: Diagnosis not present

## 2017-02-26 DIAGNOSIS — N2581 Secondary hyperparathyroidism of renal origin: Secondary | ICD-10-CM | POA: Diagnosis not present

## 2017-02-26 DIAGNOSIS — N186 End stage renal disease: Secondary | ICD-10-CM | POA: Diagnosis not present

## 2017-03-01 DIAGNOSIS — D631 Anemia in chronic kidney disease: Secondary | ICD-10-CM | POA: Diagnosis not present

## 2017-03-01 DIAGNOSIS — D509 Iron deficiency anemia, unspecified: Secondary | ICD-10-CM | POA: Diagnosis not present

## 2017-03-01 DIAGNOSIS — N2581 Secondary hyperparathyroidism of renal origin: Secondary | ICD-10-CM | POA: Diagnosis not present

## 2017-03-01 DIAGNOSIS — N186 End stage renal disease: Secondary | ICD-10-CM | POA: Diagnosis not present

## 2017-03-03 DIAGNOSIS — N186 End stage renal disease: Secondary | ICD-10-CM | POA: Diagnosis not present

## 2017-03-03 DIAGNOSIS — I129 Hypertensive chronic kidney disease with stage 1 through stage 4 chronic kidney disease, or unspecified chronic kidney disease: Secondary | ICD-10-CM | POA: Diagnosis not present

## 2017-03-03 DIAGNOSIS — D509 Iron deficiency anemia, unspecified: Secondary | ICD-10-CM | POA: Diagnosis not present

## 2017-03-03 DIAGNOSIS — Z992 Dependence on renal dialysis: Secondary | ICD-10-CM | POA: Diagnosis not present

## 2017-03-03 DIAGNOSIS — N2581 Secondary hyperparathyroidism of renal origin: Secondary | ICD-10-CM | POA: Diagnosis not present

## 2017-03-03 DIAGNOSIS — D631 Anemia in chronic kidney disease: Secondary | ICD-10-CM | POA: Diagnosis not present

## 2017-03-05 DIAGNOSIS — N186 End stage renal disease: Secondary | ICD-10-CM | POA: Diagnosis not present

## 2017-03-05 DIAGNOSIS — N2581 Secondary hyperparathyroidism of renal origin: Secondary | ICD-10-CM | POA: Diagnosis not present

## 2017-03-05 DIAGNOSIS — D631 Anemia in chronic kidney disease: Secondary | ICD-10-CM | POA: Diagnosis not present

## 2017-03-08 DIAGNOSIS — N2581 Secondary hyperparathyroidism of renal origin: Secondary | ICD-10-CM | POA: Diagnosis not present

## 2017-03-08 DIAGNOSIS — D631 Anemia in chronic kidney disease: Secondary | ICD-10-CM | POA: Diagnosis not present

## 2017-03-08 DIAGNOSIS — N186 End stage renal disease: Secondary | ICD-10-CM | POA: Diagnosis not present

## 2017-03-10 DIAGNOSIS — N2581 Secondary hyperparathyroidism of renal origin: Secondary | ICD-10-CM | POA: Diagnosis not present

## 2017-03-10 DIAGNOSIS — D631 Anemia in chronic kidney disease: Secondary | ICD-10-CM | POA: Diagnosis not present

## 2017-03-10 DIAGNOSIS — N186 End stage renal disease: Secondary | ICD-10-CM | POA: Diagnosis not present

## 2017-03-12 DIAGNOSIS — N2581 Secondary hyperparathyroidism of renal origin: Secondary | ICD-10-CM | POA: Diagnosis not present

## 2017-03-12 DIAGNOSIS — N186 End stage renal disease: Secondary | ICD-10-CM | POA: Diagnosis not present

## 2017-03-12 DIAGNOSIS — D631 Anemia in chronic kidney disease: Secondary | ICD-10-CM | POA: Diagnosis not present

## 2017-03-15 DIAGNOSIS — N2581 Secondary hyperparathyroidism of renal origin: Secondary | ICD-10-CM | POA: Diagnosis not present

## 2017-03-15 DIAGNOSIS — D631 Anemia in chronic kidney disease: Secondary | ICD-10-CM | POA: Diagnosis not present

## 2017-03-15 DIAGNOSIS — N186 End stage renal disease: Secondary | ICD-10-CM | POA: Diagnosis not present

## 2017-03-17 DIAGNOSIS — N186 End stage renal disease: Secondary | ICD-10-CM | POA: Diagnosis not present

## 2017-03-17 DIAGNOSIS — D631 Anemia in chronic kidney disease: Secondary | ICD-10-CM | POA: Diagnosis not present

## 2017-03-17 DIAGNOSIS — N2581 Secondary hyperparathyroidism of renal origin: Secondary | ICD-10-CM | POA: Diagnosis not present

## 2017-03-19 DIAGNOSIS — N186 End stage renal disease: Secondary | ICD-10-CM | POA: Diagnosis not present

## 2017-03-19 DIAGNOSIS — D631 Anemia in chronic kidney disease: Secondary | ICD-10-CM | POA: Diagnosis not present

## 2017-03-19 DIAGNOSIS — N2581 Secondary hyperparathyroidism of renal origin: Secondary | ICD-10-CM | POA: Diagnosis not present

## 2017-03-21 DIAGNOSIS — N186 End stage renal disease: Secondary | ICD-10-CM | POA: Diagnosis not present

## 2017-03-21 DIAGNOSIS — N2581 Secondary hyperparathyroidism of renal origin: Secondary | ICD-10-CM | POA: Diagnosis not present

## 2017-03-21 DIAGNOSIS — D631 Anemia in chronic kidney disease: Secondary | ICD-10-CM | POA: Diagnosis not present

## 2017-03-23 DIAGNOSIS — D631 Anemia in chronic kidney disease: Secondary | ICD-10-CM | POA: Diagnosis not present

## 2017-03-23 DIAGNOSIS — N2581 Secondary hyperparathyroidism of renal origin: Secondary | ICD-10-CM | POA: Diagnosis not present

## 2017-03-23 DIAGNOSIS — N186 End stage renal disease: Secondary | ICD-10-CM | POA: Diagnosis not present

## 2017-03-26 DIAGNOSIS — N186 End stage renal disease: Secondary | ICD-10-CM | POA: Diagnosis not present

## 2017-03-26 DIAGNOSIS — N2581 Secondary hyperparathyroidism of renal origin: Secondary | ICD-10-CM | POA: Diagnosis not present

## 2017-03-26 DIAGNOSIS — D631 Anemia in chronic kidney disease: Secondary | ICD-10-CM | POA: Diagnosis not present

## 2017-03-29 DIAGNOSIS — N186 End stage renal disease: Secondary | ICD-10-CM | POA: Diagnosis not present

## 2017-03-29 DIAGNOSIS — D631 Anemia in chronic kidney disease: Secondary | ICD-10-CM | POA: Diagnosis not present

## 2017-03-29 DIAGNOSIS — N2581 Secondary hyperparathyroidism of renal origin: Secondary | ICD-10-CM | POA: Diagnosis not present

## 2017-03-31 DIAGNOSIS — N2581 Secondary hyperparathyroidism of renal origin: Secondary | ICD-10-CM | POA: Diagnosis not present

## 2017-03-31 DIAGNOSIS — D631 Anemia in chronic kidney disease: Secondary | ICD-10-CM | POA: Diagnosis not present

## 2017-03-31 DIAGNOSIS — N186 End stage renal disease: Secondary | ICD-10-CM | POA: Diagnosis not present

## 2017-04-02 DIAGNOSIS — D631 Anemia in chronic kidney disease: Secondary | ICD-10-CM | POA: Diagnosis not present

## 2017-04-02 DIAGNOSIS — I129 Hypertensive chronic kidney disease with stage 1 through stage 4 chronic kidney disease, or unspecified chronic kidney disease: Secondary | ICD-10-CM | POA: Diagnosis not present

## 2017-04-02 DIAGNOSIS — N2581 Secondary hyperparathyroidism of renal origin: Secondary | ICD-10-CM | POA: Diagnosis not present

## 2017-04-02 DIAGNOSIS — Z992 Dependence on renal dialysis: Secondary | ICD-10-CM | POA: Diagnosis not present

## 2017-04-02 DIAGNOSIS — N186 End stage renal disease: Secondary | ICD-10-CM | POA: Diagnosis not present

## 2017-04-05 DIAGNOSIS — D631 Anemia in chronic kidney disease: Secondary | ICD-10-CM | POA: Diagnosis not present

## 2017-04-05 DIAGNOSIS — N186 End stage renal disease: Secondary | ICD-10-CM | POA: Diagnosis not present

## 2017-04-05 DIAGNOSIS — N2581 Secondary hyperparathyroidism of renal origin: Secondary | ICD-10-CM | POA: Diagnosis not present

## 2017-04-07 DIAGNOSIS — N186 End stage renal disease: Secondary | ICD-10-CM | POA: Diagnosis not present

## 2017-04-07 DIAGNOSIS — N2581 Secondary hyperparathyroidism of renal origin: Secondary | ICD-10-CM | POA: Diagnosis not present

## 2017-04-07 DIAGNOSIS — D631 Anemia in chronic kidney disease: Secondary | ICD-10-CM | POA: Diagnosis not present

## 2017-04-09 DIAGNOSIS — D631 Anemia in chronic kidney disease: Secondary | ICD-10-CM | POA: Diagnosis not present

## 2017-04-09 DIAGNOSIS — N186 End stage renal disease: Secondary | ICD-10-CM | POA: Diagnosis not present

## 2017-04-09 DIAGNOSIS — N2581 Secondary hyperparathyroidism of renal origin: Secondary | ICD-10-CM | POA: Diagnosis not present

## 2017-04-12 DIAGNOSIS — N2581 Secondary hyperparathyroidism of renal origin: Secondary | ICD-10-CM | POA: Diagnosis not present

## 2017-04-12 DIAGNOSIS — N186 End stage renal disease: Secondary | ICD-10-CM | POA: Diagnosis not present

## 2017-04-12 DIAGNOSIS — D631 Anemia in chronic kidney disease: Secondary | ICD-10-CM | POA: Diagnosis not present

## 2017-04-14 DIAGNOSIS — D631 Anemia in chronic kidney disease: Secondary | ICD-10-CM | POA: Diagnosis not present

## 2017-04-14 DIAGNOSIS — N2581 Secondary hyperparathyroidism of renal origin: Secondary | ICD-10-CM | POA: Diagnosis not present

## 2017-04-14 DIAGNOSIS — N186 End stage renal disease: Secondary | ICD-10-CM | POA: Diagnosis not present

## 2017-04-16 DIAGNOSIS — N186 End stage renal disease: Secondary | ICD-10-CM | POA: Diagnosis not present

## 2017-04-16 DIAGNOSIS — N2581 Secondary hyperparathyroidism of renal origin: Secondary | ICD-10-CM | POA: Diagnosis not present

## 2017-04-16 DIAGNOSIS — D631 Anemia in chronic kidney disease: Secondary | ICD-10-CM | POA: Diagnosis not present

## 2017-04-19 DIAGNOSIS — N2581 Secondary hyperparathyroidism of renal origin: Secondary | ICD-10-CM | POA: Diagnosis not present

## 2017-04-19 DIAGNOSIS — N186 End stage renal disease: Secondary | ICD-10-CM | POA: Diagnosis not present

## 2017-04-19 DIAGNOSIS — D631 Anemia in chronic kidney disease: Secondary | ICD-10-CM | POA: Diagnosis not present

## 2017-04-20 ENCOUNTER — Encounter: Payer: Medicare Other | Admitting: Internal Medicine

## 2017-04-21 DIAGNOSIS — N186 End stage renal disease: Secondary | ICD-10-CM | POA: Diagnosis not present

## 2017-04-21 DIAGNOSIS — N2581 Secondary hyperparathyroidism of renal origin: Secondary | ICD-10-CM | POA: Diagnosis not present

## 2017-04-21 DIAGNOSIS — D631 Anemia in chronic kidney disease: Secondary | ICD-10-CM | POA: Diagnosis not present

## 2017-04-23 DIAGNOSIS — N186 End stage renal disease: Secondary | ICD-10-CM | POA: Diagnosis not present

## 2017-04-23 DIAGNOSIS — D631 Anemia in chronic kidney disease: Secondary | ICD-10-CM | POA: Diagnosis not present

## 2017-04-23 DIAGNOSIS — N2581 Secondary hyperparathyroidism of renal origin: Secondary | ICD-10-CM | POA: Diagnosis not present

## 2017-04-25 DIAGNOSIS — N2581 Secondary hyperparathyroidism of renal origin: Secondary | ICD-10-CM | POA: Diagnosis not present

## 2017-04-25 DIAGNOSIS — D631 Anemia in chronic kidney disease: Secondary | ICD-10-CM | POA: Diagnosis not present

## 2017-04-25 DIAGNOSIS — N186 End stage renal disease: Secondary | ICD-10-CM | POA: Diagnosis not present

## 2017-04-28 DIAGNOSIS — D631 Anemia in chronic kidney disease: Secondary | ICD-10-CM | POA: Diagnosis not present

## 2017-04-28 DIAGNOSIS — N2581 Secondary hyperparathyroidism of renal origin: Secondary | ICD-10-CM | POA: Diagnosis not present

## 2017-04-28 DIAGNOSIS — N186 End stage renal disease: Secondary | ICD-10-CM | POA: Diagnosis not present

## 2017-04-30 DIAGNOSIS — D631 Anemia in chronic kidney disease: Secondary | ICD-10-CM | POA: Diagnosis not present

## 2017-04-30 DIAGNOSIS — N2581 Secondary hyperparathyroidism of renal origin: Secondary | ICD-10-CM | POA: Diagnosis not present

## 2017-04-30 DIAGNOSIS — N186 End stage renal disease: Secondary | ICD-10-CM | POA: Diagnosis not present

## 2017-05-02 DIAGNOSIS — N186 End stage renal disease: Secondary | ICD-10-CM | POA: Diagnosis not present

## 2017-05-02 DIAGNOSIS — N2581 Secondary hyperparathyroidism of renal origin: Secondary | ICD-10-CM | POA: Diagnosis not present

## 2017-05-02 DIAGNOSIS — D631 Anemia in chronic kidney disease: Secondary | ICD-10-CM | POA: Diagnosis not present

## 2017-05-03 DIAGNOSIS — N186 End stage renal disease: Secondary | ICD-10-CM | POA: Diagnosis not present

## 2017-05-03 DIAGNOSIS — I129 Hypertensive chronic kidney disease with stage 1 through stage 4 chronic kidney disease, or unspecified chronic kidney disease: Secondary | ICD-10-CM | POA: Diagnosis not present

## 2017-05-03 DIAGNOSIS — Z992 Dependence on renal dialysis: Secondary | ICD-10-CM | POA: Diagnosis not present

## 2017-05-05 DIAGNOSIS — N186 End stage renal disease: Secondary | ICD-10-CM | POA: Diagnosis not present

## 2017-05-05 DIAGNOSIS — D631 Anemia in chronic kidney disease: Secondary | ICD-10-CM | POA: Diagnosis not present

## 2017-05-05 DIAGNOSIS — N2581 Secondary hyperparathyroidism of renal origin: Secondary | ICD-10-CM | POA: Diagnosis not present

## 2017-05-05 DIAGNOSIS — D509 Iron deficiency anemia, unspecified: Secondary | ICD-10-CM | POA: Diagnosis not present

## 2017-05-07 DIAGNOSIS — D509 Iron deficiency anemia, unspecified: Secondary | ICD-10-CM | POA: Diagnosis not present

## 2017-05-07 DIAGNOSIS — N2581 Secondary hyperparathyroidism of renal origin: Secondary | ICD-10-CM | POA: Diagnosis not present

## 2017-05-07 DIAGNOSIS — N186 End stage renal disease: Secondary | ICD-10-CM | POA: Diagnosis not present

## 2017-05-07 DIAGNOSIS — D631 Anemia in chronic kidney disease: Secondary | ICD-10-CM | POA: Diagnosis not present

## 2017-05-10 DIAGNOSIS — D509 Iron deficiency anemia, unspecified: Secondary | ICD-10-CM | POA: Diagnosis not present

## 2017-05-10 DIAGNOSIS — D631 Anemia in chronic kidney disease: Secondary | ICD-10-CM | POA: Diagnosis not present

## 2017-05-10 DIAGNOSIS — N186 End stage renal disease: Secondary | ICD-10-CM | POA: Diagnosis not present

## 2017-05-10 DIAGNOSIS — N2581 Secondary hyperparathyroidism of renal origin: Secondary | ICD-10-CM | POA: Diagnosis not present

## 2017-05-12 DIAGNOSIS — N2581 Secondary hyperparathyroidism of renal origin: Secondary | ICD-10-CM | POA: Diagnosis not present

## 2017-05-12 DIAGNOSIS — N186 End stage renal disease: Secondary | ICD-10-CM | POA: Diagnosis not present

## 2017-05-12 DIAGNOSIS — D631 Anemia in chronic kidney disease: Secondary | ICD-10-CM | POA: Diagnosis not present

## 2017-05-12 DIAGNOSIS — D509 Iron deficiency anemia, unspecified: Secondary | ICD-10-CM | POA: Diagnosis not present

## 2017-05-14 DIAGNOSIS — D509 Iron deficiency anemia, unspecified: Secondary | ICD-10-CM | POA: Diagnosis not present

## 2017-05-14 DIAGNOSIS — N186 End stage renal disease: Secondary | ICD-10-CM | POA: Diagnosis not present

## 2017-05-14 DIAGNOSIS — D631 Anemia in chronic kidney disease: Secondary | ICD-10-CM | POA: Diagnosis not present

## 2017-05-14 DIAGNOSIS — N2581 Secondary hyperparathyroidism of renal origin: Secondary | ICD-10-CM | POA: Diagnosis not present

## 2017-05-17 ENCOUNTER — Ambulatory Visit: Payer: Medicare Other | Admitting: Nurse Practitioner

## 2017-05-17 DIAGNOSIS — N2581 Secondary hyperparathyroidism of renal origin: Secondary | ICD-10-CM | POA: Diagnosis not present

## 2017-05-17 DIAGNOSIS — D631 Anemia in chronic kidney disease: Secondary | ICD-10-CM | POA: Diagnosis not present

## 2017-05-17 DIAGNOSIS — D509 Iron deficiency anemia, unspecified: Secondary | ICD-10-CM | POA: Diagnosis not present

## 2017-05-17 DIAGNOSIS — N186 End stage renal disease: Secondary | ICD-10-CM | POA: Diagnosis not present

## 2017-05-19 DIAGNOSIS — D631 Anemia in chronic kidney disease: Secondary | ICD-10-CM | POA: Diagnosis not present

## 2017-05-19 DIAGNOSIS — D509 Iron deficiency anemia, unspecified: Secondary | ICD-10-CM | POA: Diagnosis not present

## 2017-05-19 DIAGNOSIS — N186 End stage renal disease: Secondary | ICD-10-CM | POA: Diagnosis not present

## 2017-05-19 DIAGNOSIS — N2581 Secondary hyperparathyroidism of renal origin: Secondary | ICD-10-CM | POA: Diagnosis not present

## 2017-05-21 DIAGNOSIS — D509 Iron deficiency anemia, unspecified: Secondary | ICD-10-CM | POA: Diagnosis not present

## 2017-05-21 DIAGNOSIS — D631 Anemia in chronic kidney disease: Secondary | ICD-10-CM | POA: Diagnosis not present

## 2017-05-21 DIAGNOSIS — N2581 Secondary hyperparathyroidism of renal origin: Secondary | ICD-10-CM | POA: Diagnosis not present

## 2017-05-21 DIAGNOSIS — N186 End stage renal disease: Secondary | ICD-10-CM | POA: Diagnosis not present

## 2017-05-24 DIAGNOSIS — D509 Iron deficiency anemia, unspecified: Secondary | ICD-10-CM | POA: Diagnosis not present

## 2017-05-24 DIAGNOSIS — D631 Anemia in chronic kidney disease: Secondary | ICD-10-CM | POA: Diagnosis not present

## 2017-05-24 DIAGNOSIS — N186 End stage renal disease: Secondary | ICD-10-CM | POA: Diagnosis not present

## 2017-05-24 DIAGNOSIS — N2581 Secondary hyperparathyroidism of renal origin: Secondary | ICD-10-CM | POA: Diagnosis not present

## 2017-05-26 DIAGNOSIS — D631 Anemia in chronic kidney disease: Secondary | ICD-10-CM | POA: Diagnosis not present

## 2017-05-26 DIAGNOSIS — D509 Iron deficiency anemia, unspecified: Secondary | ICD-10-CM | POA: Diagnosis not present

## 2017-05-26 DIAGNOSIS — N186 End stage renal disease: Secondary | ICD-10-CM | POA: Diagnosis not present

## 2017-05-26 DIAGNOSIS — N2581 Secondary hyperparathyroidism of renal origin: Secondary | ICD-10-CM | POA: Diagnosis not present

## 2017-05-28 DIAGNOSIS — N2581 Secondary hyperparathyroidism of renal origin: Secondary | ICD-10-CM | POA: Diagnosis not present

## 2017-05-28 DIAGNOSIS — D509 Iron deficiency anemia, unspecified: Secondary | ICD-10-CM | POA: Diagnosis not present

## 2017-05-28 DIAGNOSIS — D631 Anemia in chronic kidney disease: Secondary | ICD-10-CM | POA: Diagnosis not present

## 2017-05-28 DIAGNOSIS — N186 End stage renal disease: Secondary | ICD-10-CM | POA: Diagnosis not present

## 2017-05-31 DIAGNOSIS — N186 End stage renal disease: Secondary | ICD-10-CM | POA: Diagnosis not present

## 2017-05-31 DIAGNOSIS — D631 Anemia in chronic kidney disease: Secondary | ICD-10-CM | POA: Diagnosis not present

## 2017-05-31 DIAGNOSIS — D509 Iron deficiency anemia, unspecified: Secondary | ICD-10-CM | POA: Diagnosis not present

## 2017-05-31 DIAGNOSIS — N2581 Secondary hyperparathyroidism of renal origin: Secondary | ICD-10-CM | POA: Diagnosis not present

## 2017-06-02 ENCOUNTER — Encounter: Payer: Self-pay | Admitting: Internal Medicine

## 2017-06-02 DIAGNOSIS — N186 End stage renal disease: Secondary | ICD-10-CM | POA: Diagnosis not present

## 2017-06-02 DIAGNOSIS — D631 Anemia in chronic kidney disease: Secondary | ICD-10-CM | POA: Diagnosis not present

## 2017-06-02 DIAGNOSIS — N2581 Secondary hyperparathyroidism of renal origin: Secondary | ICD-10-CM | POA: Diagnosis not present

## 2017-06-02 DIAGNOSIS — D509 Iron deficiency anemia, unspecified: Secondary | ICD-10-CM | POA: Diagnosis not present

## 2017-06-03 DIAGNOSIS — I129 Hypertensive chronic kidney disease with stage 1 through stage 4 chronic kidney disease, or unspecified chronic kidney disease: Secondary | ICD-10-CM | POA: Diagnosis not present

## 2017-06-03 DIAGNOSIS — N186 End stage renal disease: Secondary | ICD-10-CM | POA: Diagnosis not present

## 2017-06-03 DIAGNOSIS — Z992 Dependence on renal dialysis: Secondary | ICD-10-CM | POA: Diagnosis not present

## 2017-06-04 DIAGNOSIS — D631 Anemia in chronic kidney disease: Secondary | ICD-10-CM | POA: Diagnosis not present

## 2017-06-04 DIAGNOSIS — I129 Hypertensive chronic kidney disease with stage 1 through stage 4 chronic kidney disease, or unspecified chronic kidney disease: Secondary | ICD-10-CM | POA: Diagnosis not present

## 2017-06-04 DIAGNOSIS — D509 Iron deficiency anemia, unspecified: Secondary | ICD-10-CM | POA: Diagnosis not present

## 2017-06-04 DIAGNOSIS — Z992 Dependence on renal dialysis: Secondary | ICD-10-CM | POA: Diagnosis not present

## 2017-06-04 DIAGNOSIS — N186 End stage renal disease: Secondary | ICD-10-CM | POA: Diagnosis not present

## 2017-06-04 DIAGNOSIS — N2581 Secondary hyperparathyroidism of renal origin: Secondary | ICD-10-CM | POA: Diagnosis not present

## 2017-06-07 DIAGNOSIS — D509 Iron deficiency anemia, unspecified: Secondary | ICD-10-CM | POA: Diagnosis not present

## 2017-06-07 DIAGNOSIS — N2581 Secondary hyperparathyroidism of renal origin: Secondary | ICD-10-CM | POA: Diagnosis not present

## 2017-06-07 DIAGNOSIS — N186 End stage renal disease: Secondary | ICD-10-CM | POA: Diagnosis not present

## 2017-06-07 DIAGNOSIS — D631 Anemia in chronic kidney disease: Secondary | ICD-10-CM | POA: Diagnosis not present

## 2017-06-09 DIAGNOSIS — N186 End stage renal disease: Secondary | ICD-10-CM | POA: Diagnosis not present

## 2017-06-09 DIAGNOSIS — D631 Anemia in chronic kidney disease: Secondary | ICD-10-CM | POA: Diagnosis not present

## 2017-06-09 DIAGNOSIS — D509 Iron deficiency anemia, unspecified: Secondary | ICD-10-CM | POA: Diagnosis not present

## 2017-06-09 DIAGNOSIS — N2581 Secondary hyperparathyroidism of renal origin: Secondary | ICD-10-CM | POA: Diagnosis not present

## 2017-06-11 DIAGNOSIS — N2581 Secondary hyperparathyroidism of renal origin: Secondary | ICD-10-CM | POA: Diagnosis not present

## 2017-06-11 DIAGNOSIS — D509 Iron deficiency anemia, unspecified: Secondary | ICD-10-CM | POA: Diagnosis not present

## 2017-06-11 DIAGNOSIS — N186 End stage renal disease: Secondary | ICD-10-CM | POA: Diagnosis not present

## 2017-06-11 DIAGNOSIS — D631 Anemia in chronic kidney disease: Secondary | ICD-10-CM | POA: Diagnosis not present

## 2017-06-14 DIAGNOSIS — D509 Iron deficiency anemia, unspecified: Secondary | ICD-10-CM | POA: Diagnosis not present

## 2017-06-14 DIAGNOSIS — N2581 Secondary hyperparathyroidism of renal origin: Secondary | ICD-10-CM | POA: Diagnosis not present

## 2017-06-14 DIAGNOSIS — D631 Anemia in chronic kidney disease: Secondary | ICD-10-CM | POA: Diagnosis not present

## 2017-06-14 DIAGNOSIS — N186 End stage renal disease: Secondary | ICD-10-CM | POA: Diagnosis not present

## 2017-06-16 DIAGNOSIS — N2581 Secondary hyperparathyroidism of renal origin: Secondary | ICD-10-CM | POA: Diagnosis not present

## 2017-06-16 DIAGNOSIS — N186 End stage renal disease: Secondary | ICD-10-CM | POA: Diagnosis not present

## 2017-06-16 DIAGNOSIS — D509 Iron deficiency anemia, unspecified: Secondary | ICD-10-CM | POA: Diagnosis not present

## 2017-06-16 DIAGNOSIS — D631 Anemia in chronic kidney disease: Secondary | ICD-10-CM | POA: Diagnosis not present

## 2017-06-18 DIAGNOSIS — N2581 Secondary hyperparathyroidism of renal origin: Secondary | ICD-10-CM | POA: Diagnosis not present

## 2017-06-18 DIAGNOSIS — D509 Iron deficiency anemia, unspecified: Secondary | ICD-10-CM | POA: Diagnosis not present

## 2017-06-18 DIAGNOSIS — N186 End stage renal disease: Secondary | ICD-10-CM | POA: Diagnosis not present

## 2017-06-18 DIAGNOSIS — D631 Anemia in chronic kidney disease: Secondary | ICD-10-CM | POA: Diagnosis not present

## 2017-06-21 DIAGNOSIS — N186 End stage renal disease: Secondary | ICD-10-CM | POA: Diagnosis not present

## 2017-06-21 DIAGNOSIS — D631 Anemia in chronic kidney disease: Secondary | ICD-10-CM | POA: Diagnosis not present

## 2017-06-21 DIAGNOSIS — N2581 Secondary hyperparathyroidism of renal origin: Secondary | ICD-10-CM | POA: Diagnosis not present

## 2017-06-21 DIAGNOSIS — D509 Iron deficiency anemia, unspecified: Secondary | ICD-10-CM | POA: Diagnosis not present

## 2017-06-23 DIAGNOSIS — N186 End stage renal disease: Secondary | ICD-10-CM | POA: Diagnosis not present

## 2017-06-23 DIAGNOSIS — D509 Iron deficiency anemia, unspecified: Secondary | ICD-10-CM | POA: Diagnosis not present

## 2017-06-23 DIAGNOSIS — D631 Anemia in chronic kidney disease: Secondary | ICD-10-CM | POA: Diagnosis not present

## 2017-06-23 DIAGNOSIS — N2581 Secondary hyperparathyroidism of renal origin: Secondary | ICD-10-CM | POA: Diagnosis not present

## 2017-06-25 DIAGNOSIS — D509 Iron deficiency anemia, unspecified: Secondary | ICD-10-CM | POA: Diagnosis not present

## 2017-06-25 DIAGNOSIS — N2581 Secondary hyperparathyroidism of renal origin: Secondary | ICD-10-CM | POA: Diagnosis not present

## 2017-06-25 DIAGNOSIS — N186 End stage renal disease: Secondary | ICD-10-CM | POA: Diagnosis not present

## 2017-06-25 DIAGNOSIS — D631 Anemia in chronic kidney disease: Secondary | ICD-10-CM | POA: Diagnosis not present

## 2017-06-28 DIAGNOSIS — N2581 Secondary hyperparathyroidism of renal origin: Secondary | ICD-10-CM | POA: Diagnosis not present

## 2017-06-28 DIAGNOSIS — D631 Anemia in chronic kidney disease: Secondary | ICD-10-CM | POA: Diagnosis not present

## 2017-06-28 DIAGNOSIS — N186 End stage renal disease: Secondary | ICD-10-CM | POA: Diagnosis not present

## 2017-06-28 DIAGNOSIS — D509 Iron deficiency anemia, unspecified: Secondary | ICD-10-CM | POA: Diagnosis not present

## 2017-06-30 DIAGNOSIS — N186 End stage renal disease: Secondary | ICD-10-CM | POA: Diagnosis not present

## 2017-06-30 DIAGNOSIS — N2581 Secondary hyperparathyroidism of renal origin: Secondary | ICD-10-CM | POA: Diagnosis not present

## 2017-06-30 DIAGNOSIS — D509 Iron deficiency anemia, unspecified: Secondary | ICD-10-CM | POA: Diagnosis not present

## 2017-06-30 DIAGNOSIS — D631 Anemia in chronic kidney disease: Secondary | ICD-10-CM | POA: Diagnosis not present

## 2017-07-02 DIAGNOSIS — I129 Hypertensive chronic kidney disease with stage 1 through stage 4 chronic kidney disease, or unspecified chronic kidney disease: Secondary | ICD-10-CM | POA: Diagnosis not present

## 2017-07-02 DIAGNOSIS — N2581 Secondary hyperparathyroidism of renal origin: Secondary | ICD-10-CM | POA: Diagnosis not present

## 2017-07-02 DIAGNOSIS — N186 End stage renal disease: Secondary | ICD-10-CM | POA: Diagnosis not present

## 2017-07-02 DIAGNOSIS — Z992 Dependence on renal dialysis: Secondary | ICD-10-CM | POA: Diagnosis not present

## 2017-07-02 DIAGNOSIS — D631 Anemia in chronic kidney disease: Secondary | ICD-10-CM | POA: Diagnosis not present

## 2017-07-05 DIAGNOSIS — D631 Anemia in chronic kidney disease: Secondary | ICD-10-CM | POA: Diagnosis not present

## 2017-07-05 DIAGNOSIS — N2581 Secondary hyperparathyroidism of renal origin: Secondary | ICD-10-CM | POA: Diagnosis not present

## 2017-07-05 DIAGNOSIS — N186 End stage renal disease: Secondary | ICD-10-CM | POA: Diagnosis not present

## 2017-07-07 DIAGNOSIS — N186 End stage renal disease: Secondary | ICD-10-CM | POA: Diagnosis not present

## 2017-07-07 DIAGNOSIS — D631 Anemia in chronic kidney disease: Secondary | ICD-10-CM | POA: Diagnosis not present

## 2017-07-07 DIAGNOSIS — N2581 Secondary hyperparathyroidism of renal origin: Secondary | ICD-10-CM | POA: Diagnosis not present

## 2017-07-09 DIAGNOSIS — N186 End stage renal disease: Secondary | ICD-10-CM | POA: Diagnosis not present

## 2017-07-09 DIAGNOSIS — D631 Anemia in chronic kidney disease: Secondary | ICD-10-CM | POA: Diagnosis not present

## 2017-07-09 DIAGNOSIS — N2581 Secondary hyperparathyroidism of renal origin: Secondary | ICD-10-CM | POA: Diagnosis not present

## 2017-07-12 DIAGNOSIS — N2581 Secondary hyperparathyroidism of renal origin: Secondary | ICD-10-CM | POA: Diagnosis not present

## 2017-07-12 DIAGNOSIS — N186 End stage renal disease: Secondary | ICD-10-CM | POA: Diagnosis not present

## 2017-07-12 DIAGNOSIS — D631 Anemia in chronic kidney disease: Secondary | ICD-10-CM | POA: Diagnosis not present

## 2017-07-14 DIAGNOSIS — N186 End stage renal disease: Secondary | ICD-10-CM | POA: Diagnosis not present

## 2017-07-14 DIAGNOSIS — N2581 Secondary hyperparathyroidism of renal origin: Secondary | ICD-10-CM | POA: Diagnosis not present

## 2017-07-14 DIAGNOSIS — D631 Anemia in chronic kidney disease: Secondary | ICD-10-CM | POA: Diagnosis not present

## 2017-07-16 DIAGNOSIS — N2581 Secondary hyperparathyroidism of renal origin: Secondary | ICD-10-CM | POA: Diagnosis not present

## 2017-07-16 DIAGNOSIS — D631 Anemia in chronic kidney disease: Secondary | ICD-10-CM | POA: Diagnosis not present

## 2017-07-16 DIAGNOSIS — N186 End stage renal disease: Secondary | ICD-10-CM | POA: Diagnosis not present

## 2017-07-19 DIAGNOSIS — N186 End stage renal disease: Secondary | ICD-10-CM | POA: Diagnosis not present

## 2017-07-19 DIAGNOSIS — D631 Anemia in chronic kidney disease: Secondary | ICD-10-CM | POA: Diagnosis not present

## 2017-07-19 DIAGNOSIS — N2581 Secondary hyperparathyroidism of renal origin: Secondary | ICD-10-CM | POA: Diagnosis not present

## 2017-07-23 DIAGNOSIS — D631 Anemia in chronic kidney disease: Secondary | ICD-10-CM | POA: Diagnosis not present

## 2017-07-23 DIAGNOSIS — N2581 Secondary hyperparathyroidism of renal origin: Secondary | ICD-10-CM | POA: Diagnosis not present

## 2017-07-23 DIAGNOSIS — N186 End stage renal disease: Secondary | ICD-10-CM | POA: Diagnosis not present

## 2017-07-26 DIAGNOSIS — N186 End stage renal disease: Secondary | ICD-10-CM | POA: Diagnosis not present

## 2017-07-26 DIAGNOSIS — D631 Anemia in chronic kidney disease: Secondary | ICD-10-CM | POA: Diagnosis not present

## 2017-07-26 DIAGNOSIS — N2581 Secondary hyperparathyroidism of renal origin: Secondary | ICD-10-CM | POA: Diagnosis not present

## 2017-07-28 DIAGNOSIS — D631 Anemia in chronic kidney disease: Secondary | ICD-10-CM | POA: Diagnosis not present

## 2017-07-28 DIAGNOSIS — N2581 Secondary hyperparathyroidism of renal origin: Secondary | ICD-10-CM | POA: Diagnosis not present

## 2017-07-28 DIAGNOSIS — N186 End stage renal disease: Secondary | ICD-10-CM | POA: Diagnosis not present

## 2017-07-30 DIAGNOSIS — N2581 Secondary hyperparathyroidism of renal origin: Secondary | ICD-10-CM | POA: Diagnosis not present

## 2017-07-30 DIAGNOSIS — D631 Anemia in chronic kidney disease: Secondary | ICD-10-CM | POA: Diagnosis not present

## 2017-07-30 DIAGNOSIS — N186 End stage renal disease: Secondary | ICD-10-CM | POA: Diagnosis not present

## 2017-08-02 DIAGNOSIS — I129 Hypertensive chronic kidney disease with stage 1 through stage 4 chronic kidney disease, or unspecified chronic kidney disease: Secondary | ICD-10-CM | POA: Diagnosis not present

## 2017-08-02 DIAGNOSIS — N186 End stage renal disease: Secondary | ICD-10-CM | POA: Diagnosis not present

## 2017-08-02 DIAGNOSIS — D631 Anemia in chronic kidney disease: Secondary | ICD-10-CM | POA: Diagnosis not present

## 2017-08-02 DIAGNOSIS — N2581 Secondary hyperparathyroidism of renal origin: Secondary | ICD-10-CM | POA: Diagnosis not present

## 2017-08-02 DIAGNOSIS — Z992 Dependence on renal dialysis: Secondary | ICD-10-CM | POA: Diagnosis not present

## 2017-08-04 DIAGNOSIS — N2581 Secondary hyperparathyroidism of renal origin: Secondary | ICD-10-CM | POA: Diagnosis not present

## 2017-08-04 DIAGNOSIS — N186 End stage renal disease: Secondary | ICD-10-CM | POA: Diagnosis not present

## 2017-08-04 DIAGNOSIS — D631 Anemia in chronic kidney disease: Secondary | ICD-10-CM | POA: Diagnosis not present

## 2017-08-06 DIAGNOSIS — N2581 Secondary hyperparathyroidism of renal origin: Secondary | ICD-10-CM | POA: Diagnosis not present

## 2017-08-06 DIAGNOSIS — N186 End stage renal disease: Secondary | ICD-10-CM | POA: Diagnosis not present

## 2017-08-06 DIAGNOSIS — D631 Anemia in chronic kidney disease: Secondary | ICD-10-CM | POA: Diagnosis not present

## 2017-08-09 DIAGNOSIS — N2581 Secondary hyperparathyroidism of renal origin: Secondary | ICD-10-CM | POA: Diagnosis not present

## 2017-08-09 DIAGNOSIS — N186 End stage renal disease: Secondary | ICD-10-CM | POA: Diagnosis not present

## 2017-08-09 DIAGNOSIS — D631 Anemia in chronic kidney disease: Secondary | ICD-10-CM | POA: Diagnosis not present

## 2017-08-11 DIAGNOSIS — N2581 Secondary hyperparathyroidism of renal origin: Secondary | ICD-10-CM | POA: Diagnosis not present

## 2017-08-11 DIAGNOSIS — D631 Anemia in chronic kidney disease: Secondary | ICD-10-CM | POA: Diagnosis not present

## 2017-08-11 DIAGNOSIS — N186 End stage renal disease: Secondary | ICD-10-CM | POA: Diagnosis not present

## 2017-08-12 DIAGNOSIS — N186 End stage renal disease: Secondary | ICD-10-CM | POA: Diagnosis not present

## 2017-08-12 DIAGNOSIS — E8770 Fluid overload, unspecified: Secondary | ICD-10-CM | POA: Diagnosis not present

## 2017-08-12 DIAGNOSIS — N2581 Secondary hyperparathyroidism of renal origin: Secondary | ICD-10-CM | POA: Diagnosis not present

## 2017-08-13 DIAGNOSIS — D631 Anemia in chronic kidney disease: Secondary | ICD-10-CM | POA: Diagnosis not present

## 2017-08-13 DIAGNOSIS — N186 End stage renal disease: Secondary | ICD-10-CM | POA: Diagnosis not present

## 2017-08-13 DIAGNOSIS — N2581 Secondary hyperparathyroidism of renal origin: Secondary | ICD-10-CM | POA: Diagnosis not present

## 2017-08-16 DIAGNOSIS — N2581 Secondary hyperparathyroidism of renal origin: Secondary | ICD-10-CM | POA: Diagnosis not present

## 2017-08-16 DIAGNOSIS — D631 Anemia in chronic kidney disease: Secondary | ICD-10-CM | POA: Diagnosis not present

## 2017-08-16 DIAGNOSIS — N186 End stage renal disease: Secondary | ICD-10-CM | POA: Diagnosis not present

## 2017-08-18 DIAGNOSIS — D631 Anemia in chronic kidney disease: Secondary | ICD-10-CM | POA: Diagnosis not present

## 2017-08-18 DIAGNOSIS — N186 End stage renal disease: Secondary | ICD-10-CM | POA: Diagnosis not present

## 2017-08-18 DIAGNOSIS — N2581 Secondary hyperparathyroidism of renal origin: Secondary | ICD-10-CM | POA: Diagnosis not present

## 2017-08-20 DIAGNOSIS — N186 End stage renal disease: Secondary | ICD-10-CM | POA: Diagnosis not present

## 2017-08-20 DIAGNOSIS — N2581 Secondary hyperparathyroidism of renal origin: Secondary | ICD-10-CM | POA: Diagnosis not present

## 2017-08-20 DIAGNOSIS — D631 Anemia in chronic kidney disease: Secondary | ICD-10-CM | POA: Diagnosis not present

## 2017-08-23 DIAGNOSIS — D631 Anemia in chronic kidney disease: Secondary | ICD-10-CM | POA: Diagnosis not present

## 2017-08-23 DIAGNOSIS — N2581 Secondary hyperparathyroidism of renal origin: Secondary | ICD-10-CM | POA: Diagnosis not present

## 2017-08-23 DIAGNOSIS — N186 End stage renal disease: Secondary | ICD-10-CM | POA: Diagnosis not present

## 2017-08-25 DIAGNOSIS — N2581 Secondary hyperparathyroidism of renal origin: Secondary | ICD-10-CM | POA: Diagnosis not present

## 2017-08-25 DIAGNOSIS — N186 End stage renal disease: Secondary | ICD-10-CM | POA: Diagnosis not present

## 2017-08-25 DIAGNOSIS — D631 Anemia in chronic kidney disease: Secondary | ICD-10-CM | POA: Diagnosis not present

## 2017-08-27 DIAGNOSIS — N2581 Secondary hyperparathyroidism of renal origin: Secondary | ICD-10-CM | POA: Diagnosis not present

## 2017-08-27 DIAGNOSIS — N186 End stage renal disease: Secondary | ICD-10-CM | POA: Diagnosis not present

## 2017-08-27 DIAGNOSIS — D631 Anemia in chronic kidney disease: Secondary | ICD-10-CM | POA: Diagnosis not present

## 2017-08-30 DIAGNOSIS — N186 End stage renal disease: Secondary | ICD-10-CM | POA: Diagnosis not present

## 2017-08-30 DIAGNOSIS — D631 Anemia in chronic kidney disease: Secondary | ICD-10-CM | POA: Diagnosis not present

## 2017-08-30 DIAGNOSIS — N2581 Secondary hyperparathyroidism of renal origin: Secondary | ICD-10-CM | POA: Diagnosis not present

## 2017-09-01 DIAGNOSIS — N2581 Secondary hyperparathyroidism of renal origin: Secondary | ICD-10-CM | POA: Diagnosis not present

## 2017-09-01 DIAGNOSIS — Z992 Dependence on renal dialysis: Secondary | ICD-10-CM | POA: Diagnosis not present

## 2017-09-01 DIAGNOSIS — D631 Anemia in chronic kidney disease: Secondary | ICD-10-CM | POA: Diagnosis not present

## 2017-09-01 DIAGNOSIS — N186 End stage renal disease: Secondary | ICD-10-CM | POA: Diagnosis not present

## 2017-09-01 DIAGNOSIS — I129 Hypertensive chronic kidney disease with stage 1 through stage 4 chronic kidney disease, or unspecified chronic kidney disease: Secondary | ICD-10-CM | POA: Diagnosis not present

## 2017-09-03 DIAGNOSIS — N2581 Secondary hyperparathyroidism of renal origin: Secondary | ICD-10-CM | POA: Diagnosis not present

## 2017-09-03 DIAGNOSIS — N186 End stage renal disease: Secondary | ICD-10-CM | POA: Diagnosis not present

## 2017-09-03 DIAGNOSIS — D631 Anemia in chronic kidney disease: Secondary | ICD-10-CM | POA: Diagnosis not present

## 2017-09-06 DIAGNOSIS — D631 Anemia in chronic kidney disease: Secondary | ICD-10-CM | POA: Diagnosis not present

## 2017-09-06 DIAGNOSIS — N186 End stage renal disease: Secondary | ICD-10-CM | POA: Diagnosis not present

## 2017-09-06 DIAGNOSIS — N2581 Secondary hyperparathyroidism of renal origin: Secondary | ICD-10-CM | POA: Diagnosis not present

## 2017-09-08 DIAGNOSIS — N2581 Secondary hyperparathyroidism of renal origin: Secondary | ICD-10-CM | POA: Diagnosis not present

## 2017-09-08 DIAGNOSIS — N186 End stage renal disease: Secondary | ICD-10-CM | POA: Diagnosis not present

## 2017-09-08 DIAGNOSIS — D631 Anemia in chronic kidney disease: Secondary | ICD-10-CM | POA: Diagnosis not present

## 2017-09-10 DIAGNOSIS — N2581 Secondary hyperparathyroidism of renal origin: Secondary | ICD-10-CM | POA: Diagnosis not present

## 2017-09-10 DIAGNOSIS — N186 End stage renal disease: Secondary | ICD-10-CM | POA: Diagnosis not present

## 2017-09-10 DIAGNOSIS — D631 Anemia in chronic kidney disease: Secondary | ICD-10-CM | POA: Diagnosis not present

## 2017-09-13 DIAGNOSIS — N2581 Secondary hyperparathyroidism of renal origin: Secondary | ICD-10-CM | POA: Diagnosis not present

## 2017-09-13 DIAGNOSIS — D631 Anemia in chronic kidney disease: Secondary | ICD-10-CM | POA: Diagnosis not present

## 2017-09-13 DIAGNOSIS — N186 End stage renal disease: Secondary | ICD-10-CM | POA: Diagnosis not present

## 2017-09-15 DIAGNOSIS — N186 End stage renal disease: Secondary | ICD-10-CM | POA: Diagnosis not present

## 2017-09-15 DIAGNOSIS — D631 Anemia in chronic kidney disease: Secondary | ICD-10-CM | POA: Diagnosis not present

## 2017-09-15 DIAGNOSIS — N2581 Secondary hyperparathyroidism of renal origin: Secondary | ICD-10-CM | POA: Diagnosis not present

## 2017-09-17 DIAGNOSIS — D631 Anemia in chronic kidney disease: Secondary | ICD-10-CM | POA: Diagnosis not present

## 2017-09-17 DIAGNOSIS — N186 End stage renal disease: Secondary | ICD-10-CM | POA: Diagnosis not present

## 2017-09-17 DIAGNOSIS — N2581 Secondary hyperparathyroidism of renal origin: Secondary | ICD-10-CM | POA: Diagnosis not present

## 2017-09-20 DIAGNOSIS — N186 End stage renal disease: Secondary | ICD-10-CM | POA: Diagnosis not present

## 2017-09-20 DIAGNOSIS — N2581 Secondary hyperparathyroidism of renal origin: Secondary | ICD-10-CM | POA: Diagnosis not present

## 2017-09-20 DIAGNOSIS — D631 Anemia in chronic kidney disease: Secondary | ICD-10-CM | POA: Diagnosis not present

## 2017-09-22 DIAGNOSIS — D631 Anemia in chronic kidney disease: Secondary | ICD-10-CM | POA: Diagnosis not present

## 2017-09-22 DIAGNOSIS — N186 End stage renal disease: Secondary | ICD-10-CM | POA: Diagnosis not present

## 2017-09-22 DIAGNOSIS — N2581 Secondary hyperparathyroidism of renal origin: Secondary | ICD-10-CM | POA: Diagnosis not present

## 2017-09-24 DIAGNOSIS — D631 Anemia in chronic kidney disease: Secondary | ICD-10-CM | POA: Diagnosis not present

## 2017-09-24 DIAGNOSIS — N2581 Secondary hyperparathyroidism of renal origin: Secondary | ICD-10-CM | POA: Diagnosis not present

## 2017-09-24 DIAGNOSIS — N186 End stage renal disease: Secondary | ICD-10-CM | POA: Diagnosis not present

## 2017-09-27 DIAGNOSIS — D631 Anemia in chronic kidney disease: Secondary | ICD-10-CM | POA: Diagnosis not present

## 2017-09-27 DIAGNOSIS — N2581 Secondary hyperparathyroidism of renal origin: Secondary | ICD-10-CM | POA: Diagnosis not present

## 2017-09-27 DIAGNOSIS — N186 End stage renal disease: Secondary | ICD-10-CM | POA: Diagnosis not present

## 2017-09-29 DIAGNOSIS — D631 Anemia in chronic kidney disease: Secondary | ICD-10-CM | POA: Diagnosis not present

## 2017-09-29 DIAGNOSIS — N186 End stage renal disease: Secondary | ICD-10-CM | POA: Diagnosis not present

## 2017-09-29 DIAGNOSIS — N2581 Secondary hyperparathyroidism of renal origin: Secondary | ICD-10-CM | POA: Diagnosis not present

## 2017-10-01 DIAGNOSIS — D631 Anemia in chronic kidney disease: Secondary | ICD-10-CM | POA: Diagnosis not present

## 2017-10-01 DIAGNOSIS — N2581 Secondary hyperparathyroidism of renal origin: Secondary | ICD-10-CM | POA: Diagnosis not present

## 2017-10-01 DIAGNOSIS — N186 End stage renal disease: Secondary | ICD-10-CM | POA: Diagnosis not present

## 2017-10-02 DIAGNOSIS — N186 End stage renal disease: Secondary | ICD-10-CM | POA: Diagnosis not present

## 2017-10-02 DIAGNOSIS — I129 Hypertensive chronic kidney disease with stage 1 through stage 4 chronic kidney disease, or unspecified chronic kidney disease: Secondary | ICD-10-CM | POA: Diagnosis not present

## 2017-10-02 DIAGNOSIS — Z992 Dependence on renal dialysis: Secondary | ICD-10-CM | POA: Diagnosis not present

## 2017-10-04 DIAGNOSIS — N2581 Secondary hyperparathyroidism of renal origin: Secondary | ICD-10-CM | POA: Diagnosis not present

## 2017-10-04 DIAGNOSIS — D631 Anemia in chronic kidney disease: Secondary | ICD-10-CM | POA: Diagnosis not present

## 2017-10-04 DIAGNOSIS — N186 End stage renal disease: Secondary | ICD-10-CM | POA: Diagnosis not present

## 2017-10-06 DIAGNOSIS — N2581 Secondary hyperparathyroidism of renal origin: Secondary | ICD-10-CM | POA: Diagnosis not present

## 2017-10-06 DIAGNOSIS — N186 End stage renal disease: Secondary | ICD-10-CM | POA: Diagnosis not present

## 2017-10-06 DIAGNOSIS — D631 Anemia in chronic kidney disease: Secondary | ICD-10-CM | POA: Diagnosis not present

## 2017-10-08 DIAGNOSIS — N2581 Secondary hyperparathyroidism of renal origin: Secondary | ICD-10-CM | POA: Diagnosis not present

## 2017-10-08 DIAGNOSIS — D631 Anemia in chronic kidney disease: Secondary | ICD-10-CM | POA: Diagnosis not present

## 2017-10-08 DIAGNOSIS — N186 End stage renal disease: Secondary | ICD-10-CM | POA: Diagnosis not present

## 2017-10-11 DIAGNOSIS — D631 Anemia in chronic kidney disease: Secondary | ICD-10-CM | POA: Diagnosis not present

## 2017-10-11 DIAGNOSIS — N186 End stage renal disease: Secondary | ICD-10-CM | POA: Diagnosis not present

## 2017-10-11 DIAGNOSIS — N2581 Secondary hyperparathyroidism of renal origin: Secondary | ICD-10-CM | POA: Diagnosis not present

## 2017-10-13 DIAGNOSIS — D631 Anemia in chronic kidney disease: Secondary | ICD-10-CM | POA: Diagnosis not present

## 2017-10-13 DIAGNOSIS — N186 End stage renal disease: Secondary | ICD-10-CM | POA: Diagnosis not present

## 2017-10-13 DIAGNOSIS — N2581 Secondary hyperparathyroidism of renal origin: Secondary | ICD-10-CM | POA: Diagnosis not present

## 2017-10-15 DIAGNOSIS — D631 Anemia in chronic kidney disease: Secondary | ICD-10-CM | POA: Diagnosis not present

## 2017-10-15 DIAGNOSIS — N186 End stage renal disease: Secondary | ICD-10-CM | POA: Diagnosis not present

## 2017-10-15 DIAGNOSIS — N2581 Secondary hyperparathyroidism of renal origin: Secondary | ICD-10-CM | POA: Diagnosis not present

## 2017-10-18 DIAGNOSIS — D631 Anemia in chronic kidney disease: Secondary | ICD-10-CM | POA: Diagnosis not present

## 2017-10-18 DIAGNOSIS — N2581 Secondary hyperparathyroidism of renal origin: Secondary | ICD-10-CM | POA: Diagnosis not present

## 2017-10-18 DIAGNOSIS — N186 End stage renal disease: Secondary | ICD-10-CM | POA: Diagnosis not present

## 2017-10-20 DIAGNOSIS — N2581 Secondary hyperparathyroidism of renal origin: Secondary | ICD-10-CM | POA: Diagnosis not present

## 2017-10-20 DIAGNOSIS — D631 Anemia in chronic kidney disease: Secondary | ICD-10-CM | POA: Diagnosis not present

## 2017-10-20 DIAGNOSIS — N186 End stage renal disease: Secondary | ICD-10-CM | POA: Diagnosis not present

## 2017-10-22 DIAGNOSIS — N186 End stage renal disease: Secondary | ICD-10-CM | POA: Diagnosis not present

## 2017-10-22 DIAGNOSIS — N2581 Secondary hyperparathyroidism of renal origin: Secondary | ICD-10-CM | POA: Diagnosis not present

## 2017-10-22 DIAGNOSIS — D631 Anemia in chronic kidney disease: Secondary | ICD-10-CM | POA: Diagnosis not present

## 2017-10-25 DIAGNOSIS — D631 Anemia in chronic kidney disease: Secondary | ICD-10-CM | POA: Diagnosis not present

## 2017-10-25 DIAGNOSIS — N186 End stage renal disease: Secondary | ICD-10-CM | POA: Diagnosis not present

## 2017-10-25 DIAGNOSIS — N2581 Secondary hyperparathyroidism of renal origin: Secondary | ICD-10-CM | POA: Diagnosis not present

## 2017-10-27 ENCOUNTER — Encounter: Payer: Self-pay | Admitting: *Deleted

## 2017-10-27 DIAGNOSIS — D631 Anemia in chronic kidney disease: Secondary | ICD-10-CM | POA: Diagnosis not present

## 2017-10-27 DIAGNOSIS — N186 End stage renal disease: Secondary | ICD-10-CM | POA: Diagnosis not present

## 2017-10-27 DIAGNOSIS — N2581 Secondary hyperparathyroidism of renal origin: Secondary | ICD-10-CM | POA: Diagnosis not present

## 2017-10-29 DIAGNOSIS — N2581 Secondary hyperparathyroidism of renal origin: Secondary | ICD-10-CM | POA: Diagnosis not present

## 2017-10-29 DIAGNOSIS — N186 End stage renal disease: Secondary | ICD-10-CM | POA: Diagnosis not present

## 2017-10-29 DIAGNOSIS — D631 Anemia in chronic kidney disease: Secondary | ICD-10-CM | POA: Diagnosis not present

## 2017-11-01 DIAGNOSIS — D509 Iron deficiency anemia, unspecified: Secondary | ICD-10-CM | POA: Diagnosis not present

## 2017-11-01 DIAGNOSIS — Z992 Dependence on renal dialysis: Secondary | ICD-10-CM | POA: Diagnosis not present

## 2017-11-01 DIAGNOSIS — I129 Hypertensive chronic kidney disease with stage 1 through stage 4 chronic kidney disease, or unspecified chronic kidney disease: Secondary | ICD-10-CM | POA: Diagnosis not present

## 2017-11-01 DIAGNOSIS — N186 End stage renal disease: Secondary | ICD-10-CM | POA: Diagnosis not present

## 2017-11-01 DIAGNOSIS — D631 Anemia in chronic kidney disease: Secondary | ICD-10-CM | POA: Diagnosis not present

## 2017-11-01 DIAGNOSIS — N2581 Secondary hyperparathyroidism of renal origin: Secondary | ICD-10-CM | POA: Diagnosis not present

## 2017-11-03 DIAGNOSIS — D631 Anemia in chronic kidney disease: Secondary | ICD-10-CM | POA: Diagnosis not present

## 2017-11-03 DIAGNOSIS — N2581 Secondary hyperparathyroidism of renal origin: Secondary | ICD-10-CM | POA: Diagnosis not present

## 2017-11-03 DIAGNOSIS — D509 Iron deficiency anemia, unspecified: Secondary | ICD-10-CM | POA: Diagnosis not present

## 2017-11-03 DIAGNOSIS — N186 End stage renal disease: Secondary | ICD-10-CM | POA: Diagnosis not present

## 2017-11-05 DIAGNOSIS — N2581 Secondary hyperparathyroidism of renal origin: Secondary | ICD-10-CM | POA: Diagnosis not present

## 2017-11-05 DIAGNOSIS — D509 Iron deficiency anemia, unspecified: Secondary | ICD-10-CM | POA: Diagnosis not present

## 2017-11-05 DIAGNOSIS — N186 End stage renal disease: Secondary | ICD-10-CM | POA: Diagnosis not present

## 2017-11-05 DIAGNOSIS — D631 Anemia in chronic kidney disease: Secondary | ICD-10-CM | POA: Diagnosis not present

## 2017-11-08 DIAGNOSIS — N2581 Secondary hyperparathyroidism of renal origin: Secondary | ICD-10-CM | POA: Diagnosis not present

## 2017-11-08 DIAGNOSIS — D509 Iron deficiency anemia, unspecified: Secondary | ICD-10-CM | POA: Diagnosis not present

## 2017-11-08 DIAGNOSIS — D631 Anemia in chronic kidney disease: Secondary | ICD-10-CM | POA: Diagnosis not present

## 2017-11-08 DIAGNOSIS — N186 End stage renal disease: Secondary | ICD-10-CM | POA: Diagnosis not present

## 2017-11-10 DIAGNOSIS — D631 Anemia in chronic kidney disease: Secondary | ICD-10-CM | POA: Diagnosis not present

## 2017-11-10 DIAGNOSIS — N186 End stage renal disease: Secondary | ICD-10-CM | POA: Diagnosis not present

## 2017-11-10 DIAGNOSIS — N2581 Secondary hyperparathyroidism of renal origin: Secondary | ICD-10-CM | POA: Diagnosis not present

## 2017-11-10 DIAGNOSIS — D509 Iron deficiency anemia, unspecified: Secondary | ICD-10-CM | POA: Diagnosis not present

## 2017-11-12 DIAGNOSIS — D509 Iron deficiency anemia, unspecified: Secondary | ICD-10-CM | POA: Diagnosis not present

## 2017-11-12 DIAGNOSIS — D631 Anemia in chronic kidney disease: Secondary | ICD-10-CM | POA: Diagnosis not present

## 2017-11-12 DIAGNOSIS — N2581 Secondary hyperparathyroidism of renal origin: Secondary | ICD-10-CM | POA: Diagnosis not present

## 2017-11-12 DIAGNOSIS — N186 End stage renal disease: Secondary | ICD-10-CM | POA: Diagnosis not present

## 2017-11-15 DIAGNOSIS — N186 End stage renal disease: Secondary | ICD-10-CM | POA: Diagnosis not present

## 2017-11-15 DIAGNOSIS — N2581 Secondary hyperparathyroidism of renal origin: Secondary | ICD-10-CM | POA: Diagnosis not present

## 2017-11-15 DIAGNOSIS — D509 Iron deficiency anemia, unspecified: Secondary | ICD-10-CM | POA: Diagnosis not present

## 2017-11-15 DIAGNOSIS — D631 Anemia in chronic kidney disease: Secondary | ICD-10-CM | POA: Diagnosis not present

## 2017-11-17 DIAGNOSIS — D509 Iron deficiency anemia, unspecified: Secondary | ICD-10-CM | POA: Diagnosis not present

## 2017-11-17 DIAGNOSIS — N2581 Secondary hyperparathyroidism of renal origin: Secondary | ICD-10-CM | POA: Diagnosis not present

## 2017-11-17 DIAGNOSIS — D631 Anemia in chronic kidney disease: Secondary | ICD-10-CM | POA: Diagnosis not present

## 2017-11-17 DIAGNOSIS — N186 End stage renal disease: Secondary | ICD-10-CM | POA: Diagnosis not present

## 2017-11-19 DIAGNOSIS — N186 End stage renal disease: Secondary | ICD-10-CM | POA: Diagnosis not present

## 2017-11-19 DIAGNOSIS — D631 Anemia in chronic kidney disease: Secondary | ICD-10-CM | POA: Diagnosis not present

## 2017-11-19 DIAGNOSIS — D509 Iron deficiency anemia, unspecified: Secondary | ICD-10-CM | POA: Diagnosis not present

## 2017-11-19 DIAGNOSIS — N2581 Secondary hyperparathyroidism of renal origin: Secondary | ICD-10-CM | POA: Diagnosis not present

## 2017-11-22 DIAGNOSIS — N2581 Secondary hyperparathyroidism of renal origin: Secondary | ICD-10-CM | POA: Diagnosis not present

## 2017-11-22 DIAGNOSIS — D509 Iron deficiency anemia, unspecified: Secondary | ICD-10-CM | POA: Diagnosis not present

## 2017-11-22 DIAGNOSIS — N186 End stage renal disease: Secondary | ICD-10-CM | POA: Diagnosis not present

## 2017-11-22 DIAGNOSIS — D631 Anemia in chronic kidney disease: Secondary | ICD-10-CM | POA: Diagnosis not present

## 2017-11-24 DIAGNOSIS — N2581 Secondary hyperparathyroidism of renal origin: Secondary | ICD-10-CM | POA: Diagnosis not present

## 2017-11-24 DIAGNOSIS — D509 Iron deficiency anemia, unspecified: Secondary | ICD-10-CM | POA: Diagnosis not present

## 2017-11-24 DIAGNOSIS — N186 End stage renal disease: Secondary | ICD-10-CM | POA: Diagnosis not present

## 2017-11-24 DIAGNOSIS — D631 Anemia in chronic kidney disease: Secondary | ICD-10-CM | POA: Diagnosis not present

## 2017-11-26 DIAGNOSIS — D509 Iron deficiency anemia, unspecified: Secondary | ICD-10-CM | POA: Diagnosis not present

## 2017-11-26 DIAGNOSIS — N186 End stage renal disease: Secondary | ICD-10-CM | POA: Diagnosis not present

## 2017-11-26 DIAGNOSIS — D631 Anemia in chronic kidney disease: Secondary | ICD-10-CM | POA: Diagnosis not present

## 2017-11-26 DIAGNOSIS — N2581 Secondary hyperparathyroidism of renal origin: Secondary | ICD-10-CM | POA: Diagnosis not present

## 2017-11-29 DIAGNOSIS — N186 End stage renal disease: Secondary | ICD-10-CM | POA: Diagnosis not present

## 2017-11-29 DIAGNOSIS — D509 Iron deficiency anemia, unspecified: Secondary | ICD-10-CM | POA: Diagnosis not present

## 2017-11-29 DIAGNOSIS — D631 Anemia in chronic kidney disease: Secondary | ICD-10-CM | POA: Diagnosis not present

## 2017-11-29 DIAGNOSIS — N2581 Secondary hyperparathyroidism of renal origin: Secondary | ICD-10-CM | POA: Diagnosis not present

## 2017-12-01 DIAGNOSIS — D631 Anemia in chronic kidney disease: Secondary | ICD-10-CM | POA: Diagnosis not present

## 2017-12-01 DIAGNOSIS — D509 Iron deficiency anemia, unspecified: Secondary | ICD-10-CM | POA: Diagnosis not present

## 2017-12-01 DIAGNOSIS — N186 End stage renal disease: Secondary | ICD-10-CM | POA: Diagnosis not present

## 2017-12-01 DIAGNOSIS — N2581 Secondary hyperparathyroidism of renal origin: Secondary | ICD-10-CM | POA: Diagnosis not present

## 2017-12-02 DIAGNOSIS — Z992 Dependence on renal dialysis: Secondary | ICD-10-CM | POA: Diagnosis not present

## 2017-12-02 DIAGNOSIS — N186 End stage renal disease: Secondary | ICD-10-CM | POA: Diagnosis not present

## 2017-12-02 DIAGNOSIS — I129 Hypertensive chronic kidney disease with stage 1 through stage 4 chronic kidney disease, or unspecified chronic kidney disease: Secondary | ICD-10-CM | POA: Diagnosis not present

## 2017-12-03 DIAGNOSIS — N186 End stage renal disease: Secondary | ICD-10-CM | POA: Diagnosis not present

## 2017-12-03 DIAGNOSIS — N2581 Secondary hyperparathyroidism of renal origin: Secondary | ICD-10-CM | POA: Diagnosis not present

## 2017-12-03 DIAGNOSIS — D509 Iron deficiency anemia, unspecified: Secondary | ICD-10-CM | POA: Diagnosis not present

## 2017-12-03 DIAGNOSIS — D631 Anemia in chronic kidney disease: Secondary | ICD-10-CM | POA: Diagnosis not present

## 2017-12-06 DIAGNOSIS — N2581 Secondary hyperparathyroidism of renal origin: Secondary | ICD-10-CM | POA: Diagnosis not present

## 2017-12-06 DIAGNOSIS — D509 Iron deficiency anemia, unspecified: Secondary | ICD-10-CM | POA: Diagnosis not present

## 2017-12-06 DIAGNOSIS — N186 End stage renal disease: Secondary | ICD-10-CM | POA: Diagnosis not present

## 2017-12-06 DIAGNOSIS — D631 Anemia in chronic kidney disease: Secondary | ICD-10-CM | POA: Diagnosis not present

## 2017-12-08 DIAGNOSIS — N2581 Secondary hyperparathyroidism of renal origin: Secondary | ICD-10-CM | POA: Diagnosis not present

## 2017-12-08 DIAGNOSIS — N186 End stage renal disease: Secondary | ICD-10-CM | POA: Diagnosis not present

## 2017-12-08 DIAGNOSIS — D509 Iron deficiency anemia, unspecified: Secondary | ICD-10-CM | POA: Diagnosis not present

## 2017-12-08 DIAGNOSIS — D631 Anemia in chronic kidney disease: Secondary | ICD-10-CM | POA: Diagnosis not present

## 2017-12-10 DIAGNOSIS — N186 End stage renal disease: Secondary | ICD-10-CM | POA: Diagnosis not present

## 2017-12-10 DIAGNOSIS — D509 Iron deficiency anemia, unspecified: Secondary | ICD-10-CM | POA: Diagnosis not present

## 2017-12-10 DIAGNOSIS — D631 Anemia in chronic kidney disease: Secondary | ICD-10-CM | POA: Diagnosis not present

## 2017-12-10 DIAGNOSIS — N2581 Secondary hyperparathyroidism of renal origin: Secondary | ICD-10-CM | POA: Diagnosis not present

## 2017-12-13 DIAGNOSIS — N186 End stage renal disease: Secondary | ICD-10-CM | POA: Diagnosis not present

## 2017-12-13 DIAGNOSIS — N2581 Secondary hyperparathyroidism of renal origin: Secondary | ICD-10-CM | POA: Diagnosis not present

## 2017-12-13 DIAGNOSIS — D631 Anemia in chronic kidney disease: Secondary | ICD-10-CM | POA: Diagnosis not present

## 2017-12-13 DIAGNOSIS — D509 Iron deficiency anemia, unspecified: Secondary | ICD-10-CM | POA: Diagnosis not present

## 2017-12-15 DIAGNOSIS — D509 Iron deficiency anemia, unspecified: Secondary | ICD-10-CM | POA: Diagnosis not present

## 2017-12-15 DIAGNOSIS — D631 Anemia in chronic kidney disease: Secondary | ICD-10-CM | POA: Diagnosis not present

## 2017-12-15 DIAGNOSIS — N2581 Secondary hyperparathyroidism of renal origin: Secondary | ICD-10-CM | POA: Diagnosis not present

## 2017-12-15 DIAGNOSIS — N186 End stage renal disease: Secondary | ICD-10-CM | POA: Diagnosis not present

## 2017-12-17 DIAGNOSIS — D631 Anemia in chronic kidney disease: Secondary | ICD-10-CM | POA: Diagnosis not present

## 2017-12-17 DIAGNOSIS — N2581 Secondary hyperparathyroidism of renal origin: Secondary | ICD-10-CM | POA: Diagnosis not present

## 2017-12-17 DIAGNOSIS — D509 Iron deficiency anemia, unspecified: Secondary | ICD-10-CM | POA: Diagnosis not present

## 2017-12-17 DIAGNOSIS — N186 End stage renal disease: Secondary | ICD-10-CM | POA: Diagnosis not present

## 2017-12-20 DIAGNOSIS — D631 Anemia in chronic kidney disease: Secondary | ICD-10-CM | POA: Diagnosis not present

## 2017-12-20 DIAGNOSIS — N186 End stage renal disease: Secondary | ICD-10-CM | POA: Diagnosis not present

## 2017-12-20 DIAGNOSIS — N2581 Secondary hyperparathyroidism of renal origin: Secondary | ICD-10-CM | POA: Diagnosis not present

## 2017-12-20 DIAGNOSIS — D509 Iron deficiency anemia, unspecified: Secondary | ICD-10-CM | POA: Diagnosis not present

## 2017-12-22 DIAGNOSIS — D631 Anemia in chronic kidney disease: Secondary | ICD-10-CM | POA: Diagnosis not present

## 2017-12-22 DIAGNOSIS — N186 End stage renal disease: Secondary | ICD-10-CM | POA: Diagnosis not present

## 2017-12-22 DIAGNOSIS — D509 Iron deficiency anemia, unspecified: Secondary | ICD-10-CM | POA: Diagnosis not present

## 2017-12-22 DIAGNOSIS — N2581 Secondary hyperparathyroidism of renal origin: Secondary | ICD-10-CM | POA: Diagnosis not present

## 2017-12-24 DIAGNOSIS — D631 Anemia in chronic kidney disease: Secondary | ICD-10-CM | POA: Diagnosis not present

## 2017-12-24 DIAGNOSIS — N2581 Secondary hyperparathyroidism of renal origin: Secondary | ICD-10-CM | POA: Diagnosis not present

## 2017-12-24 DIAGNOSIS — N186 End stage renal disease: Secondary | ICD-10-CM | POA: Diagnosis not present

## 2017-12-24 DIAGNOSIS — D509 Iron deficiency anemia, unspecified: Secondary | ICD-10-CM | POA: Diagnosis not present

## 2017-12-27 DIAGNOSIS — N186 End stage renal disease: Secondary | ICD-10-CM | POA: Diagnosis not present

## 2017-12-27 DIAGNOSIS — N2581 Secondary hyperparathyroidism of renal origin: Secondary | ICD-10-CM | POA: Diagnosis not present

## 2017-12-27 DIAGNOSIS — D631 Anemia in chronic kidney disease: Secondary | ICD-10-CM | POA: Diagnosis not present

## 2017-12-27 DIAGNOSIS — D509 Iron deficiency anemia, unspecified: Secondary | ICD-10-CM | POA: Diagnosis not present

## 2017-12-29 DIAGNOSIS — D631 Anemia in chronic kidney disease: Secondary | ICD-10-CM | POA: Diagnosis not present

## 2017-12-29 DIAGNOSIS — N2581 Secondary hyperparathyroidism of renal origin: Secondary | ICD-10-CM | POA: Diagnosis not present

## 2017-12-29 DIAGNOSIS — D509 Iron deficiency anemia, unspecified: Secondary | ICD-10-CM | POA: Diagnosis not present

## 2017-12-29 DIAGNOSIS — N186 End stage renal disease: Secondary | ICD-10-CM | POA: Diagnosis not present

## 2017-12-31 DIAGNOSIS — D509 Iron deficiency anemia, unspecified: Secondary | ICD-10-CM | POA: Diagnosis not present

## 2017-12-31 DIAGNOSIS — D631 Anemia in chronic kidney disease: Secondary | ICD-10-CM | POA: Diagnosis not present

## 2017-12-31 DIAGNOSIS — N186 End stage renal disease: Secondary | ICD-10-CM | POA: Diagnosis not present

## 2017-12-31 DIAGNOSIS — N2581 Secondary hyperparathyroidism of renal origin: Secondary | ICD-10-CM | POA: Diagnosis not present

## 2018-01-02 DIAGNOSIS — Z992 Dependence on renal dialysis: Secondary | ICD-10-CM | POA: Diagnosis not present

## 2018-01-02 DIAGNOSIS — I129 Hypertensive chronic kidney disease with stage 1 through stage 4 chronic kidney disease, or unspecified chronic kidney disease: Secondary | ICD-10-CM | POA: Diagnosis not present

## 2018-01-02 DIAGNOSIS — N186 End stage renal disease: Secondary | ICD-10-CM | POA: Diagnosis not present

## 2018-01-03 DIAGNOSIS — N186 End stage renal disease: Secondary | ICD-10-CM | POA: Diagnosis not present

## 2018-01-03 DIAGNOSIS — D631 Anemia in chronic kidney disease: Secondary | ICD-10-CM | POA: Diagnosis not present

## 2018-01-03 DIAGNOSIS — N2581 Secondary hyperparathyroidism of renal origin: Secondary | ICD-10-CM | POA: Diagnosis not present

## 2018-01-05 DIAGNOSIS — D631 Anemia in chronic kidney disease: Secondary | ICD-10-CM | POA: Diagnosis not present

## 2018-01-05 DIAGNOSIS — N186 End stage renal disease: Secondary | ICD-10-CM | POA: Diagnosis not present

## 2018-01-05 DIAGNOSIS — N2581 Secondary hyperparathyroidism of renal origin: Secondary | ICD-10-CM | POA: Diagnosis not present

## 2018-01-06 DIAGNOSIS — N186 End stage renal disease: Secondary | ICD-10-CM | POA: Diagnosis not present

## 2018-01-06 DIAGNOSIS — D631 Anemia in chronic kidney disease: Secondary | ICD-10-CM | POA: Diagnosis not present

## 2018-01-06 DIAGNOSIS — N2581 Secondary hyperparathyroidism of renal origin: Secondary | ICD-10-CM | POA: Diagnosis not present

## 2018-01-07 DIAGNOSIS — N186 End stage renal disease: Secondary | ICD-10-CM | POA: Diagnosis not present

## 2018-01-07 DIAGNOSIS — N2581 Secondary hyperparathyroidism of renal origin: Secondary | ICD-10-CM | POA: Diagnosis not present

## 2018-01-07 DIAGNOSIS — D631 Anemia in chronic kidney disease: Secondary | ICD-10-CM | POA: Diagnosis not present

## 2018-01-10 DIAGNOSIS — D631 Anemia in chronic kidney disease: Secondary | ICD-10-CM | POA: Diagnosis not present

## 2018-01-10 DIAGNOSIS — N2581 Secondary hyperparathyroidism of renal origin: Secondary | ICD-10-CM | POA: Diagnosis not present

## 2018-01-10 DIAGNOSIS — N186 End stage renal disease: Secondary | ICD-10-CM | POA: Diagnosis not present

## 2018-01-12 DIAGNOSIS — N2581 Secondary hyperparathyroidism of renal origin: Secondary | ICD-10-CM | POA: Diagnosis not present

## 2018-01-12 DIAGNOSIS — D631 Anemia in chronic kidney disease: Secondary | ICD-10-CM | POA: Diagnosis not present

## 2018-01-12 DIAGNOSIS — N186 End stage renal disease: Secondary | ICD-10-CM | POA: Diagnosis not present

## 2018-01-14 DIAGNOSIS — N186 End stage renal disease: Secondary | ICD-10-CM | POA: Diagnosis not present

## 2018-01-14 DIAGNOSIS — N2581 Secondary hyperparathyroidism of renal origin: Secondary | ICD-10-CM | POA: Diagnosis not present

## 2018-01-14 DIAGNOSIS — D631 Anemia in chronic kidney disease: Secondary | ICD-10-CM | POA: Diagnosis not present

## 2018-01-17 DIAGNOSIS — D631 Anemia in chronic kidney disease: Secondary | ICD-10-CM | POA: Diagnosis not present

## 2018-01-17 DIAGNOSIS — N186 End stage renal disease: Secondary | ICD-10-CM | POA: Diagnosis not present

## 2018-01-17 DIAGNOSIS — N2581 Secondary hyperparathyroidism of renal origin: Secondary | ICD-10-CM | POA: Diagnosis not present

## 2018-01-19 DIAGNOSIS — D631 Anemia in chronic kidney disease: Secondary | ICD-10-CM | POA: Diagnosis not present

## 2018-01-19 DIAGNOSIS — N2581 Secondary hyperparathyroidism of renal origin: Secondary | ICD-10-CM | POA: Diagnosis not present

## 2018-01-19 DIAGNOSIS — N186 End stage renal disease: Secondary | ICD-10-CM | POA: Diagnosis not present

## 2018-01-21 DIAGNOSIS — N2581 Secondary hyperparathyroidism of renal origin: Secondary | ICD-10-CM | POA: Diagnosis not present

## 2018-01-21 DIAGNOSIS — N186 End stage renal disease: Secondary | ICD-10-CM | POA: Diagnosis not present

## 2018-01-21 DIAGNOSIS — D631 Anemia in chronic kidney disease: Secondary | ICD-10-CM | POA: Diagnosis not present

## 2018-01-24 DIAGNOSIS — N2581 Secondary hyperparathyroidism of renal origin: Secondary | ICD-10-CM | POA: Diagnosis not present

## 2018-01-24 DIAGNOSIS — D631 Anemia in chronic kidney disease: Secondary | ICD-10-CM | POA: Diagnosis not present

## 2018-01-24 DIAGNOSIS — N186 End stage renal disease: Secondary | ICD-10-CM | POA: Diagnosis not present

## 2018-01-26 DIAGNOSIS — N2581 Secondary hyperparathyroidism of renal origin: Secondary | ICD-10-CM | POA: Diagnosis not present

## 2018-01-26 DIAGNOSIS — D631 Anemia in chronic kidney disease: Secondary | ICD-10-CM | POA: Diagnosis not present

## 2018-01-26 DIAGNOSIS — N186 End stage renal disease: Secondary | ICD-10-CM | POA: Diagnosis not present

## 2018-01-28 DIAGNOSIS — N186 End stage renal disease: Secondary | ICD-10-CM | POA: Diagnosis not present

## 2018-01-28 DIAGNOSIS — N2581 Secondary hyperparathyroidism of renal origin: Secondary | ICD-10-CM | POA: Diagnosis not present

## 2018-01-28 DIAGNOSIS — D631 Anemia in chronic kidney disease: Secondary | ICD-10-CM | POA: Diagnosis not present

## 2018-01-31 DIAGNOSIS — N2581 Secondary hyperparathyroidism of renal origin: Secondary | ICD-10-CM | POA: Diagnosis not present

## 2018-01-31 DIAGNOSIS — D631 Anemia in chronic kidney disease: Secondary | ICD-10-CM | POA: Diagnosis not present

## 2018-01-31 DIAGNOSIS — N186 End stage renal disease: Secondary | ICD-10-CM | POA: Diagnosis not present

## 2018-02-01 DIAGNOSIS — I129 Hypertensive chronic kidney disease with stage 1 through stage 4 chronic kidney disease, or unspecified chronic kidney disease: Secondary | ICD-10-CM | POA: Diagnosis not present

## 2018-02-01 DIAGNOSIS — Z992 Dependence on renal dialysis: Secondary | ICD-10-CM | POA: Diagnosis not present

## 2018-02-01 DIAGNOSIS — N186 End stage renal disease: Secondary | ICD-10-CM | POA: Diagnosis not present

## 2018-02-02 DIAGNOSIS — N2581 Secondary hyperparathyroidism of renal origin: Secondary | ICD-10-CM | POA: Diagnosis not present

## 2018-02-02 DIAGNOSIS — D631 Anemia in chronic kidney disease: Secondary | ICD-10-CM | POA: Diagnosis not present

## 2018-02-02 DIAGNOSIS — N186 End stage renal disease: Secondary | ICD-10-CM | POA: Diagnosis not present

## 2018-02-04 DIAGNOSIS — D631 Anemia in chronic kidney disease: Secondary | ICD-10-CM | POA: Diagnosis not present

## 2018-02-04 DIAGNOSIS — N2581 Secondary hyperparathyroidism of renal origin: Secondary | ICD-10-CM | POA: Diagnosis not present

## 2018-02-04 DIAGNOSIS — N186 End stage renal disease: Secondary | ICD-10-CM | POA: Diagnosis not present

## 2018-02-07 DIAGNOSIS — D631 Anemia in chronic kidney disease: Secondary | ICD-10-CM | POA: Diagnosis not present

## 2018-02-07 DIAGNOSIS — N2581 Secondary hyperparathyroidism of renal origin: Secondary | ICD-10-CM | POA: Diagnosis not present

## 2018-02-07 DIAGNOSIS — N186 End stage renal disease: Secondary | ICD-10-CM | POA: Diagnosis not present

## 2018-02-09 DIAGNOSIS — D631 Anemia in chronic kidney disease: Secondary | ICD-10-CM | POA: Diagnosis not present

## 2018-02-09 DIAGNOSIS — N186 End stage renal disease: Secondary | ICD-10-CM | POA: Diagnosis not present

## 2018-02-09 DIAGNOSIS — N2581 Secondary hyperparathyroidism of renal origin: Secondary | ICD-10-CM | POA: Diagnosis not present

## 2018-02-11 DIAGNOSIS — D631 Anemia in chronic kidney disease: Secondary | ICD-10-CM | POA: Diagnosis not present

## 2018-02-11 DIAGNOSIS — N2581 Secondary hyperparathyroidism of renal origin: Secondary | ICD-10-CM | POA: Diagnosis not present

## 2018-02-11 DIAGNOSIS — N186 End stage renal disease: Secondary | ICD-10-CM | POA: Diagnosis not present

## 2018-02-14 DIAGNOSIS — D631 Anemia in chronic kidney disease: Secondary | ICD-10-CM | POA: Diagnosis not present

## 2018-02-14 DIAGNOSIS — N186 End stage renal disease: Secondary | ICD-10-CM | POA: Diagnosis not present

## 2018-02-14 DIAGNOSIS — N2581 Secondary hyperparathyroidism of renal origin: Secondary | ICD-10-CM | POA: Diagnosis not present

## 2018-02-16 DIAGNOSIS — N186 End stage renal disease: Secondary | ICD-10-CM | POA: Diagnosis not present

## 2018-02-16 DIAGNOSIS — N2581 Secondary hyperparathyroidism of renal origin: Secondary | ICD-10-CM | POA: Diagnosis not present

## 2018-02-16 DIAGNOSIS — D631 Anemia in chronic kidney disease: Secondary | ICD-10-CM | POA: Diagnosis not present

## 2018-02-18 DIAGNOSIS — D631 Anemia in chronic kidney disease: Secondary | ICD-10-CM | POA: Diagnosis not present

## 2018-02-18 DIAGNOSIS — N186 End stage renal disease: Secondary | ICD-10-CM | POA: Diagnosis not present

## 2018-02-18 DIAGNOSIS — N2581 Secondary hyperparathyroidism of renal origin: Secondary | ICD-10-CM | POA: Diagnosis not present

## 2018-02-21 DIAGNOSIS — N186 End stage renal disease: Secondary | ICD-10-CM | POA: Diagnosis not present

## 2018-02-21 DIAGNOSIS — N2581 Secondary hyperparathyroidism of renal origin: Secondary | ICD-10-CM | POA: Diagnosis not present

## 2018-02-21 DIAGNOSIS — D631 Anemia in chronic kidney disease: Secondary | ICD-10-CM | POA: Diagnosis not present

## 2018-02-23 DIAGNOSIS — N186 End stage renal disease: Secondary | ICD-10-CM | POA: Diagnosis not present

## 2018-02-23 DIAGNOSIS — N2581 Secondary hyperparathyroidism of renal origin: Secondary | ICD-10-CM | POA: Diagnosis not present

## 2018-02-23 DIAGNOSIS — D631 Anemia in chronic kidney disease: Secondary | ICD-10-CM | POA: Diagnosis not present

## 2018-02-25 DIAGNOSIS — D631 Anemia in chronic kidney disease: Secondary | ICD-10-CM | POA: Diagnosis not present

## 2018-02-25 DIAGNOSIS — N2581 Secondary hyperparathyroidism of renal origin: Secondary | ICD-10-CM | POA: Diagnosis not present

## 2018-02-25 DIAGNOSIS — N186 End stage renal disease: Secondary | ICD-10-CM | POA: Diagnosis not present

## 2018-02-28 DIAGNOSIS — N2581 Secondary hyperparathyroidism of renal origin: Secondary | ICD-10-CM | POA: Diagnosis not present

## 2018-02-28 DIAGNOSIS — N186 End stage renal disease: Secondary | ICD-10-CM | POA: Diagnosis not present

## 2018-02-28 DIAGNOSIS — D631 Anemia in chronic kidney disease: Secondary | ICD-10-CM | POA: Diagnosis not present

## 2018-03-02 DIAGNOSIS — N186 End stage renal disease: Secondary | ICD-10-CM | POA: Diagnosis not present

## 2018-03-02 DIAGNOSIS — D631 Anemia in chronic kidney disease: Secondary | ICD-10-CM | POA: Diagnosis not present

## 2018-03-02 DIAGNOSIS — N2581 Secondary hyperparathyroidism of renal origin: Secondary | ICD-10-CM | POA: Diagnosis not present

## 2018-03-04 DIAGNOSIS — N186 End stage renal disease: Secondary | ICD-10-CM | POA: Diagnosis not present

## 2018-03-04 DIAGNOSIS — Z992 Dependence on renal dialysis: Secondary | ICD-10-CM | POA: Diagnosis not present

## 2018-03-04 DIAGNOSIS — D631 Anemia in chronic kidney disease: Secondary | ICD-10-CM | POA: Diagnosis not present

## 2018-03-04 DIAGNOSIS — I129 Hypertensive chronic kidney disease with stage 1 through stage 4 chronic kidney disease, or unspecified chronic kidney disease: Secondary | ICD-10-CM | POA: Diagnosis not present

## 2018-03-04 DIAGNOSIS — D509 Iron deficiency anemia, unspecified: Secondary | ICD-10-CM | POA: Diagnosis not present

## 2018-03-04 DIAGNOSIS — N2581 Secondary hyperparathyroidism of renal origin: Secondary | ICD-10-CM | POA: Diagnosis not present

## 2018-03-07 DIAGNOSIS — N2581 Secondary hyperparathyroidism of renal origin: Secondary | ICD-10-CM | POA: Diagnosis not present

## 2018-03-07 DIAGNOSIS — D509 Iron deficiency anemia, unspecified: Secondary | ICD-10-CM | POA: Diagnosis not present

## 2018-03-07 DIAGNOSIS — N186 End stage renal disease: Secondary | ICD-10-CM | POA: Diagnosis not present

## 2018-03-07 DIAGNOSIS — D631 Anemia in chronic kidney disease: Secondary | ICD-10-CM | POA: Diagnosis not present

## 2018-03-09 DIAGNOSIS — N2581 Secondary hyperparathyroidism of renal origin: Secondary | ICD-10-CM | POA: Diagnosis not present

## 2018-03-09 DIAGNOSIS — D509 Iron deficiency anemia, unspecified: Secondary | ICD-10-CM | POA: Diagnosis not present

## 2018-03-09 DIAGNOSIS — N186 End stage renal disease: Secondary | ICD-10-CM | POA: Diagnosis not present

## 2018-03-09 DIAGNOSIS — D631 Anemia in chronic kidney disease: Secondary | ICD-10-CM | POA: Diagnosis not present

## 2018-03-11 DIAGNOSIS — D509 Iron deficiency anemia, unspecified: Secondary | ICD-10-CM | POA: Diagnosis not present

## 2018-03-11 DIAGNOSIS — N186 End stage renal disease: Secondary | ICD-10-CM | POA: Diagnosis not present

## 2018-03-11 DIAGNOSIS — N2581 Secondary hyperparathyroidism of renal origin: Secondary | ICD-10-CM | POA: Diagnosis not present

## 2018-03-11 DIAGNOSIS — D631 Anemia in chronic kidney disease: Secondary | ICD-10-CM | POA: Diagnosis not present

## 2018-03-14 DIAGNOSIS — D509 Iron deficiency anemia, unspecified: Secondary | ICD-10-CM | POA: Diagnosis not present

## 2018-03-14 DIAGNOSIS — N2581 Secondary hyperparathyroidism of renal origin: Secondary | ICD-10-CM | POA: Diagnosis not present

## 2018-03-14 DIAGNOSIS — N186 End stage renal disease: Secondary | ICD-10-CM | POA: Diagnosis not present

## 2018-03-14 DIAGNOSIS — D631 Anemia in chronic kidney disease: Secondary | ICD-10-CM | POA: Diagnosis not present

## 2018-03-16 DIAGNOSIS — N186 End stage renal disease: Secondary | ICD-10-CM | POA: Diagnosis not present

## 2018-03-16 DIAGNOSIS — D631 Anemia in chronic kidney disease: Secondary | ICD-10-CM | POA: Diagnosis not present

## 2018-03-16 DIAGNOSIS — D509 Iron deficiency anemia, unspecified: Secondary | ICD-10-CM | POA: Diagnosis not present

## 2018-03-16 DIAGNOSIS — N2581 Secondary hyperparathyroidism of renal origin: Secondary | ICD-10-CM | POA: Diagnosis not present

## 2018-03-18 DIAGNOSIS — D509 Iron deficiency anemia, unspecified: Secondary | ICD-10-CM | POA: Diagnosis not present

## 2018-03-18 DIAGNOSIS — N186 End stage renal disease: Secondary | ICD-10-CM | POA: Diagnosis not present

## 2018-03-18 DIAGNOSIS — D631 Anemia in chronic kidney disease: Secondary | ICD-10-CM | POA: Diagnosis not present

## 2018-03-18 DIAGNOSIS — N2581 Secondary hyperparathyroidism of renal origin: Secondary | ICD-10-CM | POA: Diagnosis not present

## 2018-03-21 DIAGNOSIS — D509 Iron deficiency anemia, unspecified: Secondary | ICD-10-CM | POA: Diagnosis not present

## 2018-03-21 DIAGNOSIS — N2581 Secondary hyperparathyroidism of renal origin: Secondary | ICD-10-CM | POA: Diagnosis not present

## 2018-03-21 DIAGNOSIS — N186 End stage renal disease: Secondary | ICD-10-CM | POA: Diagnosis not present

## 2018-03-21 DIAGNOSIS — D631 Anemia in chronic kidney disease: Secondary | ICD-10-CM | POA: Diagnosis not present

## 2018-03-23 DIAGNOSIS — D509 Iron deficiency anemia, unspecified: Secondary | ICD-10-CM | POA: Diagnosis not present

## 2018-03-23 DIAGNOSIS — N2581 Secondary hyperparathyroidism of renal origin: Secondary | ICD-10-CM | POA: Diagnosis not present

## 2018-03-23 DIAGNOSIS — D631 Anemia in chronic kidney disease: Secondary | ICD-10-CM | POA: Diagnosis not present

## 2018-03-23 DIAGNOSIS — N186 End stage renal disease: Secondary | ICD-10-CM | POA: Diagnosis not present

## 2018-03-24 ENCOUNTER — Ambulatory Visit: Payer: Medicare Other

## 2018-03-24 ENCOUNTER — Encounter: Payer: Self-pay | Admitting: Internal Medicine

## 2018-03-25 DIAGNOSIS — D631 Anemia in chronic kidney disease: Secondary | ICD-10-CM | POA: Diagnosis not present

## 2018-03-25 DIAGNOSIS — N186 End stage renal disease: Secondary | ICD-10-CM | POA: Diagnosis not present

## 2018-03-25 DIAGNOSIS — N2581 Secondary hyperparathyroidism of renal origin: Secondary | ICD-10-CM | POA: Diagnosis not present

## 2018-03-25 DIAGNOSIS — D509 Iron deficiency anemia, unspecified: Secondary | ICD-10-CM | POA: Diagnosis not present

## 2018-03-27 DIAGNOSIS — D631 Anemia in chronic kidney disease: Secondary | ICD-10-CM | POA: Diagnosis not present

## 2018-03-27 DIAGNOSIS — N2581 Secondary hyperparathyroidism of renal origin: Secondary | ICD-10-CM | POA: Diagnosis not present

## 2018-03-27 DIAGNOSIS — N186 End stage renal disease: Secondary | ICD-10-CM | POA: Diagnosis not present

## 2018-03-27 DIAGNOSIS — D509 Iron deficiency anemia, unspecified: Secondary | ICD-10-CM | POA: Diagnosis not present

## 2018-03-29 DIAGNOSIS — N186 End stage renal disease: Secondary | ICD-10-CM | POA: Diagnosis not present

## 2018-03-29 DIAGNOSIS — D509 Iron deficiency anemia, unspecified: Secondary | ICD-10-CM | POA: Diagnosis not present

## 2018-03-29 DIAGNOSIS — D631 Anemia in chronic kidney disease: Secondary | ICD-10-CM | POA: Diagnosis not present

## 2018-03-29 DIAGNOSIS — N2581 Secondary hyperparathyroidism of renal origin: Secondary | ICD-10-CM | POA: Diagnosis not present

## 2018-04-01 DIAGNOSIS — D631 Anemia in chronic kidney disease: Secondary | ICD-10-CM | POA: Diagnosis not present

## 2018-04-01 DIAGNOSIS — N2581 Secondary hyperparathyroidism of renal origin: Secondary | ICD-10-CM | POA: Diagnosis not present

## 2018-04-01 DIAGNOSIS — N186 End stage renal disease: Secondary | ICD-10-CM | POA: Diagnosis not present

## 2018-04-01 DIAGNOSIS — D509 Iron deficiency anemia, unspecified: Secondary | ICD-10-CM | POA: Diagnosis not present

## 2018-04-03 DIAGNOSIS — I129 Hypertensive chronic kidney disease with stage 1 through stage 4 chronic kidney disease, or unspecified chronic kidney disease: Secondary | ICD-10-CM | POA: Diagnosis not present

## 2018-04-03 DIAGNOSIS — Z992 Dependence on renal dialysis: Secondary | ICD-10-CM | POA: Diagnosis not present

## 2018-04-03 DIAGNOSIS — N186 End stage renal disease: Secondary | ICD-10-CM | POA: Diagnosis not present

## 2018-04-04 DIAGNOSIS — D631 Anemia in chronic kidney disease: Secondary | ICD-10-CM | POA: Diagnosis not present

## 2018-04-04 DIAGNOSIS — N2581 Secondary hyperparathyroidism of renal origin: Secondary | ICD-10-CM | POA: Diagnosis not present

## 2018-04-04 DIAGNOSIS — N186 End stage renal disease: Secondary | ICD-10-CM | POA: Diagnosis not present

## 2018-04-06 DIAGNOSIS — N2581 Secondary hyperparathyroidism of renal origin: Secondary | ICD-10-CM | POA: Diagnosis not present

## 2018-04-06 DIAGNOSIS — N186 End stage renal disease: Secondary | ICD-10-CM | POA: Diagnosis not present

## 2018-04-06 DIAGNOSIS — D631 Anemia in chronic kidney disease: Secondary | ICD-10-CM | POA: Diagnosis not present

## 2018-04-08 DIAGNOSIS — N2581 Secondary hyperparathyroidism of renal origin: Secondary | ICD-10-CM | POA: Diagnosis not present

## 2018-04-08 DIAGNOSIS — D631 Anemia in chronic kidney disease: Secondary | ICD-10-CM | POA: Diagnosis not present

## 2018-04-08 DIAGNOSIS — N186 End stage renal disease: Secondary | ICD-10-CM | POA: Diagnosis not present

## 2018-04-11 DIAGNOSIS — D631 Anemia in chronic kidney disease: Secondary | ICD-10-CM | POA: Diagnosis not present

## 2018-04-11 DIAGNOSIS — N2581 Secondary hyperparathyroidism of renal origin: Secondary | ICD-10-CM | POA: Diagnosis not present

## 2018-04-11 DIAGNOSIS — N186 End stage renal disease: Secondary | ICD-10-CM | POA: Diagnosis not present

## 2018-04-13 DIAGNOSIS — N186 End stage renal disease: Secondary | ICD-10-CM | POA: Diagnosis not present

## 2018-04-13 DIAGNOSIS — D631 Anemia in chronic kidney disease: Secondary | ICD-10-CM | POA: Diagnosis not present

## 2018-04-13 DIAGNOSIS — N2581 Secondary hyperparathyroidism of renal origin: Secondary | ICD-10-CM | POA: Diagnosis not present

## 2018-04-15 DIAGNOSIS — D631 Anemia in chronic kidney disease: Secondary | ICD-10-CM | POA: Diagnosis not present

## 2018-04-15 DIAGNOSIS — N186 End stage renal disease: Secondary | ICD-10-CM | POA: Diagnosis not present

## 2018-04-15 DIAGNOSIS — N2581 Secondary hyperparathyroidism of renal origin: Secondary | ICD-10-CM | POA: Diagnosis not present

## 2018-04-18 DIAGNOSIS — D631 Anemia in chronic kidney disease: Secondary | ICD-10-CM | POA: Diagnosis not present

## 2018-04-18 DIAGNOSIS — N2581 Secondary hyperparathyroidism of renal origin: Secondary | ICD-10-CM | POA: Diagnosis not present

## 2018-04-18 DIAGNOSIS — N186 End stage renal disease: Secondary | ICD-10-CM | POA: Diagnosis not present

## 2018-04-20 DIAGNOSIS — N2581 Secondary hyperparathyroidism of renal origin: Secondary | ICD-10-CM | POA: Diagnosis not present

## 2018-04-20 DIAGNOSIS — N186 End stage renal disease: Secondary | ICD-10-CM | POA: Diagnosis not present

## 2018-04-20 DIAGNOSIS — D631 Anemia in chronic kidney disease: Secondary | ICD-10-CM | POA: Diagnosis not present

## 2018-04-22 DIAGNOSIS — N2581 Secondary hyperparathyroidism of renal origin: Secondary | ICD-10-CM | POA: Diagnosis not present

## 2018-04-22 DIAGNOSIS — D631 Anemia in chronic kidney disease: Secondary | ICD-10-CM | POA: Diagnosis not present

## 2018-04-22 DIAGNOSIS — N186 End stage renal disease: Secondary | ICD-10-CM | POA: Diagnosis not present

## 2018-04-26 DIAGNOSIS — N2581 Secondary hyperparathyroidism of renal origin: Secondary | ICD-10-CM | POA: Diagnosis not present

## 2018-04-26 DIAGNOSIS — N186 End stage renal disease: Secondary | ICD-10-CM | POA: Diagnosis not present

## 2018-04-26 DIAGNOSIS — D631 Anemia in chronic kidney disease: Secondary | ICD-10-CM | POA: Diagnosis not present

## 2018-04-29 DIAGNOSIS — N2581 Secondary hyperparathyroidism of renal origin: Secondary | ICD-10-CM | POA: Diagnosis not present

## 2018-04-29 DIAGNOSIS — D631 Anemia in chronic kidney disease: Secondary | ICD-10-CM | POA: Diagnosis not present

## 2018-04-29 DIAGNOSIS — N186 End stage renal disease: Secondary | ICD-10-CM | POA: Diagnosis not present

## 2018-05-01 DIAGNOSIS — D631 Anemia in chronic kidney disease: Secondary | ICD-10-CM | POA: Diagnosis not present

## 2018-05-01 DIAGNOSIS — N186 End stage renal disease: Secondary | ICD-10-CM | POA: Diagnosis not present

## 2018-05-01 DIAGNOSIS — N2581 Secondary hyperparathyroidism of renal origin: Secondary | ICD-10-CM | POA: Diagnosis not present

## 2018-05-03 DIAGNOSIS — N186 End stage renal disease: Secondary | ICD-10-CM | POA: Diagnosis not present

## 2018-05-03 DIAGNOSIS — N2581 Secondary hyperparathyroidism of renal origin: Secondary | ICD-10-CM | POA: Diagnosis not present

## 2018-05-03 DIAGNOSIS — D631 Anemia in chronic kidney disease: Secondary | ICD-10-CM | POA: Diagnosis not present

## 2018-05-04 DIAGNOSIS — I129 Hypertensive chronic kidney disease with stage 1 through stage 4 chronic kidney disease, or unspecified chronic kidney disease: Secondary | ICD-10-CM | POA: Diagnosis not present

## 2018-05-04 DIAGNOSIS — Z992 Dependence on renal dialysis: Secondary | ICD-10-CM | POA: Diagnosis not present

## 2018-05-04 DIAGNOSIS — N186 End stage renal disease: Secondary | ICD-10-CM | POA: Diagnosis not present

## 2018-05-06 DIAGNOSIS — D631 Anemia in chronic kidney disease: Secondary | ICD-10-CM | POA: Diagnosis not present

## 2018-05-06 DIAGNOSIS — N186 End stage renal disease: Secondary | ICD-10-CM | POA: Diagnosis not present

## 2018-05-06 DIAGNOSIS — N2581 Secondary hyperparathyroidism of renal origin: Secondary | ICD-10-CM | POA: Diagnosis not present

## 2018-05-09 DIAGNOSIS — N2581 Secondary hyperparathyroidism of renal origin: Secondary | ICD-10-CM | POA: Diagnosis not present

## 2018-05-09 DIAGNOSIS — D631 Anemia in chronic kidney disease: Secondary | ICD-10-CM | POA: Diagnosis not present

## 2018-05-09 DIAGNOSIS — N186 End stage renal disease: Secondary | ICD-10-CM | POA: Diagnosis not present

## 2018-05-11 DIAGNOSIS — N186 End stage renal disease: Secondary | ICD-10-CM | POA: Diagnosis not present

## 2018-05-11 DIAGNOSIS — D631 Anemia in chronic kidney disease: Secondary | ICD-10-CM | POA: Diagnosis not present

## 2018-05-11 DIAGNOSIS — N2581 Secondary hyperparathyroidism of renal origin: Secondary | ICD-10-CM | POA: Diagnosis not present

## 2018-05-13 DIAGNOSIS — N186 End stage renal disease: Secondary | ICD-10-CM | POA: Diagnosis not present

## 2018-05-13 DIAGNOSIS — D631 Anemia in chronic kidney disease: Secondary | ICD-10-CM | POA: Diagnosis not present

## 2018-05-13 DIAGNOSIS — N2581 Secondary hyperparathyroidism of renal origin: Secondary | ICD-10-CM | POA: Diagnosis not present

## 2018-05-16 DIAGNOSIS — N186 End stage renal disease: Secondary | ICD-10-CM | POA: Diagnosis not present

## 2018-05-16 DIAGNOSIS — N2581 Secondary hyperparathyroidism of renal origin: Secondary | ICD-10-CM | POA: Diagnosis not present

## 2018-05-16 DIAGNOSIS — D631 Anemia in chronic kidney disease: Secondary | ICD-10-CM | POA: Diagnosis not present

## 2018-05-18 DIAGNOSIS — N186 End stage renal disease: Secondary | ICD-10-CM | POA: Diagnosis not present

## 2018-05-18 DIAGNOSIS — N2581 Secondary hyperparathyroidism of renal origin: Secondary | ICD-10-CM | POA: Diagnosis not present

## 2018-05-18 DIAGNOSIS — D631 Anemia in chronic kidney disease: Secondary | ICD-10-CM | POA: Diagnosis not present

## 2018-05-20 ENCOUNTER — Emergency Department (HOSPITAL_COMMUNITY)
Admission: EM | Admit: 2018-05-20 | Discharge: 2018-05-20 | Disposition: A | Discharge status: Home / Self Care | Payer: Medicare Other | Attending: Emergency Medicine | Admitting: Emergency Medicine

## 2018-05-20 ENCOUNTER — Encounter (HOSPITAL_COMMUNITY): Payer: Self-pay | Admitting: *Deleted

## 2018-05-20 ENCOUNTER — Other Ambulatory Visit: Payer: Self-pay

## 2018-05-20 DIAGNOSIS — D631 Anemia in chronic kidney disease: Secondary | ICD-10-CM | POA: Diagnosis not present

## 2018-05-20 DIAGNOSIS — N2581 Secondary hyperparathyroidism of renal origin: Secondary | ICD-10-CM | POA: Diagnosis not present

## 2018-05-20 DIAGNOSIS — N185 Chronic kidney disease, stage 5: Secondary | ICD-10-CM | POA: Diagnosis not present

## 2018-05-20 DIAGNOSIS — N186 End stage renal disease: Secondary | ICD-10-CM | POA: Diagnosis not present

## 2018-05-20 DIAGNOSIS — R569 Unspecified convulsions: Secondary | ICD-10-CM | POA: Diagnosis not present

## 2018-05-20 DIAGNOSIS — Z992 Dependence on renal dialysis: Secondary | ICD-10-CM

## 2018-05-20 DIAGNOSIS — G40909 Epilepsy, unspecified, not intractable, without status epilepticus: Secondary | ICD-10-CM | POA: Diagnosis present

## 2018-05-20 NOTE — ED Notes (Signed)
Pt does not want to stay.  Able to ambulate without dizziness.

## 2018-05-20 NOTE — ED Provider Notes (Signed)
Pt presented to the ED for evaluation of possible seizure activity.  According to the note that comes with the patient the patient had 3 episodes of bilateral upper body activity.  The note indicates no shaking but it does not define what he did in his upper body.  Apparently he was staring but did not have any incontinence and there was some slight confusion for a couple of minutes.  This occurred at the end of his dialysis session.  Patient right now states he feels fine.  He does not think that he was staring.  He denies having any loss of consciousness.  Patient states he does not want to be here and wants to leave to get something to eat.  Not interested in having an evaluation in the ED. Physical Exam  Temp 97.7 F (36.5 C) (Oral)   Ht 1.575 m (5\' 2" )   Wt 79.4 kg   BMI 32.01 kg/m   Physical Exam Vitals signs and nursing note reviewed.  Constitutional:      General: He is not in acute distress.    Appearance: He is well-developed.  HENT:     Head: Normocephalic and atraumatic.     Right Ear: External ear normal.     Left Ear: External ear normal.  Eyes:     General: No scleral icterus.       Right eye: No discharge.        Left eye: No discharge.     Conjunctiva/sclera: Conjunctivae normal.  Neck:     Musculoskeletal: Neck supple.     Trachea: No tracheal deviation.  Cardiovascular:     Rate and Rhythm: Normal rate.  Pulmonary:     Effort: Pulmonary effort is normal. No respiratory distress.     Breath sounds: No stridor.  Abdominal:     General: There is no distension.  Musculoskeletal:        General: No swelling or deformity.  Skin:    General: Skin is warm and dry.     Findings: No rash.  Neurological:     Mental Status: He is alert.     Cranial Nerves: Cranial nerve deficit: no gross deficits.     ED Course/Procedures     Procedures  EKG Interpretation  Date/Time:  Friday May 20 2018 16:48:31 EST Ventricular Rate:  62 PR Interval:    QRS  Duration: 156 QT Interval:  480 QTC Calculation: 488 R Axis:   33 Text Interpretation:  Sinus rhythm Right bundle branch block t wave abnormality resolved compared to prior tracing Confirmed by Dorie Rank [phone]) on 05/20/2018 4:51:18 PM       MDM  Patient is alert and awake.  He is aware of person place and time.  He does not appear to be confused.  Patient does not want to be evaluated and wants to leave before any further treatment and evaluation.  Patient was able to stand without difficulty.  He is able to walk out on his own without assistance.       Dorie Rank, MD 05/20/18 [phone]

## 2018-05-20 NOTE — ED Triage Notes (Signed)
Pt here from dialysis for possible seizure-like activity during dialysis (comleted all 4 hours).  Pt with several 30 sec episodes of bil, upper body movement.  Slight confusion for 2-3 sec afterwards.  No shaking, no incontinence. CBG 87, O2 99% RA, hr 80, bp 116/74.

## 2018-05-20 NOTE — Discharge Instructions (Signed)
You decided to leave the emergency room before we were able to do an evaluation.  Please return if you change your mind or start feeling poorly or develop any recurrent symptoms.

## 2018-05-23 DIAGNOSIS — N186 End stage renal disease: Secondary | ICD-10-CM | POA: Diagnosis not present

## 2018-05-23 DIAGNOSIS — N2581 Secondary hyperparathyroidism of renal origin: Secondary | ICD-10-CM | POA: Diagnosis not present

## 2018-05-23 DIAGNOSIS — D631 Anemia in chronic kidney disease: Secondary | ICD-10-CM | POA: Diagnosis not present

## 2018-05-25 DIAGNOSIS — N2581 Secondary hyperparathyroidism of renal origin: Secondary | ICD-10-CM | POA: Diagnosis not present

## 2018-05-25 DIAGNOSIS — D631 Anemia in chronic kidney disease: Secondary | ICD-10-CM | POA: Diagnosis not present

## 2018-05-25 DIAGNOSIS — N186 End stage renal disease: Secondary | ICD-10-CM | POA: Diagnosis not present

## 2018-05-27 DIAGNOSIS — N186 End stage renal disease: Secondary | ICD-10-CM | POA: Diagnosis not present

## 2018-05-27 DIAGNOSIS — N2581 Secondary hyperparathyroidism of renal origin: Secondary | ICD-10-CM | POA: Diagnosis not present

## 2018-05-27 DIAGNOSIS — D631 Anemia in chronic kidney disease: Secondary | ICD-10-CM | POA: Diagnosis not present

## 2018-05-30 DIAGNOSIS — D631 Anemia in chronic kidney disease: Secondary | ICD-10-CM | POA: Diagnosis not present

## 2018-05-30 DIAGNOSIS — N2581 Secondary hyperparathyroidism of renal origin: Secondary | ICD-10-CM | POA: Diagnosis not present

## 2018-05-30 DIAGNOSIS — N186 End stage renal disease: Secondary | ICD-10-CM | POA: Diagnosis not present

## 2018-06-01 DIAGNOSIS — N2581 Secondary hyperparathyroidism of renal origin: Secondary | ICD-10-CM | POA: Diagnosis not present

## 2018-06-01 DIAGNOSIS — D631 Anemia in chronic kidney disease: Secondary | ICD-10-CM | POA: Diagnosis not present

## 2018-06-01 DIAGNOSIS — N186 End stage renal disease: Secondary | ICD-10-CM | POA: Diagnosis not present

## 2018-06-03 DIAGNOSIS — D631 Anemia in chronic kidney disease: Secondary | ICD-10-CM | POA: Diagnosis not present

## 2018-06-03 DIAGNOSIS — N186 End stage renal disease: Secondary | ICD-10-CM | POA: Diagnosis not present

## 2018-06-03 DIAGNOSIS — N2581 Secondary hyperparathyroidism of renal origin: Secondary | ICD-10-CM | POA: Diagnosis not present

## 2018-06-04 DIAGNOSIS — Z992 Dependence on renal dialysis: Secondary | ICD-10-CM | POA: Diagnosis not present

## 2018-06-04 DIAGNOSIS — N186 End stage renal disease: Secondary | ICD-10-CM | POA: Diagnosis not present

## 2018-06-04 DIAGNOSIS — I129 Hypertensive chronic kidney disease with stage 1 through stage 4 chronic kidney disease, or unspecified chronic kidney disease: Secondary | ICD-10-CM | POA: Diagnosis not present

## 2018-06-06 ENCOUNTER — Inpatient Hospital Stay (HOSPITAL_COMMUNITY)
Admission: EM | Admit: 2018-06-06 | Discharge: 2018-06-09 | DRG: 193 | Disposition: A | Discharge status: Home / Self Care | Payer: Medicare Other | Attending: Pulmonary Disease | Admitting: Pulmonary Disease

## 2018-06-06 ENCOUNTER — Other Ambulatory Visit: Payer: Self-pay

## 2018-06-06 ENCOUNTER — Emergency Department (HOSPITAL_COMMUNITY): Payer: Medicare Other

## 2018-06-06 ENCOUNTER — Encounter (HOSPITAL_COMMUNITY): Payer: Self-pay

## 2018-06-06 DIAGNOSIS — J189 Pneumonia, unspecified organism: Secondary | ICD-10-CM | POA: Diagnosis present

## 2018-06-06 DIAGNOSIS — R945 Abnormal results of liver function studies: Secondary | ICD-10-CM

## 2018-06-06 DIAGNOSIS — I161 Hypertensive emergency: Secondary | ICD-10-CM | POA: Diagnosis present

## 2018-06-06 DIAGNOSIS — B349 Viral infection, unspecified: Secondary | ICD-10-CM | POA: Diagnosis present

## 2018-06-06 DIAGNOSIS — J181 Lobar pneumonia, unspecified organism: Secondary | ICD-10-CM | POA: Diagnosis not present

## 2018-06-06 DIAGNOSIS — I4510 Unspecified right bundle-branch block: Secondary | ICD-10-CM | POA: Diagnosis not present

## 2018-06-06 DIAGNOSIS — Z841 Family history of disorders of kidney and ureter: Secondary | ICD-10-CM

## 2018-06-06 DIAGNOSIS — Z79899 Other long term (current) drug therapy: Secondary | ICD-10-CM | POA: Diagnosis not present

## 2018-06-06 DIAGNOSIS — Z992 Dependence on renal dialysis: Secondary | ICD-10-CM

## 2018-06-06 DIAGNOSIS — R6520 Severe sepsis without septic shock: Secondary | ICD-10-CM

## 2018-06-06 DIAGNOSIS — N186 End stage renal disease: Secondary | ICD-10-CM | POA: Diagnosis present

## 2018-06-06 DIAGNOSIS — J9600 Acute respiratory failure, unspecified whether with hypoxia or hypercapnia: Secondary | ICD-10-CM | POA: Diagnosis not present

## 2018-06-06 DIAGNOSIS — I43 Cardiomyopathy in diseases classified elsewhere: Secondary | ICD-10-CM | POA: Diagnosis present

## 2018-06-06 DIAGNOSIS — Z87891 Personal history of nicotine dependence: Secondary | ICD-10-CM

## 2018-06-06 DIAGNOSIS — I132 Hypertensive heart and chronic kidney disease with heart failure and with stage 5 chronic kidney disease, or end stage renal disease: Secondary | ICD-10-CM | POA: Diagnosis present

## 2018-06-06 DIAGNOSIS — E873 Alkalosis: Secondary | ICD-10-CM

## 2018-06-06 DIAGNOSIS — E874 Mixed disorder of acid-base balance: Secondary | ICD-10-CM | POA: Diagnosis present

## 2018-06-06 DIAGNOSIS — R079 Chest pain, unspecified: Secondary | ICD-10-CM | POA: Diagnosis not present

## 2018-06-06 DIAGNOSIS — Y95 Nosocomial condition: Secondary | ICD-10-CM | POA: Diagnosis present

## 2018-06-06 DIAGNOSIS — I509 Heart failure, unspecified: Secondary | ICD-10-CM | POA: Diagnosis present

## 2018-06-06 DIAGNOSIS — J9601 Acute respiratory failure with hypoxia: Secondary | ICD-10-CM | POA: Diagnosis present

## 2018-06-06 DIAGNOSIS — D631 Anemia in chronic kidney disease: Secondary | ICD-10-CM | POA: Diagnosis present

## 2018-06-06 DIAGNOSIS — A419 Sepsis, unspecified organism: Secondary | ICD-10-CM

## 2018-06-06 DIAGNOSIS — N2581 Secondary hyperparathyroidism of renal origin: Secondary | ICD-10-CM | POA: Diagnosis present

## 2018-06-06 DIAGNOSIS — I82409 Acute embolism and thrombosis of unspecified deep veins of unspecified lower extremity: Secondary | ICD-10-CM | POA: Diagnosis not present

## 2018-06-06 DIAGNOSIS — E875 Hyperkalemia: Secondary | ICD-10-CM | POA: Diagnosis present

## 2018-06-06 DIAGNOSIS — Z9115 Patient's noncompliance with renal dialysis: Secondary | ICD-10-CM

## 2018-06-06 DIAGNOSIS — R0789 Other chest pain: Secondary | ICD-10-CM | POA: Diagnosis not present

## 2018-06-06 DIAGNOSIS — I120 Hypertensive chronic kidney disease with stage 5 chronic kidney disease or end stage renal disease: Secondary | ICD-10-CM | POA: Diagnosis not present

## 2018-06-06 DIAGNOSIS — R05 Cough: Secondary | ICD-10-CM | POA: Diagnosis not present

## 2018-06-06 DIAGNOSIS — R7989 Other specified abnormal findings of blood chemistry: Secondary | ICD-10-CM

## 2018-06-06 DIAGNOSIS — R0902 Hypoxemia: Secondary | ICD-10-CM

## 2018-06-06 DIAGNOSIS — R0602 Shortness of breath: Secondary | ICD-10-CM | POA: Diagnosis not present

## 2018-06-06 DIAGNOSIS — D509 Iron deficiency anemia, unspecified: Secondary | ICD-10-CM | POA: Diagnosis not present

## 2018-06-06 LAB — CBC WITH DIFFERENTIAL/PLATELET
ABS IMMATURE GRANULOCYTES: 0.11 10*3/uL — AB (ref 0.00–0.07)
BASOS PCT: 0 %
Basophils Absolute: 0 10*3/uL (ref 0.0–0.1)
Eosinophils Absolute: 0 10*3/uL (ref 0.0–0.5)
Eosinophils Relative: 0 %
HEMATOCRIT: 28.3 % — AB (ref 39.0–52.0)
HEMOGLOBIN: 8.1 g/dL — AB (ref 13.0–17.0)
IMMATURE GRANULOCYTES: 1 %
LYMPHS ABS: 0.6 10*3/uL — AB (ref 0.7–4.0)
Lymphocytes Relative: 4 %
MCH: 23.9 pg — ABNORMAL LOW (ref 26.0–34.0)
MCHC: 28.6 g/dL — AB (ref 30.0–36.0)
MCV: 83.5 fL (ref 80.0–100.0)
MONO ABS: 0.7 10*3/uL (ref 0.1–1.0)
MONOS PCT: 5 %
NEUTROS ABS: 12.3 10*3/uL — AB (ref 1.7–7.7)
NEUTROS PCT: 90 %
Platelets: 295 10*3/uL (ref 150–400)
RBC: 3.39 MIL/uL — ABNORMAL LOW (ref 4.22–5.81)
RDW: 16.7 % — ABNORMAL HIGH (ref 11.5–15.5)
WBC: 13.7 10*3/uL — ABNORMAL HIGH (ref 4.0–10.5)
nRBC: 0.4 % — ABNORMAL HIGH (ref 0.0–0.2)

## 2018-06-06 LAB — CBC
HEMATOCRIT: 25.4 % — AB (ref 39.0–52.0)
Hemoglobin: 7.9 g/dL — ABNORMAL LOW (ref 13.0–17.0)
MCH: 25.4 pg — ABNORMAL LOW (ref 26.0–34.0)
MCHC: 31.1 g/dL (ref 30.0–36.0)
MCV: 81.7 fL (ref 80.0–100.0)
Platelets: 247 10*3/uL (ref 150–400)
RBC: 3.11 MIL/uL — ABNORMAL LOW (ref 4.22–5.81)
RDW: 16.7 % — ABNORMAL HIGH (ref 11.5–15.5)
WBC: 17 10*3/uL — ABNORMAL HIGH (ref 4.0–10.5)
nRBC: 0.6 % — ABNORMAL HIGH (ref 0.0–0.2)

## 2018-06-06 LAB — CREATININE, SERUM
Creatinine, Ser: 11.45 mg/dL — ABNORMAL HIGH (ref 0.61–1.24)
GFR calc Af Amer: 6 mL/min — ABNORMAL LOW (ref >60)
GFR calc non Af Amer: 5 mL/min — ABNORMAL LOW (ref >60)

## 2018-06-06 LAB — POCT I-STAT 7, (LYTES, BLD GAS, ICA,H+H)
ACID-BASE EXCESS: 8 mmol/L — AB (ref 0.0–2.0)
Bicarbonate: 29.1 mmol/L — ABNORMAL HIGH (ref 20.0–28.0)
Calcium, Ion: 0.88 mmol/L — CL (ref 1.15–1.40)
HCT: 28 % — ABNORMAL LOW (ref 39.0–52.0)
Hemoglobin: 9.5 g/dL — ABNORMAL LOW (ref 13.0–17.0)
O2 Saturation: 93 %
Patient temperature: 98.6
Potassium: 4.8 mmol/L (ref 3.5–5.1)
Sodium: 132 mmol/L — ABNORMAL LOW (ref 135–145)
TCO2: 30 mmol/L (ref 22–32)
pCO2 arterial: 28.3 mmHg — ABNORMAL LOW (ref 32.0–48.0)
pH, Arterial: 7.62 (ref 7.350–7.450)
pO2, Arterial: 54 mmHg — ABNORMAL LOW (ref 83.0–108.0)

## 2018-06-06 LAB — TYPE AND SCREEN
ABO/RH(D): A NEG
ANTIBODY SCREEN: NEGATIVE

## 2018-06-06 LAB — COMPREHENSIVE METABOLIC PANEL
ALBUMIN: 4.1 g/dL (ref 3.5–5.0)
ALK PHOS: 138 U/L — AB (ref 38–126)
ALT: 489 U/L — ABNORMAL HIGH (ref 0–44)
AST: 371 U/L — AB (ref 15–41)
Anion gap: 23 — ABNORMAL HIGH (ref 5–15)
BILIRUBIN TOTAL: 1.8 mg/dL — AB (ref 0.3–1.2)
BUN: 45 mg/dL — AB (ref 6–20)
CALCIUM: 8.8 mg/dL — AB (ref 8.9–10.3)
CO2: 25 mmol/L (ref 22–32)
Chloride: 90 mmol/L — ABNORMAL LOW (ref 98–111)
Creatinine, Ser: 10.19 mg/dL — ABNORMAL HIGH (ref 0.61–1.24)
GFR calc Af Amer: 6 mL/min — ABNORMAL LOW (ref >60)
GFR calc non Af Amer: 5 mL/min — ABNORMAL LOW (ref >60)
GLUCOSE: 105 mg/dL — AB (ref 70–99)
POTASSIUM: 5.2 mmol/L — AB (ref 3.5–5.1)
SODIUM: 138 mmol/L (ref 135–145)
TOTAL PROTEIN: 8.7 g/dL — AB (ref 6.5–8.1)

## 2018-06-06 LAB — I-STAT TROPONIN, ED: Troponin i, poc: 0.19 ng/mL (ref 0.00–0.08)

## 2018-06-06 LAB — SALICYLATE LEVEL: Salicylate Lvl: <7 mg/dL (ref 2.8–30.0)

## 2018-06-06 LAB — LACTIC ACID, PLASMA
Lactic Acid, Venous: 4.7 mmol/L (ref 0.5–1.9)
Lactic Acid, Venous: 4.1 mmol/L (ref 0.5–1.9)

## 2018-06-06 LAB — PROTIME-INR
INR: 1.17
PROTHROMBIN TIME: 14.8 s (ref 11.4–15.2)

## 2018-06-06 LAB — BRAIN NATRIURETIC PEPTIDE: B Natriuretic Peptide: >4500 pg/mL — ABNORMAL HIGH (ref 0.0–100.0)

## 2018-06-06 LAB — INFLUENZA PANEL BY PCR (TYPE A & B)
Influenza A By PCR: NEGATIVE
Influenza B By PCR: NEGATIVE

## 2018-06-06 LAB — ABO/RH: ABO/RH(D): A NEG

## 2018-06-06 LAB — D-DIMER, QUANTITATIVE: D-Dimer, Quant: 2.15 ug/mL-FEU — ABNORMAL HIGH (ref 0.00–0.50)

## 2018-06-06 LAB — TROPONIN I: TROPONIN I: 0.25 ng/mL — AB (ref <0.03)

## 2018-06-06 MED ORDER — SODIUM CHLORIDE 0.9 % IV SOLN: 100.0000 mL | INTRAVENOUS | Status: DC | PRN

## 2018-06-06 MED ORDER — VANCOMYCIN HCL 10 G IV SOLR
1750.0000 mg | Freq: Once | INTRAVENOUS | Status: AC
  Administered 2018-06-06: 1750 mg via INTRAVENOUS
  Filled 2018-06-06: qty 1750

## 2018-06-06 MED ORDER — CHLORHEXIDINE GLUCONATE CLOTH 2 % EX PADS
6.0000 | MEDICATED_PAD | Freq: Every day | CUTANEOUS | Status: DC
  Administered 2018-06-08 – 2018-06-09 (×2): 6 via TOPICAL

## 2018-06-06 MED ORDER — HEPARIN SODIUM (PORCINE) 1000 UNIT/ML DIALYSIS: 1000.0000 [IU] | INTRAMUSCULAR | Status: DC | PRN

## 2018-06-06 MED ORDER — SODIUM CHLORIDE 0.9 % IV BOLUS
500.0000 mL | Freq: Once | INTRAVENOUS | Status: AC
  Administered 2018-06-06: 500 mL via INTRAVENOUS

## 2018-06-06 MED ORDER — ASPIRIN 300 MG RE SUPP
300.0000 mg | RECTAL | Status: AC
  Administered 2018-06-06: 300 mg via RECTAL
  Filled 2018-06-06: qty 1

## 2018-06-06 MED ORDER — ACETAMINOPHEN 500 MG PO TABS: 1000.0000 mg | ORAL_TABLET | Freq: Once | ORAL | Status: DC

## 2018-06-06 MED ORDER — SODIUM CHLORIDE 0.9 % IV SOLN
1.0000 g | INTRAVENOUS | Status: AC
  Administered 2018-06-07: 1 g via INTRAVENOUS
  Filled 2018-06-06: qty 1

## 2018-06-06 MED ORDER — HEPARIN SODIUM (PORCINE) 5000 UNIT/ML IJ SOLN: 5000.0000 [IU] | Freq: Three times a day (TID) | INTRAMUSCULAR | Status: DC

## 2018-06-06 MED ORDER — LIDOCAINE-PRILOCAINE 2.5-2.5 % EX CREA: 1.0000 "application " | TOPICAL_CREAM | CUTANEOUS | Status: DC | PRN

## 2018-06-06 MED ORDER — ASPIRIN 81 MG PO CHEW: 324.0000 mg | CHEWABLE_TABLET | ORAL | Status: AC

## 2018-06-06 MED ORDER — HEPARIN BOLUS VIA INFUSION
3000.0000 [IU] | Freq: Once | INTRAVENOUS | Status: AC
  Administered 2018-06-06: 3000 [IU] via INTRAVENOUS
  Filled 2018-06-06: qty 3000

## 2018-06-06 MED ORDER — LABETALOL HCL 5 MG/ML IV SOLN: 10.0000 mg | Freq: Once | INTRAVENOUS | Status: DC

## 2018-06-06 MED ORDER — SODIUM CHLORIDE 0.9 % IV SOLN
2.0000 g | Freq: Once | INTRAVENOUS | Status: AC
  Administered 2018-06-06: 2 g via INTRAVENOUS
  Filled 2018-06-06: qty 2

## 2018-06-06 MED ORDER — ALTEPLASE 2 MG IJ SOLR: 2.0000 mg | Freq: Once | INTRAMUSCULAR | Status: DC | PRN

## 2018-06-06 MED ORDER — LIDOCAINE HCL (PF) 1 % IJ SOLN: 5.0000 mL | INTRAMUSCULAR | Status: DC | PRN

## 2018-06-06 MED ORDER — PENTAFLUOROPROP-TETRAFLUOROETH EX AERO: 1.0000 "application " | INHALATION_SPRAY | CUTANEOUS | Status: DC | PRN

## 2018-06-06 MED ORDER — DEXTROSE-NACL 5-0.45 % IV SOLN
INTRAVENOUS | Status: DC
  Administered 2018-06-06: 50 mL/h via INTRAVENOUS

## 2018-06-06 MED ORDER — MORPHINE SULFATE (PF) 2 MG/ML IV SOLN
2.0000 mg | Freq: Once | INTRAVENOUS | Status: AC
  Administered 2018-06-06: 2 mg via INTRAVENOUS
  Filled 2018-06-06: qty 1

## 2018-06-06 MED ORDER — VANCOMYCIN HCL IN DEXTROSE 1-5 GM/200ML-% IV SOLN: 1000.0000 mg | Freq: Once | INTRAVENOUS | Status: DC

## 2018-06-06 MED ORDER — HEPARIN (PORCINE) 25000 UT/250ML-% IV SOLN
1200.0000 [IU]/h | INTRAVENOUS | Status: DC
  Administered 2018-06-06: 1250 [IU]/h via INTRAVENOUS
  Administered 2018-06-07 – 2018-06-08 (×2): 1200 [IU]/h via INTRAVENOUS
  Filled 2018-06-06 (×3): qty 250

## 2018-06-06 NOTE — ED Notes (Signed)
MD aware of labs

## 2018-06-06 NOTE — Progress Notes (Signed)
ANTICOAGULATION CONSULT NOTE - Initial Consult  Pharmacy Consult for heparin Indication: VTE treatment  Allergies  Allergen Reactions  . No Known Allergies     Patient Measurements: Height: 5\' 4"  (162.6 cm) Weight: 170 lb (77.1 kg) IBW/kg (Calculated) : 59.2 Heparin Dosing Weight: 75 kg  Vital Signs: Temp: 102 F (38.9 C) (02/03 1753) Temp Source: Rectal (02/03 1753) BP: 150/103 (02/03 1845) Pulse Rate: 114 (02/03 1845)  Labs: Recent Labs    06/06/18 1445 06/06/18 1604  HGB 8.1* 9.5*  HCT 28.3* 28.0*  PLT 295  --   LABPROT 14.8  --   INR 1.17  --   CREATININE 10.19*  --     Estimated Creatinine Clearance: 8.6 mL/min (A) (by C-G formula based on SCr of 10.19 mg/dL (H)).   Medical History: Past Medical History:  Diagnosis Date  . CHF (congestive heart failure) ([place])   . Hypertension   . Meralgia paraesthetica, right 02/11/2017  . Renal disorder    dialysis MWF    Assessment: 48 year old man with ESRD on dialysis, admitted from dialysis with flulike illness and respiratory distress requiring BiPAP. Patient not on anticoagulation prior to admission. MD awaiting D-dimer and duplex. Pharmacy consulted to start heparin for VTE treatment.   Goal of Therapy:  Heparin level 0.3-0.7 units/ml Monitor platelets by anticoagulation protocol: Yes   Plan:  Give 3000 units bolus x 1 Start heparin infusion at 1250 units/hr Check anti-Xa level in 8 hours and daily while on heparin Continue to monitor H&H and platelets   Thank you for allowing [place] to participate in this patients care.   Jens Som, PharmD Please utilize [facility] (under [place]) for appropriate number for your unit pharmacist. 06/06/2018 7:27 PM

## 2018-06-06 NOTE — Consult Note (Signed)
Reason for Consult: To manage dialysis and dialysis related needs Referring Physician: Dr Tresa Endo is an 48 y.o. male.  HPI: Pt is a 30M with PMH of ESRD on HD MWF [facility] who presented to the ED with respiratory distress.  Pt had 2 hr 27 min of dialysis today and was noted to be increasingly short of breath.  He presented to the ED for evaluation.  Had a low-grade fever prior to admission as OP in dialysis about a week ago.  CXR was notable for LLL infiltrate and he was started on empiric coverage for HCAP with vanc/ cefepime.  Due to degree of hypoxemia, he was placed on BiPaP and is being empirically treated for PE with heparin gtt  D dimer elevated.  Other studies pending.   On evaluation, was generally uncooperative with exam.  Was unhappy with BiPaP and tried to discuss reasons for keeping it on, but became agitated and pulled it off his face, citing an overwhelming need for ice.    Review of OP dialysis records notes known early sign offs with high pre-HD Bps .  Left at 85.9 kg.    Dialyzes at [facility] 4 hrs  EDW 85.5 kg   Past Medical History:  Diagnosis Date  . CHF (congestive heart failure) ([facility])   . Hypertension   . Meralgia paraesthetica, right 02/11/2017  . Renal disorder    dialysis MWF    Past Surgical History:  Procedure Laterality Date  . AV FISTULA PLACEMENT Left 04/07/2016   Procedure: Left  arm Brachiocephalic ARTERIOVENOUS (AV) FISTULA CREATION;  Surgeon: Conrad Zeeland, MD;  Location: [facility];  Service: Vascular;  Laterality: Left;  . INSERTION OF DIALYSIS CATHETER Right 04/07/2016   Procedure: INSERTION OF DIALYSIS CATHETER;  Surgeon: Conrad , MD;  Location: [facility];  Service: Vascular;  Laterality: Right;  . NO PAST SURGERIES      Family History  Problem Relation Age of Onset  . Kidney failure Mother        was on dialysis    Social History:  reports that he has quit smoking. His smoking use included cigarettes. He has a 0.50 pack-year smoking history.  He has never used smokeless tobacco. He reports that he does not drink alcohol or use drugs.  Allergies:  Allergies  Allergen Reactions  . No Known Allergies     Medications:  Scheduled: . acetaminophen  1,000 mg Oral Once  . labetalol  10 mg Intravenous Once     Results for orders placed or performed during the hospital encounter of 06/06/18 (from the past 48 hour(s))  Comprehensive metabolic panel     Status: Abnormal   Collection Time: 06/06/18  2:45 PM  Result Value Ref Range   Sodium 138 135 - 145 mmol/L   Potassium 5.2 (H) 3.5 - 5.1 mmol/L   Chloride 90 (L) 98 - 111 mmol/L   CO2 25 22 - 32 mmol/L   Glucose, Bld 105 (H) 70 - 99 mg/dL   BUN 45 (H) 6 - 20 mg/dL   Creatinine, Ser 10.19 (H) 0.61 - 1.24 mg/dL   Calcium 8.8 (L) 8.9 - 10.3 mg/dL   Total Protein 8.7 (H) 6.5 - 8.1 g/dL   Albumin 4.1 3.5 - 5.0 g/dL   AST 371 (H) 15 - 41 U/L   ALT 489 (H) 0 - 44 U/L   Alkaline Phosphatase 138 (H) 38 - 126 U/L   Total Bilirubin 1.8 (H) 0.3 - 1.2  mg/dL   GFR calc non Af Amer 5 (L) >60 mL/min   GFR calc Af Amer 6 (L) >60 mL/min   Anion gap 23 (H) 5 - 15    Comment: Performed at [facility] [address], [place], [place] [address]  CBC with Differential     Status: Abnormal   Collection Time: 06/06/18  2:45 PM  Result Value Ref Range   WBC 13.7 (H) 4.0 - 10.5 K/uL   RBC 3.39 (L) 4.22 - 5.81 MIL/uL   Hemoglobin 8.1 (L) 13.0 - 17.0 g/dL   HCT 28.3 (L) 39.0 - 52.0 %   MCV 83.5 80.0 - 100.0 fL   MCH 23.9 (L) 26.0 - 34.0 pg   MCHC 28.6 (L) 30.0 - 36.0 g/dL   RDW 16.7 (H) 11.5 - 15.5 %   Platelets 295 150 - 400 K/uL   nRBC 0.4 (H) 0.0 - 0.2 %   Neutrophils Relative % 90 %   Neutro Abs 12.3 (H) 1.7 - 7.7 K/uL   Lymphocytes Relative 4 %   Lymphs Abs 0.6 (L) 0.7 - 4.0 K/uL   Monocytes Relative 5 %   Monocytes Absolute 0.7 0.1 - 1.0 K/uL   Eosinophils Relative 0 %   Eosinophils Absolute 0.0 0.0 - 0.5 K/uL   Basophils Relative 0 %   Basophils Absolute 0.0 0.0 - 0.1  K/uL   Immature Granulocytes 1 %   Abs Immature Granulocytes 0.11 (H) 0.00 - 0.07 K/uL   Polychromasia PRESENT    Ovalocytes PRESENT     Comment: Performed at [facility], [place] [address] [place], [place] [address]  Protime-INR     Status: None   Collection Time: 06/06/18  2:45 PM  Result Value Ref Range   Prothrombin Time 14.8 11.4 - 15.2 seconds   INR 1.17     Comment: Performed at [facility] [address], [place], [place] [address]  Brain natriuretic peptide     Status: Abnormal   Collection Time: 06/06/18  2:45 PM  Result Value Ref Range   B Natriuretic Peptide >4,500.0 (H) 0.0 - 100.0 pg/mL    Comment: Performed at [facility] [address], [place], [place] [address]  Type and screen [place]     Status: None   Collection Time: 06/06/18  3:10 PM  Result Value Ref Range   ABO/RH(D) A NEG    Antibody Screen NEG    Sample Expiration      06/09/2018 Performed at [facility], [place] [address] [place], [place] [address]   ABO/Rh     Status: None   Collection Time: 06/06/18  3:10 PM  Result Value Ref Range   ABO/RH(D)      A NEG Performed at [facility] [address] [place], [place] [address]   Lactic acid, plasma     Status: Abnormal   Collection Time: 06/06/18  3:49 PM  Result Value Ref Range   Lactic Acid, Venous 4.7 (HH) 0.5 - 1.9 mmol/L    Comment: CRITICAL RESULT CALLED TO, READ BACK BY AND VERIFIED WITH: Thurmond Butts [phone] 06/06/2018 WBOND Performed at [facility] [address], [place], [place] [address]   I-stat troponin, ED     Status: Abnormal   Collection Time: 06/06/18  3:57 PM  Result Value Ref Range   Troponin i, poc 0.19 (HH) 0.00 - 0.08 ng/mL   Comment NOTIFIED PHYSICIAN  Comment 3            Comment: Due to the release kinetics of cTnI, a negative result within the first hours of the onset of symptoms does not rule out myocardial infarction with certainty. If myocardial  infarction is still suspected, repeat the test at appropriate intervals.   I-STAT 7, (LYTES, BLD GAS, ICA, H+H)     Status: Abnormal   Collection Time: 06/06/18  4:04 PM  Result Value Ref Range   pH, Arterial 7.620 (HH) 7.350 - 7.450   pCO2 arterial 28.3 (L) 32.0 - 48.0 mmHg   pO2, Arterial 54.0 (L) 83.0 - 108.0 mmHg   Bicarbonate 29.1 (H) 20.0 - 28.0 mmol/L   TCO2 30 22 - 32 mmol/L   O2 Saturation 93.0 %   Acid-Base Excess 8.0 (H) 0.0 - 2.0 mmol/L   Sodium 132 (L) 135 - 145 mmol/L   Potassium 4.8 3.5 - 5.1 mmol/L   Calcium, Ion 0.88 (LL) 1.15 - 1.40 mmol/L   HCT 28.0 (L) 39.0 - 52.0 %   Hemoglobin 9.5 (L) 13.0 - 17.0 g/dL   Patient temperature 98.6 F    Collection site RADIAL, ALLEN'S TEST ACCEPTABLE    Sample type ARTERIAL    Comment NOTIFIED PHYSICIAN   Influenza panel by PCR (type A & B)     Status: None   Collection Time: 06/06/18  5:26 PM  Result Value Ref Range   Influenza A By PCR NEGATIVE NEGATIVE   Influenza B By PCR NEGATIVE NEGATIVE    Comment: (NOTE) The Xpert Xpress Flu assay is intended as an aid in the diagnosis of  influenza and should not be used as a sole basis for treatment.  This  assay is FDA approved for nasopharyngeal swab specimens only. Nasal  washings and aspirates are unacceptable for Xpert Xpress Flu testing. Performed at [facility], [place] [address] [place], [place] [address]   Lactic acid, plasma     Status: Abnormal   Collection Time: 06/06/18  5:37 PM  Result Value Ref Range   Lactic Acid, Venous 4.1 (HH) 0.5 - 1.9 mmol/L    Comment: CRITICAL RESULT CALLED TO, READ BACK BY AND VERIFIED WITH: Doristine Devoid [phone] 06/06/2018 WBOND Performed at [facility], [place] [address], [place], [place] [address]   Salicylate level     Status: None   Collection Time: 06/06/18  5:37 PM  Result Value Ref Range   Salicylate Lvl <5.8 2.8 - 30.0 mg/dL    Comment: Performed at [facility] [address] [place], [place] [address]  CBC      Status: Abnormal   Collection Time: 06/06/18  9:28 PM  Result Value Ref Range   WBC 17.0 (H) 4.0 - 10.5 K/uL   RBC 3.11 (L) 4.22 - 5.81 MIL/uL   Hemoglobin 7.9 (L) 13.0 - 17.0 g/dL   HCT 25.4 (L) 39.0 - 52.0 %   MCV 81.7 80.0 - 100.0 fL   MCH 25.4 (L) 26.0 - 34.0 pg   MCHC 31.1 30.0 - 36.0 g/dL   RDW 16.7 (H) 11.5 - 15.5 %   Platelets 247 150 - 400 K/uL   nRBC 0.6 (H) 0.0 - 0.2 %    Comment: Performed at [facility], [place] [address] [place], [place] [address]  D-dimer, quantitative (not at [facility])     Status: Abnormal   Collection Time: 06/06/18  9:28 PM  Result Value Ref Range  D-Dimer, Quant 2.15 (H) 0.00 - 0.50 ug/mL-FEU    Comment: (NOTE) At the manufacturer cut-off of 0.50 ug/mL FEU, this assay has been documented to exclude PE with a sensitivity and negative predictive value of 97 to 99%.  At this time, this assay has not been approved by the FDA to exclude DVT/VTE. Results should be correlated with clinical presentation. Performed at [facility], [place] [address], [place], [place] [address]     Dg Chest Port 1 View  Result Date: 06/06/2018 CLINICAL DATA:  Chest pain and shortness of breath. Chronic renal failure EXAM: PORTABLE CHEST 1 VIEW COMPARISON:  April 07, 2016 FINDINGS: There is cardiomegaly. Pulmonary vascularity is within normal limits. There is ill-defined opacity in the left mid lung. The lungs elsewhere are clear. No adenopathy evident. No bone lesions. IMPRESSION: Probable pneumonia left mid lung region. Lungs elsewhere appear clear. Stable cardiomegaly. No adenopathy evident. Followup PA and lateral chest radiographs recommended in 3-4 weeks following trial of antibiotic therapy to ensure resolution and exclude underlying malignancy. Electronically Signed   By: Lowella Grip III M.D.   On: 06/06/2018 15:15    ROS: all other systems reviewed and are negative except as per HPI Blood pressure 95/61, pulse (!) 113, temperature (!) 101.2 F (38.4  C), temperature source Axillary, resp. rate (!) 34, height 5\' 4"  (1.626 m), weight 86.4 kg, SpO2 100 %. .  GEN upset HEENT EOMI PERRL, BiPaP initially on then not NECK some JVD PULM coarse anterior- couldn't really get a good exam CV tachycardic ABD nondistended EXT no LE edema NEURO AAO x 3 SKIN no rashes  Assessment/Plan: 1 fevers/ hypoxemia: being treated empirically for HCAP with vanc/ cefepime.  Also on hep gtt for empiric rx of PE, dopplers pending.  RVP pending too.  His CXR doesn't appear particularly wet; will plan on HD for in the AM--> he signs off early regularly and his BP is uncontrolled so if he will agree to extra session tomorrow we possibly could improve his respiratory status (or at least take fluid overload off the differential).  2 ESRD: MWF schedule, will plan on extra rx tomorrow 3 Hypertension: normally uncontrolled 4. Anemia of ESRD: will dose ESA as appropriate 5. Metabolic Bone Disease: binders and vitamins as appropriate 6.  Dispo: requiring BiPaP for now   Pricsilla Lindvall, Benjamine Mola 06/06/2018, 11:01 PM

## 2018-06-06 NOTE — Progress Notes (Signed)
Pharmacy Antibiotic Note  Cameron [name] is a 48 y.o. male admitted on 06/06/2018 with sepsis.  Pharmacy has been consulted for Vancomycin and Cefepime dosing.   Patient has ESRD and was brought to the ED from dialysis with concern for sepsis. Likely source is pneumonia. Note patient has 2 hours and 27 minutes of dialysis prior to transfer.   Plan: Cefepime 2g IV x1 then will plan for Cefepime 1g IV every 24 hours until HD schedule is confirmed.  Vancomycin 1750mg  IV x1 then will follow-up HD plans to determine maintenance dosing.  Monitor HD plan, clinical status, and culture results.   Height: 5\' 4"  (162.6 cm) Weight: 170 lb (77.1 kg) IBW/kg (Calculated) : 59.2  Temp (24hrs), Avg:99.5 F (37.5 C), Min:99.5 F (37.5 C), Max:99.5 F (37.5 C)  No results for input(s): WBC, CREATININE, LATICACIDVEN, VANCOTROUGH, VANCOPEAK, VANCORANDOM, GENTTROUGH, GENTPEAK, GENTRANDOM, TOBRATROUGH, TOBRAPEAK, TOBRARND, AMIKACINPEAK, AMIKACINTROU, AMIKACIN in the last 168 hours.  CrCl cannot be calculated (Patient's most recent lab result is older than the maximum 21 days allowed.).    Allergies  Allergen Reactions  . No Known Allergies     Antimicrobials this admission: Vancomycin 2/3 >> Cefepime 2/3 >>  Dose adjustments this admission:   Microbiology results: 2/3 BCx:   Thank you for allowing pharmacy to be a part of this patient's care.  Sloan Leiter, PharmD, BCPS, BCCCP Clinical Pharmacist Please refer to [facility] for [facility] numbers 06/06/2018 3:39 PM

## 2018-06-06 NOTE — ED Notes (Addendum)
This RN paged and spoke to Dr. Elsworth Soho regarding patient's critical lactic acid of 4.1. No new orders at this time.

## 2018-06-06 NOTE — ED Triage Notes (Signed)
Pt arrives EMS from dialysis where pt had 2h 27 min of run before he began to c/o shob and chest pain. Pt had flu like symptoms over weekend and increased fluid intake.rhopnchi throughout fields.

## 2018-06-06 NOTE — ED Provider Notes (Signed)
[facility] EMERGENCY DEPARTMENT Provider Note   CSN: [id number] Arrival date & time: 06/06/18  1437     History   Chief Complaint Chief Complaint  Patient presents with  . Chest Pain  . Shortness of Breath    HPI Cameron [name] is a 48 y.o. male presenting for evaluation of SOB and CP.   Pt states for the past 3 days he has been having SOB.  At 8 AM he developed chest pain, which has been gradually worsening, especially after dialysis.  Chest pain is substernal.  Pain is constant.  Improved when laying flat, increased pain with deep respirations.  He reports generalized body aches and sweating several days ago, but none recently.  He denies cough, nasal congestion, sore throat, abdominal pain, nausea, vomiting, or diarrhea.  He does continue to produce urine.  He is not on Lasix.  He has a history of hypertension, did not take his medications today.  Patient reports a history of asthma, but does not use an inhaler, no documented history of asthma.  Additional history obtained from chart review, patient with a history of dialysis, goes a, Wednesday, Friday.  Patient states he completed most of his dialysis today, but not all.  Patient was seen on the 17th for seizure-like activity, left AMA prior to work-up.    Sob and cp.   Sob x several days and subjective fever.   Body aches and sweating.   CP today at 8 am and worsened (esp after dialysis. Cp is midternal. Improved with laying lfat.   No cough, rhinorrhea, st, abd pain, or diarrhea.   htn --> ne meds today.  Tachy  [place] has not helped with his sob or cp.   Here on the 17th for sz-like actiivty. Left ama.   Rhonchi bilat (worse on RRL). Cp not producible.  No abd pain.no edema.   H/o chf and ?asthma?  Ddx: flu, pna,   M/W/F dialysis. Full session today.    HPI  Past Medical History:  Diagnosis Date  . CHF (congestive heart failure) ([place])   . Hypertension   . Meralgia paraesthetica, right  02/11/2017  . Renal disorder    dialysis MWF    Patient Active Problem List   Diagnosis Date Noted  . Meralgia paraesthetica, right 02/11/2017  . Pulmonary hypertension ([place]) 07/28/2016  . Preventative health care 07/28/2016  . Vitamin D deficiency 06/09/2016  . Pain in both lower legs 06/04/2016  . Anemia of renal disease 04/06/2016  . Essential hypertension 04/03/2016  . Gout 04/03/2016  . ESRD needing dialysis ([place]) 03/30/2016  . Combined congestive systolic and diastolic heart failure ([place]) 01/17/2016  . Polysubstance abuse ([place])   . Tobacco abuse     Past Surgical History:  Procedure Laterality Date  . AV FISTULA PLACEMENT Left 04/07/2016   Procedure: Left  arm Brachiocephalic ARTERIOVENOUS (AV) FISTULA CREATION;  Surgeon: Conrad Versailles, MD;  Location: [facility];  Service: Vascular;  Laterality: Left;  . INSERTION OF DIALYSIS CATHETER Right 04/07/2016   Procedure: INSERTION OF DIALYSIS CATHETER;  Surgeon: Conrad Langdon, MD;  Location: [facility];  Service: Vascular;  Laterality: Right;  . NO PAST SURGERIES          Home Medications    Prior to Admission medications   Medication Sig Start Date End Date Taking? Authorizing Provider  carbamazepine (TEGRETOL) 200 MG tablet Take 1 tablet (200 mg total) by mouth 2 (two) times daily. 02/11/17  Yes Kathrynn Ducking,  MD  carvedilol (COREG) 25 MG tablet Take 1 tablet (25 mg total) by mouth 2 (two) times daily with a meal. 04/07/16  Yes Holley Raring, MD  cinacalcet (SENSIPAR) 30 MG tablet Take 2 tablets (60 mg total) by mouth daily with supper. 04/07/16  Yes Holley Raring, MD  cloNIDine (CATAPRES) 0.3 MG tablet Take 0.3 mg by mouth daily.    Yes [provider]  gabapentin (NEURONTIN) 300 MG capsule Take 1 capsule (300 mg total) by mouth at bedtime. 07/28/16 06/06/18 Yes Shela Leff, MD  hydrALAZINE (APRESOLINE) 100 MG tablet Take 1 tablet (100 mg total) by mouth 3 (three) times daily. 07/28/16  Yes Shela Leff, MD    ibuprofen (ADVIL,MOTRIN) 200 MG tablet Take 200 mg by mouth every 6 (six) hours as needed for moderate pain.    Yes [provider]  calcitRIOL (ROCALTROL) 0.5 MCG capsule Take 2 capsules (1 mcg total) by mouth every other day. Patient not taking: Reported on 06/06/2018 04/07/16   Holley Raring, MD  calcium acetate (PHOSLO) 667 MG capsule Take 3 capsules (2,001 mg total) by mouth 3 (three) times daily with meals. Patient not taking: Reported on 06/06/2018 04/07/16   Holley Raring, MD    Family History Family History  Problem Relation Age of Onset  . Kidney failure Mother        was on dialysis    Social History Social History   Tobacco Use  . Smoking status: Former Smoker    Packs/day: 0.10    Years: 5.00    Pack years: 0.50    Types: Cigarettes  . Smokeless tobacco: Never Used  . Tobacco comment: quit  smoking 20 years ago "  Substance Use Topics  . Alcohol use: No    Comment: Quit 20 years ago  . Drug use: No    Comment: Former cocaine, marijuana, heroin use 20 years ago; denies IV drug use     Allergies   No known allergies   Review of Systems Review of Systems  Constitutional: Positive for fever (Subjective).  Respiratory: Positive for shortness of breath.   Cardiovascular: Positive for chest pain.  All other systems reviewed and are negative.    Physical Exam Updated Vital Signs BP (!) 180/111   Pulse (!) 119   Temp (!) 102 F (38.9 C) (Rectal)   Resp (!) 35   Ht 5\' 4"  (1.626 m)   Wt 77.1 kg   SpO2 100%   BMI 29.18 kg/m   Physical Exam Vitals signs and nursing note reviewed.  Constitutional:      Appearance: He is ill-appearing.     Comments: Appears ill  HENT:     Head: Normocephalic and atraumatic.  Eyes:     Extraocular Movements: Extraocular movements intact.     Conjunctiva/sclera: Conjunctivae normal.     Pupils: Pupils are equal, round, and reactive to light.  Neck:     Musculoskeletal: Normal range of motion and neck supple.   Cardiovascular:     Rate and Rhythm: Regular rhythm. Tachycardia present.     Pulses: Normal pulses.     Comments: Tachycardic around 130 Pulmonary:     Effort: Tachypnea and accessory muscle usage present.     Breath sounds: Rhonchi present.     Comments: Rhonchi of the right lower lung. tachypneic to 40. Taking short shallow breaths. On [place].  Speaking in short sentences Abdominal:     General: There is no distension.     Palpations: Abdomen is soft.  There is no mass.     Tenderness: There is no abdominal tenderness. There is no guarding or rebound.     Comments: No ttp. Soft without rigidity, guarding, or distention.   Musculoskeletal: Normal range of motion.  Skin:    General: Skin is warm and dry.     Capillary Refill: Capillary refill takes less than 2 seconds.  Neurological:     Mental Status: He is alert and oriented to person, place, and time.      ED Treatments / Results  Labs (all labs ordered are listed, but only abnormal results are displayed) Labs Reviewed  COMPREHENSIVE METABOLIC PANEL - Abnormal; Notable for the following components:      Result Value   Potassium 5.2 (*)    Chloride 90 (*)    Glucose, Bld 105 (*)    BUN 45 (*)    Creatinine, Ser 10.19 (*)    Calcium 8.8 (*)    Total Protein 8.7 (*)    AST 371 (*)    ALT 489 (*)    Alkaline Phosphatase 138 (*)    Total Bilirubin 1.8 (*)    GFR calc non Af Amer 5 (*)    GFR calc Af Amer 6 (*)    Anion gap 23 (*)    All other components within normal limits  CBC WITH DIFFERENTIAL/PLATELET - Abnormal; Notable for the following components:   WBC 13.7 (*)    RBC 3.39 (*)    Hemoglobin 8.1 (*)    HCT 28.3 (*)    MCH 23.9 (*)    MCHC 28.6 (*)    RDW 16.7 (*)    nRBC 0.4 (*)    Neutro Abs 12.3 (*)    Lymphs Abs 0.6 (*)    Abs Immature Granulocytes 0.11 (*)    All other components within normal limits  BRAIN NATRIURETIC PEPTIDE - Abnormal; Notable for the following components:   B Natriuretic Peptide  >4,500.0 (*)    All other components within normal limits  LACTIC ACID, PLASMA - Abnormal; Notable for the following components:   Lactic Acid, Venous 4.7 (*)    All other components within normal limits  I-STAT TROPONIN, ED - Abnormal; Notable for the following components:   Troponin i, poc 0.19 (*)    All other components within normal limits  POCT I-STAT 7, (LYTES, BLD GAS, ICA,H+H) - Abnormal; Notable for the following components:   pH, Arterial 7.620 (*)    pCO2 arterial 28.3 (*)    pO2, Arterial 54.0 (*)    Bicarbonate 29.1 (*)    Acid-Base Excess 8.0 (*)    Sodium 132 (*)    Calcium, Ion 0.88 (*)    HCT 28.0 (*)    Hemoglobin 9.5 (*)    All other components within normal limits  CULTURE, BLOOD (ROUTINE X 2)  CULTURE, BLOOD (ROUTINE X 2)  PROTIME-INR  INFLUENZA PANEL BY PCR (TYPE A & B)  LACTIC ACID, PLASMA  URINALYSIS, ROUTINE W REFLEX MICROSCOPIC  SALICYLATE LEVEL  TYPE AND SCREEN  ABO/RH    EKG EKG Interpretation  Date/Time:  Monday June 06 2018 14:40:06 EST Ventricular Rate:  128 PR Interval:    QRS Duration: 142 QT Interval:  441 QTC Calculation: 644 R Axis:   69 Text Interpretation:  Sinus tachycardia LAE, consider biatrial enlargement Right bundle branch block Confirmed by Dene Gentry [phone]) on 06/06/2018 2:49:50 PM   Radiology Dg Chest Port 1 View  Result Date: 06/06/2018 CLINICAL DATA:  Chest pain and shortness of breath. Chronic renal failure EXAM: PORTABLE CHEST 1 VIEW COMPARISON:  April 07, 2016 FINDINGS: There is cardiomegaly. Pulmonary vascularity is within normal limits. There is ill-defined opacity in the left mid lung. The lungs elsewhere are clear. No adenopathy evident. No bone lesions. IMPRESSION: Probable pneumonia left mid lung region. Lungs elsewhere appear clear. Stable cardiomegaly. No adenopathy evident. Followup PA and lateral chest radiographs recommended in 3-4 weeks following trial of antibiotic therapy to ensure resolution and  exclude underlying malignancy. Electronically Signed   By: Lowella Grip III M.D.   On: 06/06/2018 15:15    Procedures .Critical Care Performed by: Franchot Heidelberg, PA-C Authorized by: Franchot Heidelberg, PA-C   Critical care provider statement:    Critical care time (minutes):  45   Critical care time was exclusive of:  Separately billable procedures and treating other patients and teaching time   Critical care was necessary to treat or prevent imminent or life-threatening deterioration of the following conditions:  Sepsis and respiratory failure   Critical care was time spent personally by me on the following activities:  Blood draw for specimens, development of treatment plan with patient or surrogate, discussions with consultants, evaluation of patient's response to treatment, examination of patient, obtaining history from patient or surrogate, ordering and performing treatments and interventions, ordering and review of laboratory studies, ordering and review of radiographic studies, pulse oximetry, re-evaluation of patient's condition and review of old charts   I assumed direction of critical care for this patient from another provider in my specialty: no   Comments:     Patient septic and in respiratory distress.  Placed on BiPAP, antibiotics and fluids started.  Admitted to critical care.   (including critical care time)  Medications Ordered in ED Medications  vancomycin (VANCOCIN) 1,750 mg in sodium chloride 0.9 % 500 mL IVPB (1,750 mg Intravenous New Bag/Given 06/06/18 1704)  ceFEPIme (MAXIPIME) 1 g in sodium chloride 0.9 % 100 mL IVPB (has no administration in time range)  labetalol (NORMODYNE,TRANDATE) injection 10 mg (has no administration in time range)  acetaminophen (TYLENOL) tablet 1,000 mg (has no administration in time range)  ceFEPIme (MAXIPIME) 2 g in sodium chloride 0.9 % 100 mL IVPB (0 g Intravenous Stopped 06/06/18 1651)  morphine 2 MG/ML injection 2 mg (2 mg  Intravenous Given 06/06/18 1611)  sodium chloride 0.9 % bolus 500 mL (500 mLs Intravenous New Bag/Given 06/06/18 1650)     Initial Impression / Assessment and Plan / ED Course  I have reviewed the triage vital signs and the nursing notes.  Pertinent labs & imaging results that were available during my care of the patient were reviewed by me and considered in my medical decision making (see chart for details).     Pt presenting for evaluation of shortness of breath and chest pain.  Physical exam shows patient who is tachypneic, tachycardic, and appears ill.  Pulmonary exam concerning, rhonchi in the right lower lung, and patient is very tachypneic.  Initially, patient states he is taking short/shallow breaths because it hurts to take a deep breath in, although he later denies this.  Will obtain labs, urine, chest x-ray, code sepsis called, as patient meets sirs criteria with his tachypnea and tachycardia. Case discussed with attending, Dr. Laverta Baltimore evaluated the pt.   Chest x-ray concerning for right lung pneumonia.  HCAP antibiotics started.  500 cc started, patient is not hypotensive and considering ESRD on dialysis, will hold off on 30 cc/kg resuscitation.  Labs oncerning, elevated white count at 13.7, troponin mildly elevated 0.19, liver enzymes elevated.  Elevated white count likely due to pneumonia.  Hemoglobin is stable at 8.1.  Unknown cause for patient's elevated liver enzymes, have not been elevated in the past.  However, patient without abdominal pain, and levels are not concerning for acute liver failure requiring transplant.  As such, will hold off on further investigation.  Patient with chronically elevated troponin, 0.04-.09.  While today's is slightly more elevated, no STEMI on EKG, and patient has multiple other reasons for elevated troponin, including demand ischemia due to tachycardia, pneumonia, sepsis, and CKD.  As such, will hold on cardiology consult, and trend troponins.  ABG  concerning for alkalosis at 7.6.  Additionally, patient's bicarb is up at 21.  Concerning gap of 23. Will consult with nephrology.   Discussed with nephrology, they will evaluated the pt and continue to consult while pt is in the hospital.   Patient's lactic elevated at 4.7.  Patient remains very tachypneic.  As such, likely needs at minimum stepdown bed, but consider need for critical care/intensivist management.  Will consult with critical care.  Pt trialed on bipap. RR improving.   Pt's BP elevated, has h/o HTN and has not taken home meds. Labetalol ordered.   CC to evaluate and accept the pt.   Final Clinical Impressions(s) / ED Diagnoses   Final diagnoses:  HAP (hospital-acquired pneumonia)  Respiratory alkalosis  Elevated LFTs  Sepsis with acute hypoxic respiratory failure without septic shock, due to unspecified organism [facility])    ED Discharge Orders    None       Franchot Heidelberg, PA-C 06/06/18 1815    Margette Fast, MD 06/07/18 1341

## 2018-06-06 NOTE — H&P (Signed)
PULMONARY / CRITICAL CARE MEDICINE   NAME:  Cameron [name], MRN:  [id number], DOB:  04/24/1971, LOS: 0 ADMISSION DATE:  06/06/2018, CONSULTATION DATE:  06/06/2018  REFERRING MD:  Junious Silk, ED, CHIEF COMPLAINT:  resp distress on bipap  BRIEF HISTORY:    48 year old man with ESRD on dialysis, admitted from dialysis with flulike illness and respiratory distress requiring BiPAP HISTORY OF PRESENT ILLNESS   48 year old man with ESRD on dialysis Monday/Wednesday/Friday.  He admits that he is noncompliant and does not complete his treatments fully.  He has been sick for about a week but fevers and a dry cough and he has been self-medicating with NyQuil.  He went for dialysis last week but did not complete it.  He feels that he is up on his fluid.  Finished 2 hours 27 minutes of dialysis today and then felt short of breath and arrived via EMS to the ED. he was noted to be febrile to 102, tachypneic with accessory muscle usage and tachycardic with a rhonchi bilateral.  Placed on BiPAP but respiratory rate remained high 40s Chest x-ray showed left mid zone infiltrate. Labs showed severe respiratory/metabolic alkalosis with mild leukocytosis and lactic acidosis with troponin of 0.19   SIGNIFICANT PAST MEDICAL HISTORY    Past Medical History:  Diagnosis Date  . CHF (congestive heart failure) ([place])   . Hypertension   . Meralgia paraesthetica, right 02/11/2017  . Renal disorder    dialysis MWF     SIGNIFICANT EVENTS:    STUDIES:    EKG 2/3 >> sinus tach , s1 Q3 pattern CULTURES:  2/3 RVP >>  ANTIBIOTICS:  Cefepime 2/3 >> vanc 2/3 >>  LINES/TUBES:    CONSULTANTS:   SUBJECTIVE:    CONSTITUTIONAL: BP (!) 180/111   Pulse (!) 119   Temp (!) 102 F (38.9 C) (Rectal)   Resp (!) 35   Ht 5\' 4"  (1.626 m)   Wt 77.1 kg   SpO2 100%   BMI 29.18 kg/m   No intake/output data recorded.     FiO2 (%):  [2 %] 2 %  PHYSICAL EXAM:  Alert awake, on BiPAP full facemask. Accessory muscle  usage but able to speak in full sentences through the mask No JVD or cervical lymphadenopathy Crackles left base few rhonchi on the right S1-S2 tacky, distant Left arm AV fistula with thrill Soft, nontender abdomen Nonfocal, alert and interactive No edema  RESOLVED PROBLEM LIST   ASSESSMENT AND PLAN   He clearly has a febrile and history of flulike illness with left mid zone infiltrate, influenza or other viral/bacterial pneumonia is very likely.  However his hypoxia and respiratory distress is quite out of proportion to his infiltrate and therefore we have to consider pulmonary embolism. Metabolic alkalosis may be explained by labs being drawn soon after dialysis Lactic acidosis likely related to hypoxia   Acute respiratory failure-continue BiPAP, FiO2 is down to 40% which is reassuring, continue 12/6.  His respiratory rate does seem to slow down when he talks to some degree of anxiety is also possible. He has a remote history of smoking so doubt underlying COPD. Will empirically anticoagulate with heparin while awaiting d-dimer and venous duplex and if these tests are negative and once clinically improved can discontinue heparin  "Healthcare" associated pneumonia versus community-acquired/sepsis-since is beyond the window, does not need Tamiflu. We will send respiratory viral panel and check urine strep antigen if urine available. We will treat empirically with cefepime and vancomycin while awaiting cultures. Lactate will  be repeated  Elevated troponin-likely related to above. Echo from 01/2016 shows cardiomyopathy with EF of 35% and grade 2 diastolic dysfunction. We will repeat echo   SUMMARY OF TODAY'S PLAN:     LABS  Glucose No results for input(s): GLUCAP in the last 168 hours.  BMET Recent Labs  Lab 06/06/18 1445 06/06/18 1604  NA 138 132*  K 5.2* 4.8  CL 90*  --   CO2 25  --   BUN 45*  --   CREATININE 10.19*  --   GLUCOSE 105*  --     Liver Enzymes Recent  Labs  Lab 06/06/18 1445  AST 371*  ALT 489*  ALKPHOS 138*  BILITOT 1.8*  ALBUMIN 4.1    Electrolytes Recent Labs  Lab 06/06/18 1445  CALCIUM 8.8*    CBC Recent Labs  Lab 06/06/18 1445 06/06/18 1604  WBC 13.7*  --   HGB 8.1* 9.5*  HCT 28.3* 28.0*  PLT 295  --     ABG Recent Labs  Lab 06/06/18 1604  PHART 7.620*  PCO2ART 28.3*  PO2ART 54.0*    Coag's Recent Labs  Lab 06/06/18 1445  INR 1.17    Sepsis Markers Recent Labs  Lab 06/06/18 1549  LATICACIDVEN 4.7*    Cardiac Enzymes No results for input(s): TROPONINI, PROBNP in the last 168 hours.  PAST MEDICAL HISTORY :   He  has a past medical history of CHF (congestive heart failure) ([place]), Hypertension, Meralgia paraesthetica, right (02/11/2017), and Renal disorder.  PAST SURGICAL HISTORY:  He  has a past surgical history that includes No past surgeries; Insertion of dialysis catheter (Right, 04/07/2016); and AV fistula placement (Left, 04/07/2016).  Allergies  Allergen Reactions  . No Known Allergies     No current facility-administered medications on file prior to encounter.    Current Outpatient Medications on File Prior to Encounter  Medication Sig  . carbamazepine (TEGRETOL) 200 MG tablet Take 1 tablet (200 mg total) by mouth 2 (two) times daily.  . carvedilol (COREG) 25 MG tablet Take 1 tablet (25 mg total) by mouth 2 (two) times daily with a meal.  . cinacalcet (SENSIPAR) 30 MG tablet Take 2 tablets (60 mg total) by mouth daily with supper.  . cloNIDine (CATAPRES) 0.3 MG tablet Take 0.3 mg by mouth daily.   Marland Kitchen gabapentin (NEURONTIN) 300 MG capsule Take 1 capsule (300 mg total) by mouth at bedtime.  . hydrALAZINE (APRESOLINE) 100 MG tablet Take 1 tablet (100 mg total) by mouth 3 (three) times daily.  Marland Kitchen ibuprofen (ADVIL,MOTRIN) 200 MG tablet Take 200 mg by mouth every 6 (six) hours as needed for moderate pain.   . calcitRIOL (ROCALTROL) 0.5 MCG capsule Take 2 capsules (1 mcg total) by mouth  every other day. (Patient not taking: Reported on 06/06/2018)  . calcium acetate (PHOSLO) 667 MG capsule Take 3 capsules (2,001 mg total) by mouth 3 (three) times daily with meals. (Patient not taking: Reported on 06/06/2018)    FAMILY HISTORY:   His family history includes Kidney failure in his mother.  SOCIAL HISTORY:  He  reports that he has quit smoking. His smoking use included cigarettes. He has a 0.50 pack-year smoking history. He has never used smokeless tobacco. He reports that he does not drink alcohol or use drugs.  REVIEW OF SYSTEMS:    Positive for fever, cough, dyspnea & wheeze  Constitutional: negative for anorexia, fevers and sweats  Eyes: negative for irritation, redness and visual disturbance  Ears, nose,  mouth, throat, and face: negative for earaches, epistaxis, nasal congestion and sore throat   Cardiovascular: negative for lower extremity edema, orthopnea, palpitations and syncope  Gastrointestinal: negative for abdominal pain, constipation, diarrhea, melena, nausea and vomiting  Genitourinary:negative for dysuria, frequency and hematuria  Hematologic/lymphatic: negative for bleeding, easy bruising and lymphadenopathy  Musculoskeletal:negative for arthralgias, muscle weakness and stiff joints  Neurological: negative for coordination problems, gait problems, headaches and weakness  Endocrine: negative for diabetic symptoms including polydipsia, polyuria and weight loss    Kara Mead MD. FCCP. [facility] Pager [phone] If no response call [phone]   06/06/2018

## 2018-06-06 NOTE — Progress Notes (Signed)
Dr Hollie Salk in to assess pt. Pt refusing to wear bipap stating he is hungry and thirsty and will not follow orders until his "demands are met". Importance of bipap use explained to help with his respiratory status to prevent intubation. Pt stating "I dont care if I have to be intubated".  Pt given small amount of ice chips and explained the risks of aspiration to pt who verbalized understanding.

## 2018-06-07 ENCOUNTER — Inpatient Hospital Stay (HOSPITAL_COMMUNITY): Payer: Medicare Other

## 2018-06-07 DIAGNOSIS — I82409 Acute embolism and thrombosis of unspecified deep veins of unspecified lower extremity: Secondary | ICD-10-CM

## 2018-06-07 DIAGNOSIS — J9600 Acute respiratory failure, unspecified whether with hypoxia or hypercapnia: Secondary | ICD-10-CM

## 2018-06-07 LAB — RESPIRATORY PANEL BY PCR
Adenovirus: NOT DETECTED
Bordetella pertussis: NOT DETECTED
Chlamydophila pneumoniae: NOT DETECTED
Coronavirus 229E: NOT DETECTED
Coronavirus HKU1: NOT DETECTED
Coronavirus NL63: NOT DETECTED
Coronavirus OC43: NOT DETECTED
Influenza A: NOT DETECTED
Influenza B: NOT DETECTED
Metapneumovirus: NOT DETECTED
Mycoplasma pneumoniae: NOT DETECTED
PARAINFLUENZA VIRUS 1-RVPPCR: NOT DETECTED
PARAINFLUENZA VIRUS 4-RVPPCR: NOT DETECTED
Parainfluenza Virus 2: NOT DETECTED
Parainfluenza Virus 3: NOT DETECTED
Respiratory Syncytial Virus: NOT DETECTED
Rhinovirus / Enterovirus: NOT DETECTED

## 2018-06-07 LAB — ECHOCARDIOGRAM COMPLETE
Height: 64 in
Weight: 3044.11 oz

## 2018-06-07 LAB — RENAL FUNCTION PANEL
ANION GAP: 16 — AB (ref 5–15)
Albumin: 3.6 g/dL (ref 3.5–5.0)
BUN: 59 mg/dL — ABNORMAL HIGH (ref 6–20)
CALCIUM: 8.1 mg/dL — AB (ref 8.9–10.3)
CO2: 26 mmol/L (ref 22–32)
Chloride: 94 mmol/L — ABNORMAL LOW (ref 98–111)
Creatinine, Ser: 11.4 mg/dL — ABNORMAL HIGH (ref 0.61–1.24)
GFR calc Af Amer: 6 mL/min — ABNORMAL LOW (ref >60)
GFR calc non Af Amer: 5 mL/min — ABNORMAL LOW (ref >60)
Glucose, Bld: 106 mg/dL — ABNORMAL HIGH (ref 70–99)
Phosphorus: 4.8 mg/dL — ABNORMAL HIGH (ref 2.5–4.6)
Potassium: 7.1 mmol/L (ref 3.5–5.1)
Sodium: 136 mmol/L (ref 135–145)

## 2018-06-07 LAB — POCT I-STAT 7, (LYTES, BLD GAS, ICA,H+H)
Acid-Base Excess: 6 mmol/L — ABNORMAL HIGH (ref 0.0–2.0)
Bicarbonate: 27.9 mmol/L (ref 20.0–28.0)
Calcium, Ion: 0.86 mmol/L — CL (ref 1.15–1.40)
HEMATOCRIT: 25 % — AB (ref 39.0–52.0)
Hemoglobin: 8.5 g/dL — ABNORMAL LOW (ref 13.0–17.0)
O2 Saturation: 99 %
POTASSIUM: 6.5 mmol/L — AB (ref 3.5–5.1)
Patient temperature: 98
Sodium: 132 mmol/L — ABNORMAL LOW (ref 135–145)
TCO2: 29 mmol/L (ref 22–32)
pCO2 arterial: 29.3 mmHg — ABNORMAL LOW (ref 32.0–48.0)
pH, Arterial: 7.585 — ABNORMAL HIGH (ref 7.350–7.450)
pO2, Arterial: 127 mmHg — ABNORMAL HIGH (ref 83.0–108.0)

## 2018-06-07 LAB — TROPONIN I
Troponin I: 0.32 ng/mL (ref <0.03)
Troponin I: 0.57 ng/mL (ref <0.03)

## 2018-06-07 LAB — BASIC METABOLIC PANEL
BUN: 67 mg/dL — ABNORMAL HIGH (ref 6–20)
CHLORIDE: 91 mmol/L — AB (ref 98–111)
CO2: 25 mmol/L (ref 22–32)
Calcium: 8.1 mg/dL — ABNORMAL LOW (ref 8.9–10.3)
Creatinine, Ser: 12.66 mg/dL — ABNORMAL HIGH (ref 0.61–1.24)
GFR calc Af Amer: 5 mL/min — ABNORMAL LOW (ref >60)
GFR calc non Af Amer: 4 mL/min — ABNORMAL LOW (ref >60)
Glucose, Bld: 122 mg/dL — ABNORMAL HIGH (ref 70–99)
Potassium: 5.3 mmol/L — ABNORMAL HIGH (ref 3.5–5.1)
Sodium: 138 mmol/L (ref 135–145)

## 2018-06-07 LAB — HEPARIN LEVEL (UNFRACTIONATED)
Heparin Unfractionated: 0.46 IU/mL (ref 0.30–0.70)
Heparin Unfractionated: 0.67 IU/mL (ref 0.30–0.70)

## 2018-06-07 LAB — CBC
HCT: 26.5 % — ABNORMAL LOW (ref 39.0–52.0)
Hemoglobin: 7.8 g/dL — ABNORMAL LOW (ref 13.0–17.0)
MCH: 23.9 pg — ABNORMAL LOW (ref 26.0–34.0)
MCHC: 29.4 g/dL — ABNORMAL LOW (ref 30.0–36.0)
MCV: 81.3 fL (ref 80.0–100.0)
NRBC: 0.6 % — AB (ref 0.0–0.2)
Platelets: 231 10*3/uL (ref 150–400)
RBC: 3.26 MIL/uL — ABNORMAL LOW (ref 4.22–5.81)
RDW: 16.8 % — ABNORMAL HIGH (ref 11.5–15.5)
WBC: 12.7 10*3/uL — ABNORMAL HIGH (ref 4.0–10.5)

## 2018-06-07 LAB — PHOSPHORUS: Phosphorus: 5.7 mg/dL — ABNORMAL HIGH (ref 2.5–4.6)

## 2018-06-07 LAB — MRSA PCR SCREENING: MRSA by PCR: NEGATIVE

## 2018-06-07 LAB — ALBUMIN: Albumin: 3.6 g/dL (ref 3.5–5.0)

## 2018-06-07 LAB — HIV ANTIBODY (ROUTINE TESTING W REFLEX): HIV Screen 4th Generation wRfx: NONREACTIVE

## 2018-06-07 LAB — LACTIC ACID, PLASMA: Lactic Acid, Venous: 2.1 mmol/L (ref 0.5–1.9)

## 2018-06-07 MED ORDER — CARVEDILOL 25 MG PO TABS
25.0000 mg | ORAL_TABLET | Freq: Two times a day (BID) | ORAL | Status: DC
  Administered 2018-06-07 – 2018-06-09 (×4): 25 mg via ORAL
  Filled 2018-06-07 (×4): qty 1

## 2018-06-07 MED ORDER — SODIUM CHLORIDE 0.9 % IV SOLN
INTRAVENOUS | Status: DC | PRN
  Administered 2018-06-07 – 2018-06-08 (×2): 250 mL via INTRAVENOUS

## 2018-06-07 MED ORDER — VANCOMYCIN HCL IN DEXTROSE 1-5 GM/200ML-% IV SOLN
1000.0000 mg | Freq: Once | INTRAVENOUS | Status: AC
  Administered 2018-06-07: 1000 mg via INTRAVENOUS
  Filled 2018-06-07: qty 200

## 2018-06-07 MED ORDER — VANCOMYCIN HCL IN DEXTROSE 1-5 GM/200ML-% IV SOLN
1000.0000 mg | INTRAVENOUS | Status: DC
  Administered 2018-06-08: 1000 mg via INTRAVENOUS
  Filled 2018-06-07 (×2): qty 200

## 2018-06-07 MED ORDER — HYDRALAZINE HCL 20 MG/ML IJ SOLN
10.0000 mg | INTRAMUSCULAR | Status: DC | PRN
  Administered 2018-06-07 (×2): 10 mg via INTRAVENOUS
  Filled 2018-06-07 (×2): qty 1

## 2018-06-07 MED ORDER — SODIUM ZIRCONIUM CYCLOSILICATE 10 G PO PACK
10.0000 g | PACK | Freq: Once | ORAL | Status: AC
  Administered 2018-06-07: 10 g via ORAL
  Filled 2018-06-07: qty 1

## 2018-06-07 MED ORDER — NITROGLYCERIN IN D5W 200-5 MCG/ML-% IV SOLN
0.0000 ug/min | INTRAVENOUS | Status: DC
  Administered 2018-06-07: 5 ug/min via INTRAVENOUS
  Administered 2018-06-08: 50 ug/min via INTRAVENOUS
  Filled 2018-06-07 (×2): qty 250

## 2018-06-07 MED ORDER — SODIUM POLYSTYRENE SULFONATE 15 GM/60ML PO SUSP
30.0000 g | Freq: Once | ORAL | Status: AC
  Administered 2018-06-07: 30 g via RECTAL
  Filled 2018-06-07: qty 120

## 2018-06-07 MED ORDER — CHLORHEXIDINE GLUCONATE CLOTH 2 % EX PADS: 6.0000 | MEDICATED_PAD | Freq: Every day | CUTANEOUS | Status: DC

## 2018-06-07 MED ORDER — HYDRALAZINE HCL 50 MG PO TABS
100.0000 mg | ORAL_TABLET | Freq: Three times a day (TID) | ORAL | Status: DC
  Administered 2018-06-07 – 2018-06-09 (×3): 100 mg via ORAL
  Filled 2018-06-07 (×4): qty 2

## 2018-06-07 MED ORDER — SODIUM CHLORIDE 0.9 % IV SOLN
2.0000 g | INTRAVENOUS | Status: DC
  Administered 2018-06-08: 2 g via INTRAVENOUS
  Filled 2018-06-07: qty 2

## 2018-06-07 NOTE — Progress Notes (Signed)
Bilateral lower extremity venous duplex has been completed.  Preliminary results can be found in CV Proc through chart review.   06/07/18 10:16 AM [facility]

## 2018-06-07 NOTE — Progress Notes (Signed)
[facility] for Heparin Indication: VTE treatment  Allergies  Allergen Reactions  . No Known Allergies     Patient Measurements: Height: 5\' 4"  (162.6 cm) Weight: 187 lb 13.3 oz (85.2 kg) IBW/kg (Calculated) : 59.2 Heparin Dosing Weight: 75 kg  Vital Signs: Temp: 100.6 F (38.1 C) (02/04 0400) Temp Source: Axillary (02/04 0400) BP: 182/110 (02/04 0600) Pulse Rate: 121 (02/04 0600)  Labs: Recent Labs    06/06/18 1445 06/06/18 1604 06/06/18 2128 06/07/18 0023 06/07/18 0324  HGB 8.1* 9.5* 7.9*  --   --   HCT 28.3* 28.0* 25.4*  --   --   PLT 295  --  247  --   --   LABPROT 14.8  --   --   --   --   INR 1.17  --   --   --   --   HEPARINUNFRC  --   --   --   --  0.46  CREATININE 10.19*  --  11.45* 11.40*  --   TROPONINI  --   --  0.25* 0.32*  --     Estimated Creatinine Clearance: 8.1 mL/min (A) (by C-G formula based on SCr of 11.4 mg/dL (H)).   Medical History: Past Medical History:  Diagnosis Date  . CHF (congestive heart failure) ([place])   . Hypertension   . Meralgia paraesthetica, right 02/11/2017  . Renal disorder    dialysis MWF    Assessment: 48 year old man with ESRD on dialysis, admitted from dialysis with flulike illness and respiratory distress requiring BiPAP. Patient not on anticoagulation prior to admission. MD awaiting D-dimer and duplex. Pharmacy consulted to start heparin for VTE treatment.  2/4 AM update: initial heparin level therapeutic   Goal of Therapy:  Heparin level 0.3-0.7 units/ml Monitor platelets by anticoagulation protocol: Yes   Plan:  Cont heparin 1250 units/hr 1200 confirmatory heparin level  F/U VTE work-up  Narda Bonds, PharmD, BCPS Clinical Pharmacist Phone: [phone]

## 2018-06-07 NOTE — Progress Notes (Signed)
RT NOTE: Patient removed from bipap per MD as patient just finished his dialysis. Vitals are currently stable and sats are 98%. RT will continue to monitor.

## 2018-06-07 NOTE — Progress Notes (Signed)
[facility] NEPHROLOGY PROGRESS NOTE  Assessment/ Plan: Pt is a 48 y.o. yo male ESRD on HD MWF, admitted with respiratory distress, fever. Dialyzes at [facility] 4 hrs  EDW 85.5 kg 2K, 2.5 ca, URR 77, LUA AVF, mircera 225 mcg and IV iron 100 mg last dose on 06/01/2018.PTH 1120  #Fever, respiratory distress: Currently on broad-spectrum antibiotics with vancomycin and cefepime for possible H CAP.  On BiPAP.  Per pulmonary team.  # ESRD: MWF schedule, receiving extra dialysis treatment today.  Increase UF goal to 3 to 3.5 kg as tolerated.  Plan for regular dialysis tomorrow.  Patient was noncompliant with full dialysis treatment as outpatient.  # Anemia: Hemoglobin 7.8.  I will check iron stores.  Received Mircera on 1/29.  # Secondary hyperparathyroidism: Check phosphorus level.  Is currently n.p.o.  #Hypertensive urgency: Currently on nitro drip.  Resume home blood pressure medication.  Continue to monitor.  Subjective: Seen and examined in ICU.  Using BiPAP, on nitro drip.  Receiving dialysis.  Tolerating well.  UF goal increased.  No chest pain.  Reports discomfort with the BiPAP. Objective Vital signs in last 24 hours: Vitals:   06/07/18 0845 06/07/18 0900 06/07/18 0915 06/07/18 0930  BP: (!) 207/145 (!) 219/133 (!) 213/128 (!) 225/144  Pulse: (!) 122 (!) 121 (!) 121 (!) 123  Resp: (!) 25 (!) 31 (!) 24 (!) 29  Temp:      TempSrc:      SpO2: 95% 98% 97% 96%  Weight:      Height:       Weight change:   Intake/Output Summary (Last 24 hours) at 06/07/2018 0952 Last data filed at 06/07/2018 0800 Gross per 24 hour  Intake 1055.05 ml  Output 0 ml  Net 1055.05 ml       Labs: Basic Metabolic Panel: Recent Labs  Lab 06/06/18 1445  06/06/18 2128 06/06/18 2221 06/07/18 0023 06/07/18 0612 06/07/18 0733  NA 138   < >  --  132* 136 138  --   K 5.2*   < >  --  6.5* 7.1* 5.3*  --   CL 90*  --   --   --  94* 91*  --   CO2 25  --   --   --  26 25  --   GLUCOSE 105*  --    --   --  106* 122*  --   BUN 45*  --   --   --  59* 67*  --   CREATININE 10.19*  --  11.45*  --  11.40* 12.66*  --   CALCIUM 8.8*  --   --   --  8.1* 8.1*  --   PHOS  --   --   --   --  4.8*  --  5.7*   < > = values in this interval not displayed.   Liver Function Tests: Recent Labs  Lab 06/06/18 1445 06/07/18 0023 06/07/18 0733  AST 371*  --   --   ALT 489*  --   --   ALKPHOS 138*  --   --   BILITOT 1.8*  --   --   PROT 8.7*  --   --   ALBUMIN 4.1 3.6 3.6   No results for input(s): LIPASE, AMYLASE in the last 168 hours. No results for input(s): AMMONIA in the last 168 hours. CBC: Recent Labs  Lab 06/06/18 1445  06/06/18 2128 06/06/18 2221 06/07/18 0612  WBC  13.7*  --  17.0*  --  12.7*  NEUTROABS 12.3*  --   --   --   --   HGB 8.1*   < > 7.9* 8.5* 7.8*  HCT 28.3*   < > 25.4* 25.0* 26.5*  MCV 83.5  --  81.7  --  81.3  PLT 295  --  247  --  231   < > = values in this interval not displayed.   Cardiac Enzymes: Recent Labs  Lab 06/06/18 2128 06/07/18 0023 06/07/18 0612  TROPONINI 0.25* 0.32* 0.57*   CBG: No results for input(s): GLUCAP in the last 168 hours.  Iron Studies: No results for input(s): IRON, TIBC, TRANSFERRIN, FERRITIN in the last 72 hours. Studies/Results: Dg Chest Port 1 View  Result Date: 06/07/2018 CLINICAL DATA:  Shortness of breath and hypoxia today. History of CHF. EXAM: PORTABLE CHEST 1 VIEW COMPARISON:  Chest radiograph June 10, 2018 FINDINGS: Stable cardiomegaly. Pulmonary vascular congestion without pleural effusion or focal consolidation. No pneumothorax. Soft tissue planes and included osseous structures are non suspicious. IMPRESSION: 1. Stable cardiomegaly and pulmonary vascular congestion. Electronically Signed   By: Elon Alas M.D.   On: 06/07/2018 04:26   Dg Chest Port 1 View  Result Date: 06/06/2018 CLINICAL DATA:  Chest pain and shortness of breath. Chronic renal failure EXAM: PORTABLE CHEST 1 VIEW COMPARISON:  April 07, 2016 FINDINGS: There is cardiomegaly. Pulmonary vascularity is within normal limits. There is ill-defined opacity in the left mid lung. The lungs elsewhere are clear. No adenopathy evident. No bone lesions. IMPRESSION: Probable pneumonia left mid lung region. Lungs elsewhere appear clear. Stable cardiomegaly. No adenopathy evident. Followup PA and lateral chest radiographs recommended in 3-4 weeks following trial of antibiotic therapy to ensure resolution and exclude underlying malignancy. Electronically Signed   By: Lowella Grip III M.D.   On: 06/06/2018 15:15    Medications: Infusions: . sodium chloride    . sodium chloride    . ceFEPime (MAXIPIME) IV    . heparin 1,250 Units/hr (06/07/18 0800)  . nitroGLYCERIN 30 mcg/min (06/07/18 0800)    Scheduled Medications: . acetaminophen  1,000 mg Oral Once  . Chlorhexidine Gluconate Cloth  6 each Topical Q0600  . labetalol  10 mg Intravenous Once    have reviewed scheduled and prn medications.  Physical Exam: General: Using BiPAP, lying on bed Heart:RRR, s1s2 nl, no rubs Lungs: Coarse breath sound bilateral, no wheezing Abdomen:soft, Non-tender, non-distended Extremities:No edema Dialysis Access: AV fistula has a good thrill and bruit.  Dron Prasad Bhandari 06/07/2018,9:52 AM  LOS: 1 day

## 2018-06-07 NOTE — Progress Notes (Signed)
[place] for Heparin Indication: VTE treatment  Allergies  Allergen Reactions  . No Known Allergies    Patient Measurements: Height: 5\' 4"  (162.6 cm) Weight: 182 lb 15.7 oz (83 kg) IBW/kg (Calculated) : 59.2 Heparin Dosing Weight: 75 kg  Vital Signs: Temp: 99.2 F (37.3 C) (02/04 1134) Temp Source: Oral (02/04 1134) BP: 197/124 (02/04 1134) Pulse Rate: 128 (02/04 1216)  Labs: Recent Labs    06/06/18 1445  06/06/18 2128 06/06/18 2221 06/07/18 0023 06/07/18 0324 06/07/18 0612 06/07/18 1141  HGB 8.1*   < > 7.9* 8.5*  --   --  7.8*  --   HCT 28.3*   < > 25.4* 25.0*  --   --  26.5*  --   PLT 295  --  247  --   --   --  231  --   LABPROT 14.8  --   --   --   --   --   --   --   INR 1.17  --   --   --   --   --   --   --   HEPARINUNFRC  --   --   --   --   --  0.46  --  0.67  CREATININE 10.19*  --  11.45*  --  11.40*  --  12.66*  --   TROPONINI  --   --  0.25*  --  0.32*  --  0.57*  --    < > = values in this interval not displayed.    Estimated Creatinine Clearance: 7.2 mL/min (A) (by C-G formula based on SCr of 12.66 mg/dL (H)).  Medical History: Past Medical History:  Diagnosis Date  . CHF (congestive heart failure) ([place])   . Hypertension   . Meralgia paraesthetica, right 02/11/2017  . Renal disorder    dialysis MWF   Assessment: 75 yoM with ESRD on HD, admitted from HD with flu-like illness and respiratory distress requiring BiPAP. Pharmacy consulted to dose IV heparin for possible VTE. Patient not on anticoagulation prior to admission. D-dimer 2.15, venous duplex negative for DVT. Heparin level this afternoon remains therapeutic just within goal range at 0.67. Hgb7.8, pltc WNL. No bleeding noted.  Goal of Therapy:  Heparin level 0.3-0.7 units/ml Monitor platelets by anticoagulation protocol: Yes   Plan:  Decrease heparin gtt slightly to 1200 units/hr Daily CBC and heparin level Monitor s/sx of bleeding F/u need to  continue heparin gtt given negative venous duplex  Rozelle Caudle N. Gerarda Fraction, PharmD, [place] PGY2 Infectious Diseases Pharmacy Resident Phone: [phone] 06/07/18   12:49 PM

## 2018-06-07 NOTE — Progress Notes (Addendum)
PULMONARY / CRITICAL CARE MEDICINE   NAME:  Cameron [name], MRN:  [id number], DOB:  1970/06/15, LOS: 1 ADMISSION DATE:  06/06/2018, CONSULTATION DATE:  06/07/2018  REFERRING MD:  Junious Silk, ED, CHIEF COMPLAINT:  resp distress on bipap  BRIEF HISTORY:    48 year old man with ESRD on dialysis, admitted from dialysis with flulike illness and respiratory distress requiring BiPAP  HISTORY OF PRESENT ILLNESS   48 year old man with ESRD on dialysis Monday/Wednesday/Friday.  He admits that he is noncompliant and does not complete his treatments fully.  He has been sick for about a week but fevers and a dry cough and he has been self-medicating with NyQuil.  He went for dialysis last week but did not complete it.  He feels that he is up on his fluid.  Finished 2 hours 27 minutes of dialysis today and then felt short of breath and arrived via EMS to the ED. he was noted to be febrile to 102, tachypneic with accessory muscle usage and tachycardic with a rhonchi bilateral.  Placed on BiPAP but respiratory rate remained high 40s Chest x-ray showed left mid zone infiltrate. Labs showed severe respiratory/metabolic alkalosis with mild leukocytosis and lactic acidosis with troponin of 0.19  SIGNIFICANT PAST MEDICAL HISTORY    Past Medical History:  Diagnosis Date  . CHF (congestive heart failure) ([place])   . Hypertension   . Meralgia paraesthetica, right 02/11/2017  . Renal disorder    dialysis MWF    SIGNIFICANT EVENTS:    STUDIES:   EKG 2/3 >> sinus tach , s1 Q3 pattern  X-ray 2/4- Cardiomegaly, pulmonary vascular congestion.  CULTURES:  2/3 RVP >> 2/3 Flu PCR >> neg 2/3 blood cultures >> NGTD  ANTIBIOTICS:  Cefepime 2/3 >> Vanc 2/3 >>  LINES/TUBES:    CONSULTANTS:   SUBJECTIVE:  Continues on the BiPAP and nitroglycerin drip Receiving intermittent doses of hydralazine for elevated blood pressure Currently on dialysis with removal of 3 L of fluid Wants to get off  BiPAP.  CONSTITUTIONAL: BP (!) 225/144   Pulse (!) 123   Temp 99.8 F (37.7 C) (Axillary)   Resp (!) 29   Ht 5\' 4"  (1.626 m)   Wt 86.3 kg   SpO2 96%   BMI 32.66 kg/m   I/O last 3 completed shifts: In: 1034.1 [P.O.:150; I.V.:233.3; IV Piggyback:650.8] Out: 0      FiO2 (%):  [2 %-80 %] 50 %  PHYSICAL EXAM: Gen:      No acute distress on BiPAP HEENT:  EOMI, sclera anicteric Neck:     No masses; no thyromegaly Lungs:    Clear to auscultation bilaterally; normal respiratory effort CV:         Regular rate and rhythm; no murmurs Abd:      + bowel sounds; soft, non-tender; no palpable masses, no distension Ext:    No edema; adequate peripheral perfusion Skin:      Warm and dry; no rash Neuro: alert and oriented x 3  RESOLVED PROBLEM LIST   ASSESSMENT AND PLAN   48 year old end-stage renal disease, noncompliant with dialysis therapy admitted with hypoxic respiratory failure, hypertensive emergency, flulike illness Being treated for possible PE, HCAP, volume overload with pulmonary edema  Respiratory failure Continue BiPAP Will attempt to get him off after dialysis is complete Continue cefepime, vancomycin Follow cultures Empiric heparin for now given elevated d-dimer.  Awaiting lower extremity Dopplers. Check lactic acid.  Elevated troponins, history of cardiomyopathy Hypertensive emergency Continue nitroglycerin drip, hydralazine PRN. Resume p.o.  meds when able to take orals. Follow troponins, repeat echocardiogram.  End-stage renal disease Dialysis today Appreciate help from nephrology.  SUMMARY OF TODAY'S PLAN:     LABS  Glucose No results for input(s): GLUCAP in the last 168 hours.  BMET Recent Labs  Lab 06/06/18 1445  06/06/18 2128 06/06/18 2221 06/07/18 0023 06/07/18 0612  NA 138   < >  --  132* 136 138  K 5.2*   < >  --  6.5* 7.1* 5.3*  CL 90*  --   --   --  94* 91*  CO2 25  --   --   --  26 25  BUN 45*  --   --   --  59* 67*  CREATININE  10.19*  --  11.45*  --  11.40* 12.66*  GLUCOSE 105*  --   --   --  106* 122*   < > = values in this interval not displayed.    Liver Enzymes Recent Labs  Lab 06/06/18 1445 06/07/18 0023 06/07/18 0733  AST 371*  --   --   ALT 489*  --   --   ALKPHOS 138*  --   --   BILITOT 1.8*  --   --   ALBUMIN 4.1 3.6 3.6    Electrolytes Recent Labs  Lab 06/06/18 1445 06/07/18 0023 06/07/18 0612 06/07/18 0733  CALCIUM 8.8* 8.1* 8.1*  --   PHOS  --  4.8*  --  5.7*    CBC Recent Labs  Lab 06/06/18 1445  06/06/18 2128 06/06/18 2221 06/07/18 0612  WBC 13.7*  --  17.0*  --  12.7*  HGB 8.1*   < > 7.9* 8.5* 7.8*  HCT 28.3*   < > 25.4* 25.0* 26.5*  PLT 295  --  247  --  231   < > = values in this interval not displayed.    ABG Recent Labs  Lab 06/06/18 1604 06/06/18 2221  PHART 7.620* 7.585*  PCO2ART 28.3* 29.3*  PO2ART 54.0* 127.0*    Coag's Recent Labs  Lab 06/06/18 1445  INR 1.17    Sepsis Markers Recent Labs  Lab 06/06/18 1549 06/06/18 1737  LATICACIDVEN 4.7* 4.1*    Cardiac Enzymes Recent Labs  Lab 06/06/18 2128 06/07/18 0023 06/07/18 0612  TROPONINI 0.25* 0.32* 0.57*   The patient is critically ill with multiple organ system failure and requires high complexity decision making for assessment and support, frequent evaluation and titration of therapies, advanced monitoring, review of radiographic studies and interpretation of complex data.   Critical Care Time devoted to patient care services, exclusive of separately billable procedures, described in this note is 35 minutes.   Marshell Garfinkel MD [facility] Pager [phone] If no answer call [phone] 06/07/2018, 9:55 AM

## 2018-06-07 NOTE — Progress Notes (Signed)
eLink Physician-Brief Progress Note Patient Name: Rory Xiang DOB: [birth year] MRN: [id number]   Date of Service  06/07/2018  HPI/Events of Note  Hypertension - BP = 201/70.   eICU Interventions  Will order: 1. Hydralazine 10 mg IV Q 4 hours PRN SBP > 180.      Intervention Category Major Interventions: Hypertension - evaluation and management  Sommer,Steven Eugene 06/07/2018, 2:07 AM

## 2018-06-07 NOTE — Progress Notes (Signed)
Pharmacy Antibiotic Note  Cameron [name] is a 48 y.o. male admitted on 06/06/2018 with suspected pneumonia. Pharmacy has been consulted for vancomycin and cefepime dosing. Patient is ESRD on HD MWF. Plan for extra HD today 2/4 to remove excess volume.     Plan: Cefepime 1g IV today after HD and then 2g IV qHD MWF Vancomycin 1000mg  IV today after HD and then qHD MWF F/u clinical status, C&S, HD schedule, de-escalation, LOT, vancomycin levels as appropriate   Height: 5\' 4"  (162.6 cm) Weight: 190 lb 4.1 oz (86.3 kg) IBW/kg (Calculated) : 59.2  Temp (24hrs), Avg:100.1 F (37.8 C), Min:97.9 F (36.6 C), Max:102 F (38.9 C)  Recent Labs  Lab 06/06/18 1445 06/06/18 1549 06/06/18 1737 06/06/18 2128 06/07/18 0023 06/07/18 0612  WBC 13.7*  --   --  17.0*  --  12.7*  CREATININE 10.19*  --   --  11.45* 11.40* 12.66*  LATICACIDVEN  --  4.7* 4.1*  --   --   --     Estimated Creatinine Clearance: 7.3 mL/min (A) (by C-G formula based on SCr of 12.66 mg/dL (H)).    Allergies  Allergen Reactions  . No Known Allergies     Antimicrobials this admission: Vancomycin 2/3 >> Cefepime 2/3 >>  Microbiology results: 2/3 Bcx: NG < 24h 2/3 flu PCR: neg 2/4 MRSA PCR: neg 2/4 RVP: neg  Thank you for allowing pharmacy to be a part of this patient's care.  Mila Merry Gerarda Fraction, PharmD, [place] PGY2 Infectious Diseases Pharmacy Resident Phone: [phone] 06/07/2018 11:18 AM

## 2018-06-07 NOTE — Progress Notes (Signed)
eLink Physician-Brief Progress Note Patient Name: Cameron [name] DOB: Sep 05, 1970 MRN: [id number]   Date of Service  06/07/2018  HPI/Events of Note  Hypertension - BP = 203/58. No effect from Hydralazine IV.  Sat = 87-92%.  eICU Interventions  Will order: 1. Nitroglycerin IV infusion. Titrate to SBP < 180.      Intervention Category Major Interventions: Hypertension - evaluation and management  Rozetta Stumpp Eugene 06/07/2018, 3:53 AM

## 2018-06-07 NOTE — Progress Notes (Signed)
eLink Physician-Brief Progress Note Patient Name: Cameron [name] DOB: 06/28/1970 MRN: [id number]   Date of Service  06/07/2018  HPI/Events of Note  Hyperkalemia - K+ = 7.1. ESRD on HD. NPO on BiPAP.   eICU Interventions  Will order: 1. Kayexalate 30 gm PR X 1 now.  2. Repeat BMP at 5 AM.  3. Bedside nurse requested to inform renal service of K+ result. The patient will need HD.      Intervention Category Major Interventions: Electrolyte abnormality - evaluation and management  [name] 06/07/2018, 1:53 AM

## 2018-06-08 ENCOUNTER — Inpatient Hospital Stay (HOSPITAL_COMMUNITY): Payer: Medicare Other

## 2018-06-08 LAB — RENAL FUNCTION PANEL
ANION GAP: 21 — AB (ref 5–15)
Albumin: 3 g/dL — ABNORMAL LOW (ref 3.5–5.0)
BUN: 36 mg/dL — ABNORMAL HIGH (ref 6–20)
CO2: 25 mmol/L (ref 22–32)
Calcium: 7.8 mg/dL — ABNORMAL LOW (ref 8.9–10.3)
Chloride: 87 mmol/L — ABNORMAL LOW (ref 98–111)
Creatinine, Ser: 8.36 mg/dL — ABNORMAL HIGH (ref 0.61–1.24)
GFR calc Af Amer: 8 mL/min — ABNORMAL LOW (ref >60)
GFR, EST NON AFRICAN AMERICAN: 7 mL/min — AB (ref >60)
Glucose, Bld: 159 mg/dL — ABNORMAL HIGH (ref 70–99)
Phosphorus: 6.2 mg/dL — ABNORMAL HIGH (ref 2.5–4.6)
Potassium: 3.6 mmol/L (ref 3.5–5.1)
SODIUM: 133 mmol/L — AB (ref 135–145)

## 2018-06-08 LAB — CBC
HCT: 25.2 % — ABNORMAL LOW (ref 39.0–52.0)
Hemoglobin: 7.8 g/dL — ABNORMAL LOW (ref 13.0–17.0)
MCH: 25.1 pg — ABNORMAL LOW (ref 26.0–34.0)
MCHC: 31 g/dL (ref 30.0–36.0)
MCV: 81 fL (ref 80.0–100.0)
NRBC: 0.4 % — AB (ref 0.0–0.2)
Platelets: 259 10*3/uL (ref 150–400)
RBC: 3.11 MIL/uL — ABNORMAL LOW (ref 4.22–5.81)
RDW: 16.7 % — ABNORMAL HIGH (ref 11.5–15.5)
WBC: 13.8 10*3/uL — ABNORMAL HIGH (ref 4.0–10.5)

## 2018-06-08 LAB — IRON AND TIBC
IRON: 20 ug/dL — AB (ref 45–182)
Saturation Ratios: 9 % — ABNORMAL LOW (ref 17.9–39.5)
TIBC: 230 ug/dL — ABNORMAL LOW (ref 250–450)
UIBC: 210 ug/dL

## 2018-06-08 LAB — HEPARIN LEVEL (UNFRACTIONATED): Heparin Unfractionated: 0.16 IU/mL — ABNORMAL LOW (ref 0.30–0.70)

## 2018-06-08 LAB — MAGNESIUM: Magnesium: 2.1 mg/dL (ref 1.7–2.4)

## 2018-06-08 LAB — TROPONIN I: Troponin I: 0.35 ng/mL (ref <0.03)

## 2018-06-08 LAB — FERRITIN: Ferritin: 1378 ng/mL — ABNORMAL HIGH (ref 24–336)

## 2018-06-08 MED ORDER — PENTAFLUOROPROP-TETRAFLUOROETH EX AERO: 1.0000 "application " | INHALATION_SPRAY | CUTANEOUS | Status: DC | PRN

## 2018-06-08 MED ORDER — HEPARIN SODIUM (PORCINE) 1000 UNIT/ML DIALYSIS: 1000.0000 [IU] | INTRAMUSCULAR | Status: DC | PRN

## 2018-06-08 MED ORDER — SODIUM CHLORIDE 0.9 % IV SOLN: 100.0000 mL | INTRAVENOUS | Status: DC | PRN

## 2018-06-08 MED ORDER — LIDOCAINE HCL (PF) 1 % IJ SOLN: 5.0000 mL | INTRAMUSCULAR | Status: DC | PRN

## 2018-06-08 MED ORDER — CALCIUM ACETATE (PHOS BINDER) 667 MG PO CAPS
667.0000 mg | ORAL_CAPSULE | Freq: Three times a day (TID) | ORAL | Status: DC
  Administered 2018-06-08 – 2018-06-09 (×3): 667 mg via ORAL
  Filled 2018-06-08 (×3): qty 1

## 2018-06-08 MED ORDER — IOPAMIDOL (ISOVUE-370) INJECTION 76%
100.0000 mL | Freq: Once | INTRAVENOUS | Status: AC | PRN
  Administered 2018-06-08: 100 mL via INTRAVENOUS

## 2018-06-08 MED ORDER — SODIUM CHLORIDE 0.9 % IV SOLN: 125.0000 mg | INTRAVENOUS | Status: DC

## 2018-06-08 MED ORDER — ALTEPLASE 2 MG IJ SOLR: 2.0000 mg | Freq: Once | INTRAMUSCULAR | Status: DC | PRN

## 2018-06-08 MED ORDER — LIDOCAINE-PRILOCAINE 2.5-2.5 % EX CREA: 1.0000 "application " | TOPICAL_CREAM | CUTANEOUS | Status: DC | PRN

## 2018-06-08 NOTE — Progress Notes (Signed)
[facility] NEPHROLOGY PROGRESS NOTE  Assessment/ Plan: Pt is a 48 y.o. yo male ESRD on HD MWF, admitted with respiratory distress, fever. Dialyzes at [facility] 4 hrs  EDW 85.5 kg 2K, 2.5 ca, URR 77, LUA AVF, mircera 225 mcg and IV iron 100 mg last dose on 06/01/2018.PTH 1120  #Fever, respiratory distress: Currently on broad-spectrum antibiotics with vancomycin and cefepime for possible H CAP.  On BiPAP.  Per pulmonary team.  # ESRD: MWF schedule, had extra treatment yesterday with 3 L UF.  Receiving dialysis today, changed to 3K bath and goal reduced to 2.5 to 3 L as tolerated.  Patient is off of nitro drip.  He is off of BiPAP.  Patient was noncompliant with full dialysis treatment as outpatient.  # Anemia: Hemoglobin 7.8.  Iron saturation low elevated ferritin level.  Hold off on iron because of ongoing infection.  Received Mircera on 1/29.  # Secondary hyperparathyroidism: Monitor labs, phos 6.2.  Start phoslo.  #Hypertensive urgency: Blood pressure improved.  Off nitro.  Resume home medication.  Dialysis today.  Subjective: Seen and examined in ICU.  He feels much better.  He is off of nitro and BiPAP today.  Receiving regular dialysis today.   Objective Vital signs in last 24 hours: Vitals:   06/08/18 0751 06/08/18 0800 06/08/18 0815 06/08/18 0830  BP:  134/84 123/86 98/83  Pulse:  97 89 89  Resp:  18 (!) 23 18  Temp: 98.4 F (36.9 C)     TempSrc: Oral     SpO2:  98%    Weight:      Height:       Weight change: 9.188 kg  Intake/Output Summary (Last 24 hours) at 06/08/2018 0914 Last data filed at 06/08/2018 0900 Gross per 24 hour  Intake 1658.84 ml  Output 3000 ml  Net -1341.16 ml       Labs: Basic Metabolic Panel: Recent Labs  Lab 06/07/18 0023 06/07/18 0612 06/07/18 0733 06/08/18 0504  NA 136 138  --  133*  K 7.1* 5.3*  --  3.6  CL 94* 91*  --  87*  CO2 26 25  --  25  GLUCOSE 106* 122*  --  159*  BUN 59* 67*  --  36*  CREATININE 11.40* 12.66*   --  8.36*  CALCIUM 8.1* 8.1*  --  7.8*  PHOS 4.8*  --  5.7* 6.2*   Liver Function Tests: Recent Labs  Lab 06/06/18 1445 06/07/18 0023 06/07/18 0733 06/08/18 0504  AST 371*  --   --   --   ALT 489*  --   --   --   ALKPHOS 138*  --   --   --   BILITOT 1.8*  --   --   --   PROT 8.7*  --   --   --   ALBUMIN 4.1 3.6 3.6 3.0*   No results for input(s): LIPASE, AMYLASE in the last 168 hours. No results for input(s): AMMONIA in the last 168 hours. CBC: Recent Labs  Lab 06/06/18 1445  06/06/18 2128 06/06/18 2221 06/07/18 0612 06/08/18 0504  WBC 13.7*  --  17.0*  --  12.7* 13.8*  NEUTROABS 12.3*  --   --   --   --   --   HGB 8.1*   < > 7.9* 8.5* 7.8* 7.8*  HCT 28.3*   < > 25.4* 25.0* 26.5* 25.2*  MCV 83.5  --  81.7  --  81.3  81.0  PLT 295  --  247  --  231 259   < > = values in this interval not displayed.   Cardiac Enzymes: Recent Labs  Lab 06/06/18 2128 06/07/18 0023 06/07/18 0612  TROPONINI 0.25* 0.32* 0.57*   CBG: No results for input(s): GLUCAP in the last 168 hours.  Iron Studies:  Recent Labs    06/08/18 0504  IRON 20*  TIBC 230*  FERRITIN 1,378*   Studies/Results: Dg Chest Port 1 View  Result Date: 06/08/2018 CLINICAL DATA:  Acute respiratory failure.  Fever. EXAM: PORTABLE CHEST 1 VIEW COMPARISON:  Yesterday FINDINGS: Worsening aeration at the right base primarily related to the middle lobe. There is similar streaky density behind the heart. Cardiomegaly. Spondylosis. IMPRESSION: 1. Worsening aeration at the right base, likely pneumonia in this clinical setting. There is also a degree of volume loss. 2. Cardiomegaly. Electronically Signed   By: Monte Fantasia M.D.   On: 06/08/2018 08:36   Dg Chest Port 1 View  Result Date: 06/07/2018 CLINICAL DATA:  Shortness of breath and hypoxia today. History of CHF. EXAM: PORTABLE CHEST 1 VIEW COMPARISON:  Chest radiograph June 10, 2018 FINDINGS: Stable cardiomegaly. Pulmonary vascular congestion without pleural  effusion or focal consolidation. No pneumothorax. Soft tissue planes and included osseous structures are non suspicious. IMPRESSION: 1. Stable cardiomegaly and pulmonary vascular congestion. Electronically Signed   By: Elon Alas M.D.   On: 06/07/2018 04:26   Dg Chest Port 1 View  Result Date: 06/06/2018 CLINICAL DATA:  Chest pain and shortness of breath. Chronic renal failure EXAM: PORTABLE CHEST 1 VIEW COMPARISON:  April 07, 2016 FINDINGS: There is cardiomegaly. Pulmonary vascularity is within normal limits. There is ill-defined opacity in the left mid lung. The lungs elsewhere are clear. No adenopathy evident. No bone lesions. IMPRESSION: Probable pneumonia left mid lung region. Lungs elsewhere appear clear. Stable cardiomegaly. No adenopathy evident. Followup PA and lateral chest radiographs recommended in 3-4 weeks following trial of antibiotic therapy to ensure resolution and exclude underlying malignancy. Electronically Signed   By: Lowella Grip III M.D.   On: 06/06/2018 15:15   Vas [place] Lower Extremity Venous (dvt)  Result Date: 06/07/2018  Lower Venous Study Indications: Vte.  Performing Technologist: Abram Sander RVS  Examination Guidelines: A complete evaluation includes B-mode imaging, spectral Doppler, color Doppler, and power Doppler as needed of all accessible portions of each vessel. Bilateral testing is considered an integral part of a complete examination. Limited examinations for reoccurring indications may be performed as noted.  Right Venous Findings: +---------+---------------+---------+-----------+----------+-------+          CompressibilityPhasicitySpontaneityPropertiesSummary +---------+---------------+---------+-----------+----------+-------+ CFV      Full           Yes      Yes                          +---------+---------------+---------+-----------+----------+-------+ SFJ      Full                                                  +---------+---------------+---------+-----------+----------+-------+ FV Prox  Full                                                 +---------+---------------+---------+-----------+----------+-------+  FV Mid   Full                                                 +---------+---------------+---------+-----------+----------+-------+ FV DistalFull                                                 +---------+---------------+---------+-----------+----------+-------+ PFV      Full                                                 +---------+---------------+---------+-----------+----------+-------+ POP      Full           Yes      Yes                          +---------+---------------+---------+-----------+----------+-------+ PTV      Full                                                 +---------+---------------+---------+-----------+----------+-------+ PERO     Full                                                 +---------+---------------+---------+-----------+----------+-------+  Left Venous Findings: +---------+---------------+---------+-----------+----------+-------+          CompressibilityPhasicitySpontaneityPropertiesSummary +---------+---------------+---------+-----------+----------+-------+ CFV      Full           Yes      Yes                          +---------+---------------+---------+-----------+----------+-------+ SFJ      Full                                                 +---------+---------------+---------+-----------+----------+-------+ FV Prox  Full                                                 +---------+---------------+---------+-----------+----------+-------+ FV Mid   Full                                                 +---------+---------------+---------+-----------+----------+-------+ FV DistalFull                                                  +---------+---------------+---------+-----------+----------+-------+  PFV      Full                                                 +---------+---------------+---------+-----------+----------+-------+ POP      Full           Yes      Yes                          +---------+---------------+---------+-----------+----------+-------+ PTV      Full                                                 +---------+---------------+---------+-----------+----------+-------+ PERO     Full                                                 +---------+---------------+---------+-----------+----------+-------+    Summary: Right: There is no evidence of deep vein thrombosis in the lower extremity. No cystic structure found in the popliteal fossa. Left: There is no evidence of deep vein thrombosis in the lower extremity. No cystic structure found in the popliteal fossa.  *See table(s) above for measurements and observations. Electronically signed by Ruta Hinds MD on 06/07/2018 at 10:21:32 AM.    Final     Medications: Infusions: . sodium chloride    . sodium chloride    . sodium chloride 10 mL/hr at 06/08/18 0900  . ceFEPime (MAXIPIME) IV    . heparin 1,200 Units/hr (06/08/18 0900)  . nitroGLYCERIN Stopped (06/08/18 0656)  . vancomycin      Scheduled Medications: . acetaminophen  1,000 mg Oral Once  . carvedilol  25 mg Oral BID WC  . Chlorhexidine Gluconate Cloth  6 each Topical Q0600  . hydrALAZINE  100 mg Oral Q8H  . labetalol  10 mg Intravenous Once    have reviewed scheduled and prn medications.  Physical Exam: General: Lying in bed, not in distress Heart:RRR, s1s2 nl, no rubs Lungs: Clear bilateral, no wheezing Abdomen:soft, Non-tender, non-distended Extremities:No edema Dialysis Access: AV fistula has a good thrill and bruit.  Lakya Schrupp Prasad Manoj Enriquez 06/08/2018,9:14 AM  LOS: 2 days

## 2018-06-08 NOTE — Progress Notes (Signed)
PULMONARY / CRITICAL CARE MEDICINE   NAME:  Cameron [name], MRN:  [id number], DOB:  Mar 07, 1971, LOS: 2 ADMISSION DATE:  06/06/2018, CONSULTATION DATE:  06/08/2018  REFERRING MD:  Junious Silk, ED, CHIEF COMPLAINT:  resp distress on bipap  BRIEF HISTORY:    48 year old man with ESRD on dialysis, admitted from dialysis with flulike illness and respiratory distress requiring BiPAP  HISTORY OF PRESENT ILLNESS   49 year old man with ESRD on dialysis Monday/Wednesday/Friday.  He admits that he is noncompliant and does not complete his treatments fully.  He has been sick for about a week but fevers and a dry cough and he has been self-medicating with NyQuil.  He went for dialysis last week but did not complete it.  He feels that he is up on his fluid.  Finished 2 hours 27 minutes of dialysis today and then felt short of breath and arrived via EMS to the ED. he was noted to be febrile to 102, tachypneic with accessory muscle usage and tachycardic with a rhonchi bilateral.  Placed on BiPAP and nitroglycerin and transferred to ICU.   SIGNIFICANT PAST MEDICAL HISTORY    Past Medical History:  Diagnosis Date  . CHF (congestive heart failure) ([place])   . Hypertension   . Meralgia paraesthetica, right 02/11/2017  . Renal disorder    dialysis MWF    SIGNIFICANT EVENTS:    STUDIES:   EKG 2/3 >> sinus tach , s1 Q3 pattern  Lower extremity ultrasound 2/4-negative for PE.  X-ray 2/4- Cardiomegaly, pulmonary vascular congestion. X-ray 2/5-cardiomegaly, right base opacity worsening.  CULTURES:  2/3 RVP >> 2/3 Flu PCR >> neg 2/3 blood cultures >> NGTD  ANTIBIOTICS:  Cefepime 2/3 >> Vanc 2/3 >>  LINES/TUBES:    CONSULTANTS:   SUBJECTIVE:  Off BiPAP and nitroglycerin, blood pressure is improved No resp distress  CONSTITUTIONAL: BP 98/61   Pulse 91   Temp 98.3 F (36.8 C) (Oral)   Resp 19   Ht 5\' 4"  (1.626 m)   Wt 82.5 kg   SpO2 99%   BMI 31.22 kg/m   I/O last 3 completed shifts: In:  2669.9 [P.O.:390; I.V.:1079.1; Other:250; IV Piggyback:950.8] Out: 3000 [Other:3000]     PHYSICAL EXAM: Gen:      No acute distress HEENT:  EOMI, sclera anicteric Neck:     No masses; no thyromegaly Lungs:    Clear to auscultation bilaterally; normal respiratory effort CV:         Regular rate and rhythm; no murmurs Abd:      + bowel sounds; soft, non-tender; no palpable masses, no distension Ext:    No edema; adequate peripheral perfusion Skin:      Warm and dry; no rash Neuro: alert and oriented x 3 Psych: normal mood and affect  RESOLVED PROBLEM LIST   ASSESSMENT AND PLAN   48 year old end-stage renal disease, noncompliant with dialysis therapy admitted with hypoxic respiratory failure, hypertensive emergency, flulike illness Being treated for possible PE, HCAP, volume overload with pulmonary edema  Respiratory failure, pneumonia Off BiPAP and on room air Continue cefepime, Vanco Follow cultures Suspicion for PE is low.  Will obtain a CT angiogram.  If negative then stop heparin.  Elevated troponins, history of cardiomyopathy Hypertensive emergency Continue PO hydralazine, Coreg Hydralazine PRN.  End-stage renal disease Dialysis per nephrology.  LABS  Glucose No results for input(s): GLUCAP in the last 168 hours.  BMET Recent Labs  Lab 06/07/18 0023 06/07/18 0612 06/08/18 0504  NA 136 138 133*  K  7.1* 5.3* 3.6  CL 94* 91* 87*  CO2 26 25 25   BUN 59* 67* 36*  CREATININE 11.40* 12.66* 8.36*  GLUCOSE 106* 122* 159*    Liver Enzymes Recent Labs  Lab 06/06/18 1445 06/07/18 0023 06/07/18 0733 06/08/18 0504  AST 371*  --   --   --   ALT 489*  --   --   --   ALKPHOS 138*  --   --   --   BILITOT 1.8*  --   --   --   ALBUMIN 4.1 3.6 3.6 3.0*    Electrolytes Recent Labs  Lab 06/07/18 0023 06/07/18 0612 06/07/18 0733 06/08/18 0504  CALCIUM 8.1* 8.1*  --  7.8*  MG  --   --   --  2.1  PHOS 4.8*  --  5.7* 6.2*    CBC Recent Labs  Lab  06/06/18 2128 06/06/18 2221 06/07/18 0612 06/08/18 0504  WBC 17.0*  --  12.7* 13.8*  HGB 7.9* 8.5* 7.8* 7.8*  HCT 25.4* 25.0* 26.5* 25.2*  PLT 247  --  231 259    ABG Recent Labs  Lab 06/06/18 1604 06/06/18 2221  PHART 7.620* 7.585*  PCO2ART 28.3* 29.3*  PO2ART 54.0* 127.0*    Coag's Recent Labs  Lab 06/06/18 1445  INR 1.17    Sepsis Markers Recent Labs  Lab 06/06/18 1549 06/06/18 1737 06/07/18 1141  LATICACIDVEN 4.7* 4.1* 2.1*    Cardiac Enzymes Recent Labs  Lab 06/06/18 2128 06/07/18 0023 06/07/18 0612  TROPONINI 0.25* 0.32* 0.57*   Marshell Garfinkel MD [facility] Pager [phone] If no answer call [phone] 06/08/2018, 12:32 PM

## 2018-06-09 ENCOUNTER — Telehealth: Payer: Self-pay

## 2018-06-09 ENCOUNTER — Inpatient Hospital Stay (HOSPITAL_COMMUNITY): Payer: Medicare Other

## 2018-06-09 LAB — TROPONIN I: Troponin I: 0.38 ng/mL (ref <0.03)

## 2018-06-09 LAB — BASIC METABOLIC PANEL
Anion gap: 15 (ref 5–15)
BUN: 33 mg/dL — AB (ref 6–20)
CO2: 25 mmol/L (ref 22–32)
Calcium: 8.1 mg/dL — ABNORMAL LOW (ref 8.9–10.3)
Chloride: 94 mmol/L — ABNORMAL LOW (ref 98–111)
Creatinine, Ser: 6.76 mg/dL — ABNORMAL HIGH (ref 0.61–1.24)
GFR calc Af Amer: 10 mL/min — ABNORMAL LOW (ref >60)
GFR calc non Af Amer: 9 mL/min — ABNORMAL LOW (ref >60)
Glucose, Bld: 109 mg/dL — ABNORMAL HIGH (ref 70–99)
Potassium: 3.7 mmol/L (ref 3.5–5.1)
Sodium: 134 mmol/L — ABNORMAL LOW (ref 135–145)

## 2018-06-09 LAB — CBC
HCT: 26.6 % — ABNORMAL LOW (ref 39.0–52.0)
Hemoglobin: 8.1 g/dL — ABNORMAL LOW (ref 13.0–17.0)
MCH: 24.8 pg — ABNORMAL LOW (ref 26.0–34.0)
MCHC: 30.5 g/dL (ref 30.0–36.0)
MCV: 81.3 fL (ref 80.0–100.0)
Platelets: 262 10*3/uL (ref 150–400)
RBC: 3.27 MIL/uL — ABNORMAL LOW (ref 4.22–5.81)
RDW: 16.7 % — AB (ref 11.5–15.5)
WBC: 12.4 10*3/uL — ABNORMAL HIGH (ref 4.0–10.5)
nRBC: 0.8 % — ABNORMAL HIGH (ref 0.0–0.2)

## 2018-06-09 LAB — PHOSPHORUS: Phosphorus: 3.7 mg/dL (ref 2.5–4.6)

## 2018-06-09 LAB — MAGNESIUM: Magnesium: 2.2 mg/dL (ref 1.7–2.4)

## 2018-06-09 LAB — HEPARIN LEVEL (UNFRACTIONATED): Heparin Unfractionated: <0.1 IU/mL — ABNORMAL LOW (ref 0.30–0.70)

## 2018-06-09 MED ORDER — GUAIFENESIN ER 600 MG PO TB12: 1200.0000 mg | ORAL_TABLET | Freq: Two times a day (BID) | ORAL | Status: DC

## 2018-06-09 MED ORDER — AMOXICILLIN-POT CLAVULANATE 500-125 MG PO TABS: 1.0000 | ORAL_TABLET | Freq: Every day | ORAL | 0 refills | Status: AC

## 2018-06-09 MED ORDER — AMOXICILLIN-POT CLAVULANATE 500-125 MG PO TABS: 1.0000 | ORAL_TABLET | ORAL | Status: DC

## 2018-06-09 NOTE — Progress Notes (Signed)
PULMONARY / CRITICAL CARE MEDICINE   NAME:  Cameron [name], MRN:  [id number], DOB:  03/16/1971, LOS: 3 ADMISSION DATE:  06/06/2018, CONSULTATION DATE:  06/09/2018  REFERRING MD:  Junious Silk, ED, CHIEF COMPLAINT:  resp distress on bipap  BRIEF HISTORY:    48 year old man with ESRD on dialysis, admitted from dialysis with flulike illness and respiratory distress requiring BiPAP  HISTORY OF PRESENT ILLNESS   48 year old man with ESRD on dialysis Monday/Wednesday/Friday.  He admits that he is noncompliant and does not complete his treatments fully.  He has been sick for about a week but fevers and a dry cough and he has been self-medicating with NyQuil.  He went for dialysis last week but did not complete it.  He feels that he is up on his fluid.  Finished 2 hours 27 minutes of dialysis today and then felt short of breath and arrived via EMS to the ED. he was noted to be febrile to 102, tachypneic with accessory muscle usage and tachycardic with a rhonchi bilateral.  Placed on BiPAP and nitroglycerin and transferred to ICU.   SIGNIFICANT PAST MEDICAL HISTORY    Past Medical History:  Diagnosis Date  . CHF (congestive heart failure) ([place])   . Hypertension   . Meralgia paraesthetica, right 02/11/2017  . Renal disorder    dialysis MWF    SIGNIFICANT EVENTS:    STUDIES:   EKG 2/3 >> sinus tach , s1 Q3 pattern  Lower extremity ultrasound 2/4-negative for PE.  X-ray 2/4- Cardiomegaly, pulmonary vascular congestion. X-ray 2/5-cardiomegaly, right base opacity worsening.  CULTURES:  2/3 RVP >> neg 2/3 Flu PCR >> neg 2/3 blood cultures >> NGTD  ANTIBIOTICS:  Cefepime 2/3 >>2/6 Vanc 2/3 >>2/6 Augmentin 2/6>   LINES/TUBES:    CONSULTANTS:   SUBJECTIVE:  Reports "feeling really good" and states "Ready to get out of here and go home."  HDS with no respiratory distress.   CONSTITUTIONAL: BP (!) 142/70   Pulse 91   Temp 99.5 F (37.5 C) (Oral)   Resp (!) 23   Ht 5\' 4"  (1.626 m)    Wt 83.2 kg   SpO2 90%   BMI 31.48 kg/m   I/O last 3 completed shifts: In: 1936.6 [P.O.:778; I.V.:608.5; Other:250; IV Piggyback:300.1] Out: 1211 [Other:1211]     PHYSICAL EXAM: Gen:      WDWN adult male NAD HEENT:  NCAT mmm anicteric sclera Lungs:    CTA B no accessory muscle recruitment  CV:         RRR s1s2 no r/g/m Abd:      Soft, round, ndnt, normoactive x4 Ext:    Symmetrical bulk and tone, capillary refill < 3 seconds  Skin:      Clean, warm, dry, intact Neuro: AAOx4 following commands  Psych: normal mood and affect  RESOLVED PROBLEM LIST   Respiratory failure Hypertensive emergency  ASSESSMENT AND PLAN   48 year old end-stage renal disease, noncompliant with dialysis therapy admitted with hypoxic respiratory failure, hypertensive emergency, flulike illness Being treated for possible PE, HCAP, volume overload with pulmonary edema  Pneumonia -acute respiratory failure resolved Remains off bipap and on room air Transitioning Vanc/cef to augmentin  Elevated troponins, history of cardiomyopathy Hypertensive emergency, resolved Resuming home meds for discharge   End-stage renal disease Inpatient nephrology per nephrology Will resume outpatient dialysis course per nephrology   Dispo: Patient's acute respiratory failure and hypertensive emergency are resolved. He is hemodynamically stable and in no acute distress.  He is planned for discharge today,  with scheduled follow up with IM.  LABS  Glucose No results for input(s): GLUCAP in the last 168 hours.  BMET Recent Labs  Lab 06/07/18 0612 06/08/18 0504 06/09/18 0619  NA 138 133* 134*  K 5.3* 3.6 3.7  CL 91* 87* 94*  CO2 25 25 25   BUN 67* 36* 33*  CREATININE 12.66* 8.36* 6.76*  GLUCOSE 122* 159* 109*    Liver Enzymes Recent Labs  Lab 06/06/18 1445 06/07/18 0023 06/07/18 0733 06/08/18 0504  AST 371*  --   --   --   ALT 489*  --   --   --   ALKPHOS 138*  --   --   --   BILITOT 1.8*  --   --   --    ALBUMIN 4.1 3.6 3.6 3.0*    Electrolytes Recent Labs  Lab 06/07/18 0612 06/07/18 0733 06/08/18 0504 06/09/18 0619  CALCIUM 8.1*  --  7.8* 8.1*  MG  --   --  2.1 2.2  PHOS  --  5.7* 6.2* 3.7    CBC Recent Labs  Lab 06/07/18 0612 06/08/18 0504 06/09/18 0619  WBC 12.7* 13.8* 12.4*  HGB 7.8* 7.8* 8.1*  HCT 26.5* 25.2* 26.6*  PLT 231 259 262    ABG Recent Labs  Lab 06/06/18 1604 06/06/18 2221  PHART 7.620* 7.585*  PCO2ART 28.3* 29.3*  PO2ART 54.0* 127.0*    Coag's Recent Labs  Lab 06/06/18 1445  INR 1.17    Sepsis Markers Recent Labs  Lab 06/06/18 1549 06/06/18 1737 06/07/18 1141  LATICACIDVEN 4.7* 4.1* 2.1*    Cardiac Enzymes Recent Labs  Lab 06/07/18 0612 06/08/18 1305 06/09/18 0619  TROPONINI 0.57* 0.35* 0.38*   Eliseo Gum MSN, AGACNP-BC [place] 06/09/2018, 9:16 AM

## 2018-06-09 NOTE — Discharge Summary (Signed)
Physician Discharge Summary  Patient ID: Cameron [name] MRN: [id number] DOB/AGE: 46-Nov-1972 46 y.o.  Admit date: 06/06/2018 Discharge date: 06/09/2018  Problem List Active Problems:   Acute respiratory failure [facility])  HPI: 48 year old man with ESRD on dialysis Monday/Wednesday/Friday.  He admits that he is noncompliant and does not complete his treatments fully.  He has been sick for about a week but fevers and a dry cough and he has been self-medicating with NyQuil.  He went for dialysis last week but did not complete it.  He feels that he is up on his fluid.  Finished 2 hours 27 minutes of dialysis today and then felt short of breath and arrived via EMS to the ED. he was noted to be febrile to 102, tachypneic with accessory muscle usage and tachycardic with a rhonchi bilateral.  Placed on BiPAP but respiratory rate remained high 40s Chest x-ray showed left mid zone infiltrate. Labs showed severe respiratory/metabolic  Hospital Course:  Reports "feeling really good" and states "Ready to get out of here and go home."  HDS with no respiratory distress.   CONSTITUTIONAL: BP (!) 142/70   Pulse 91   Temp 99.5 F (37.5 C) (Oral)   Resp (!) 23   Ht 5\' 4"  (1.626 m)   Wt 83.2 kg   SpO2 90%   BMI 31.48 kg/m   I/O last 3 completed shifts: In: 1936.6 [P.O.:778; I.V.:608.5; Other:250; IV Piggyback:300.1] Out: 1211 [Other:1211] PHYSICAL EXAM: Gen:       WDWN adult male NAD HEENT:  NCAT mmm anicteric sclera Lungs:    CTA B no accessory muscle recruitment  CV:         RRR s1s2 no r/g/m Abd:        Soft, round, ndnt, normoactive x4 Ext:         Symmetrical bulk and tone, capillary refill < 3 seconds  Skin:      Clean, warm, dry, intact Neuro: AAOx4 following commands  Psych: normal mood and affect  RESOLVED PROBLEM LIST   Respiratory failure Hypertensive emergency  ASSESSMENT AND PLAN   48 year old end-stage renal disease, noncompliant with dialysis therapy admitted with hypoxic  respiratory failure, hypertensive emergency, flulike illness Being treated for possible HCAP, volume overload with pulmonary edema  Pneumonia -acute respiratory failure resolved -RVP negative Remains off bipap and is stable on room air Transitioning Vanc/cef to augmentin  Elevated troponins, history of cardiomyopathy Hypertensive emergency, resolved Resuming home meds for discharge   End-stage renal disease Inpatient nephrology per nephrology Will resume outpatient dialysis course per nephrology   Dispo: Patient's acute respiratory failure and hypertensive emergency are resolved. He is hemodynamically stable and in no acute distress.  He is planned for discharge today, with scheduled follow up with IM.    Labs at discharge Lab Results  Component Value Date   CREATININE 6.76 (H) 06/09/2018   BUN 33 (H) 06/09/2018   NA 134 (L) 06/09/2018   K 3.7 06/09/2018   CL 94 (L) 06/09/2018   CO2 25 06/09/2018   Lab Results  Component Value Date   WBC 12.4 (H) 06/09/2018   HGB 8.1 (L) 06/09/2018   HCT 26.6 (L) 06/09/2018   MCV 81.3 06/09/2018   PLT 262 06/09/2018   Lab Results  Component Value Date   ALT 489 (H) 06/06/2018   AST 371 (H) 06/06/2018   ALKPHOS 138 (H) 06/06/2018   BILITOT 1.8 (H) 06/06/2018   Lab Results  Component Value Date   INR 1.17 06/06/2018  Current radiology studies Ct Angio Chest Pe W Or Wo Contrast  Result Date: 06/08/2018 CLINICAL DATA:  Shortness of breath, fever, and nonproductive cough. End-stage renal disease on dialysis. EXAM: CT ANGIOGRAPHY CHEST WITH CONTRAST TECHNIQUE: Multidetector CT imaging of the chest was performed using the standard protocol during bolus administration of intravenous contrast. Multiplanar CT image reconstructions and MIPs were obtained to evaluate the vascular anatomy. CONTRAST:  164mL ISOVUE-370 IOPAMIDOL (ISOVUE-370) INJECTION 76% COMPARISON:  Chest radiograph to 520 FINDINGS: Cardiovascular: Pulmonary arterial  opacification is adequate without emboli identified to the segmental level allowing for mild respiratory motion artifact. The thoracic aorta is normal in caliber. There is moderate to severe cardiomegaly. There is no significant pericardial effusion. Coronary artery calcification is noted Mediastinum/Nodes: Mildly prominent number of small/normal sized mediastinal lymph nodes. No enlarged axillary or hilar lymph nodes. Unremarkable thyroid and esophagus. Lungs/Pleura: No pleural effusion or pneumothorax. Atelectasis involving the lateral segment of the right middle lobe. Mild dependent atelectasis in the left greater than right lower lobes. No mass. Upper Abdomen: Bilateral renal atrophy. 1.6 cm low-density lesion in the upper pole of the left kidney, likely a cyst. Musculoskeletal: Bilateral gynecomastia. No acute osseous abnormality or suspicious osseous lesion. Review of the MIP images confirms the above findings. IMPRESSION: 1. No evidence of pulmonary emboli. 2. Atelectasis in the right middle lobe and both lower lobes. 3. Cardiomegaly and coronary artery atherosclerosis. Electronically Signed   By: Logan Bores M.D.   On: 06/08/2018 14:15   Dg Chest Port 1 View  Result Date: 06/09/2018 CLINICAL DATA:  Acute respiratory failure.  Short of breath EXAM: PORTABLE CHEST 1 VIEW COMPARISON:  06/08/2018 FINDINGS: Marked cardiac enlargement. Mild vascular congestion without edema. Minimal left effusion. Mild bibasilar atelectasis/infiltrate unchanged. IMPRESSION: Marked cardiac enlargement with mild vascular congestion. Negative for edema. Mild bibasilar atelectasis. Slight progression and vascular congestion since yesterday. Electronically Signed   By: Franchot Gallo M.D.   On: 06/09/2018 06:57   Dg Chest Port 1 View  Result Date: 06/08/2018 CLINICAL DATA:  Acute respiratory failure.  Fever. EXAM: PORTABLE CHEST 1 VIEW COMPARISON:  Yesterday FINDINGS: Worsening aeration at the right base primarily related to the  middle lobe. There is similar streaky density behind the heart. Cardiomegaly. Spondylosis. IMPRESSION: 1. Worsening aeration at the right base, likely pneumonia in this clinical setting. There is also a degree of volume loss. 2. Cardiomegaly. Electronically Signed   By: Monte Fantasia M.D.   On: 06/08/2018 08:36   Vas [place] Lower Extremity Venous (dvt)  Result Date: 06/07/2018  Lower Venous Study Indications: Vte.  Performing Technologist: Abram Sander RVS  Examination Guidelines: A complete evaluation includes B-mode imaging, spectral Doppler, color Doppler, and power Doppler as needed of all accessible portions of each vessel. Bilateral testing is considered an integral part of a complete examination. Limited examinations for reoccurring indications may be performed as noted.  Right Venous Findings: +---------+---------------+---------+-----------+----------+-------+          CompressibilityPhasicitySpontaneityPropertiesSummary +---------+---------------+---------+-----------+----------+-------+ CFV      Full           Yes      Yes                          +---------+---------------+---------+-----------+----------+-------+ SFJ      Full                                                 +---------+---------------+---------+-----------+----------+-------+  FV Prox  Full                                                 +---------+---------------+---------+-----------+----------+-------+ FV Mid   Full                                                 +---------+---------------+---------+-----------+----------+-------+ FV DistalFull                                                 +---------+---------------+---------+-----------+----------+-------+ PFV      Full                                                 +---------+---------------+---------+-----------+----------+-------+ POP      Full           Yes      Yes                           +---------+---------------+---------+-----------+----------+-------+ PTV      Full                                                 +---------+---------------+---------+-----------+----------+-------+ PERO     Full                                                 +---------+---------------+---------+-----------+----------+-------+  Left Venous Findings: +---------+---------------+---------+-----------+----------+-------+          CompressibilityPhasicitySpontaneityPropertiesSummary +---------+---------------+---------+-----------+----------+-------+ CFV      Full           Yes      Yes                          +---------+---------------+---------+-----------+----------+-------+ SFJ      Full                                                 +---------+---------------+---------+-----------+----------+-------+ FV Prox  Full                                                 +---------+---------------+---------+-----------+----------+-------+ FV Mid   Full                                                 +---------+---------------+---------+-----------+----------+-------+  FV DistalFull                                                 +---------+---------------+---------+-----------+----------+-------+ PFV      Full                                                 +---------+---------------+---------+-----------+----------+-------+ POP      Full           Yes      Yes                          +---------+---------------+---------+-----------+----------+-------+ PTV      Full                                                 +---------+---------------+---------+-----------+----------+-------+ PERO     Full                                                 +---------+---------------+---------+-----------+----------+-------+    Summary: Right: There is no evidence of deep vein thrombosis in the lower extremity. No cystic structure found in the popliteal  fossa. Left: There is no evidence of deep vein thrombosis in the lower extremity. No cystic structure found in the popliteal fossa.  *See table(s) above for measurements and observations. Electronically signed by Ruta Hinds MD on 06/07/2018 at 10:21:32 AM.    Final     Disposition:  Discharge disposition: 01-Home or Self Care        Allergies as of 06/09/2018      Reactions   No Known Allergies       Medication List    TAKE these medications   amoxicillin-clavulanate 500-125 MG tablet Commonly known as:  AUGMENTIN Take 1 tablet (500 mg total) by mouth daily.   calcitRIOL 0.5 MCG capsule Commonly known as:  ROCALTROL Take 2 capsules (1 mcg total) by mouth every other day.   calcium acetate 667 MG capsule Commonly known as:  PHOSLO Take 3 capsules (2,001 mg total) by mouth 3 (three) times daily with meals.   carbamazepine 200 MG tablet Commonly known as:  TEGRETOL Take 1 tablet (200 mg total) by mouth 2 (two) times daily.   carvedilol 25 MG tablet Commonly known as:  COREG Take 1 tablet (25 mg total) by mouth 2 (two) times daily with a meal.   cinacalcet 30 MG tablet Commonly known as:  SENSIPAR Take 2 tablets (60 mg total) by mouth daily with supper.   cloNIDine 0.3 MG tablet Commonly known as:  CATAPRES Take 0.3 mg by mouth daily.   gabapentin 300 MG capsule Commonly known as:  NEURONTIN Take 1 capsule (300 mg total) by mouth at bedtime.   hydrALAZINE 100 MG tablet Commonly known as:  APRESOLINE Take 1 tablet (100 mg total) by mouth 3 (three) times daily.   ibuprofen 200 MG tablet Commonly known as:  ADVIL,MOTRIN Take 200 mg  by mouth every 6 (six) hours as needed for moderate pain.      Follow-up Information    [facility] Go on 06/13/2018.   Why:  at 2:15pm Contact information: [address] [place] [phone]           Discharged Condition: good  Time spent on discharge 38 min  Vital  signs at Discharge. Temp:  [98.1 F (36.7 C)-100.1 F (37.8 C)] 99.5 F (37.5 C) (02/06 0812) Pulse Rate:  [84-98] 91 (02/06 0700) Resp:  [14-35] 23 (02/06 0700) BP: (68-142)/(46-102) 142/70 (02/06 0700) SpO2:  [89 %-100 %] 90 % (02/06 0700) Weight:  [82.5 kg-83.2 kg] 83.2 kg (02/06 0500) Office follow up Special Information or instructions.  Follow up with internal medicine clinic.  No follow up with PCCM.   Signed: Eliseo Gum MSN, AGACNP-BC [facility] 06/09/2018, 9:39 AM

## 2018-06-09 NOTE — Progress Notes (Signed)
[name] NEPHROLOGY PROGRESS NOTE  Assessment/ Plan: Pt is a 48 y.o. yo male ESRD on HD MWF, admitted with respiratory distress, fever. Dialyzes at [facility] 4 hrs  EDW 85.5 kg 2K, 2.5 ca, URR 77, LUA AVF, mircera 225 mcg and IV iron 100 mg last dose on 06/01/2018.PTH 1120  #Fever, respiratory distress: Received 2 dialysis treatment.  Treated with BiPAP and antibiotics.  Discharged with oral Augmentin by pulmonary team.  He is clinically improved.  On room air today.  # ESRD: MWF schedule, received extra treatment.  Clinically improved.  Need to adjust his dry weight.  Advised adherence with full dialysis treatment as outpatient.  Next treatment tomorrow.  # Anemia: Continue IV iron and Mircera as outpatient.  Received Mircera on 1/29.  # Secondary hyperparathyroidism: Monitor labs. Phos acceptable.  #Hypertensive urgency: Blood pressure improved.  Resume home medication.   Subjective: Seen and examined.  On room air.  Lying in bed comfortable, no shortness of breath or chest pain.  Clinically improved.  Ready to go home today. Objective Vital signs in last 24 hours: Vitals:   06/09/18 0700 06/09/18 0800 06/09/18 0812 06/09/18 0900  BP: (!) 142/70 (!) 154/86    Pulse: 91 92  93  Resp: (!) 23 (!) 25  (!) 22  Temp:   99.5 F (37.5 C)   TempSrc:   Oral   SpO2: 90% 91%  100%  Weight:      Height:       Weight change: -1.9 kg  Intake/Output Summary (Last 24 hours) at 06/09/2018 1011 Last data filed at 06/09/2018 0700 Gross per 24 hour  Intake 1028.29 ml  Output 1211 ml  Net -182.71 ml       Labs: Basic Metabolic Panel: Recent Labs  Lab 06/07/18 0612 06/07/18 0733 06/08/18 0504 06/09/18 0619  NA 138  --  133* 134*  K 5.3*  --  3.6 3.7  CL 91*  --  87* 94*  CO2 25  --  25 25  GLUCOSE 122*  --  159* 109*  BUN 67*  --  36* 33*  CREATININE 12.66*  --  8.36* 6.76*  CALCIUM 8.1*  --  7.8* 8.1*  PHOS  --  5.7* 6.2* 3.7   Liver Function Tests: Recent Labs   Lab 06/06/18 1445 06/07/18 0023 06/07/18 0733 06/08/18 0504  AST 371*  --   --   --   ALT 489*  --   --   --   ALKPHOS 138*  --   --   --   BILITOT 1.8*  --   --   --   PROT 8.7*  --   --   --   ALBUMIN 4.1 3.6 3.6 3.0*   No results for input(s): LIPASE, AMYLASE in the last 168 hours. No results for input(s): AMMONIA in the last 168 hours. CBC: Recent Labs  Lab 06/06/18 1445  06/06/18 2128  06/07/18 0612 06/08/18 0504 06/09/18 0619  WBC 13.7*  --  17.0*  --  12.7* 13.8* 12.4*  NEUTROABS 12.3*  --   --   --   --   --   --   HGB 8.1*   < > 7.9*   < > 7.8* 7.8* 8.1*  HCT 28.3*   < > 25.4*   < > 26.5* 25.2* 26.6*  MCV 83.5  --  81.7  --  81.3 81.0 81.3  PLT 295  --  247  --  231 259  262   < > = values in this interval not displayed.   Cardiac Enzymes: Recent Labs  Lab 06/06/18 2128 06/07/18 0023 06/07/18 0612 06/08/18 1305 06/09/18 0619  TROPONINI 0.25* 0.32* 0.57* 0.35* 0.38*   CBG: No results for input(s): GLUCAP in the last 168 hours.  Iron Studies:  Recent Labs    06/08/18 0504  IRON 20*  TIBC 230*  FERRITIN 1,378*   Studies/Results: Ct Angio Chest Pe W Or Wo Contrast  Result Date: 06/08/2018 CLINICAL DATA:  Shortness of breath, fever, and nonproductive cough. End-stage renal disease on dialysis. EXAM: CT ANGIOGRAPHY CHEST WITH CONTRAST TECHNIQUE: Multidetector CT imaging of the chest was performed using the standard protocol during bolus administration of intravenous contrast. Multiplanar CT image reconstructions and MIPs were obtained to evaluate the vascular anatomy. CONTRAST:  137mL ISOVUE-370 IOPAMIDOL (ISOVUE-370) INJECTION 76% COMPARISON:  Chest radiograph to 520 FINDINGS: Cardiovascular: Pulmonary arterial opacification is adequate without emboli identified to the segmental level allowing for mild respiratory motion artifact. The thoracic aorta is normal in caliber. There is moderate to severe cardiomegaly. There is no significant pericardial effusion.  Coronary artery calcification is noted Mediastinum/Nodes: Mildly prominent number of small/normal sized mediastinal lymph nodes. No enlarged axillary or hilar lymph nodes. Unremarkable thyroid and esophagus. Lungs/Pleura: No pleural effusion or pneumothorax. Atelectasis involving the lateral segment of the right middle lobe. Mild dependent atelectasis in the left greater than right lower lobes. No mass. Upper Abdomen: Bilateral renal atrophy. 1.6 cm low-density lesion in the upper pole of the left kidney, likely a cyst. Musculoskeletal: Bilateral gynecomastia. No acute osseous abnormality or suspicious osseous lesion. Review of the MIP images confirms the above findings. IMPRESSION: 1. No evidence of pulmonary emboli. 2. Atelectasis in the right middle lobe and both lower lobes. 3. Cardiomegaly and coronary artery atherosclerosis. Electronically Signed   By: Logan Bores M.D.   On: 06/08/2018 14:15   Dg Chest Port 1 View  Result Date: 06/09/2018 CLINICAL DATA:  Acute respiratory failure.  Short of breath EXAM: PORTABLE CHEST 1 VIEW COMPARISON:  06/08/2018 FINDINGS: Marked cardiac enlargement. Mild vascular congestion without edema. Minimal left effusion. Mild bibasilar atelectasis/infiltrate unchanged. IMPRESSION: Marked cardiac enlargement with mild vascular congestion. Negative for edema. Mild bibasilar atelectasis. Slight progression and vascular congestion since yesterday. Electronically Signed   By: Franchot Gallo M.D.   On: 06/09/2018 06:57   Dg Chest Port 1 View  Result Date: 06/08/2018 CLINICAL DATA:  Acute respiratory failure.  Fever. EXAM: PORTABLE CHEST 1 VIEW COMPARISON:  Yesterday FINDINGS: Worsening aeration at the right base primarily related to the middle lobe. There is similar streaky density behind the heart. Cardiomegaly. Spondylosis. IMPRESSION: 1. Worsening aeration at the right base, likely pneumonia in this clinical setting. There is also a degree of volume loss. 2. Cardiomegaly.  Electronically Signed   By: Monte Fantasia M.D.   On: 06/08/2018 08:36   Vas [place] Lower Extremity Venous (dvt)  Result Date: 06/07/2018  Lower Venous Study Indications: Vte.  Performing Technologist: Abram Sander RVS  Examination Guidelines: A complete evaluation includes B-mode imaging, spectral Doppler, color Doppler, and power Doppler as needed of all accessible portions of each vessel. Bilateral testing is considered an integral part of a complete examination. Limited examinations for reoccurring indications may be performed as noted.  Right Venous Findings: +---------+---------------+---------+-----------+----------+-------+          CompressibilityPhasicitySpontaneityPropertiesSummary +---------+---------------+---------+-----------+----------+-------+ CFV      Full           Yes  Yes                          +---------+---------------+---------+-----------+----------+-------+ SFJ      Full                                                 +---------+---------------+---------+-----------+----------+-------+ FV Prox  Full                                                 +---------+---------------+---------+-----------+----------+-------+ FV Mid   Full                                                 +---------+---------------+---------+-----------+----------+-------+ FV DistalFull                                                 +---------+---------------+---------+-----------+----------+-------+ PFV      Full                                                 +---------+---------------+---------+-----------+----------+-------+ POP      Full           Yes      Yes                          +---------+---------------+---------+-----------+----------+-------+ PTV      Full                                                 +---------+---------------+---------+-----------+----------+-------+ PERO     Full                                                  +---------+---------------+---------+-----------+----------+-------+  Left Venous Findings: +---------+---------------+---------+-----------+----------+-------+          CompressibilityPhasicitySpontaneityPropertiesSummary +---------+---------------+---------+-----------+----------+-------+ CFV      Full           Yes      Yes                          +---------+---------------+---------+-----------+----------+-------+ SFJ      Full                                                 +---------+---------------+---------+-----------+----------+-------+ FV Prox  Full                                                 +---------+---------------+---------+-----------+----------+-------+  FV Mid   Full                                                 +---------+---------------+---------+-----------+----------+-------+ FV DistalFull                                                 +---------+---------------+---------+-----------+----------+-------+ PFV      Full                                                 +---------+---------------+---------+-----------+----------+-------+ POP      Full           Yes      Yes                          +---------+---------------+---------+-----------+----------+-------+ PTV      Full                                                 +---------+---------------+---------+-----------+----------+-------+ PERO     Full                                                 +---------+---------------+---------+-----------+----------+-------+    Summary: Right: There is no evidence of deep vein thrombosis in the lower extremity. No cystic structure found in the popliteal fossa. Left: There is no evidence of deep vein thrombosis in the lower extremity. No cystic structure found in the popliteal fossa.  *See table(s) above for measurements and observations. Electronically signed by Ruta Hinds MD on 06/07/2018 at 10:21:32 AM.    Final      Medications: Infusions:   Scheduled Medications: . [START ON 06/10/2018] amoxicillin-clavulanate  1 tablet Oral Q24H  . calcium acetate  667 mg Oral TID WC  . carvedilol  25 mg Oral BID WC  . hydrALAZINE  100 mg Oral Q8H    have reviewed scheduled and prn medications.  Physical Exam: General: Lying in bed, not in distress Heart:RRR, s1s2 nl, no rubs Lungs: Clear bilateral, no wheezing Abdomen:soft, Non-tender, non-distended Extremities:No edema Dialysis Access: AV fistula has a good thrill and bruit.  Dron Prasad Bhandari 06/09/2018,10:11 AM  LOS: 3 days

## 2018-06-09 NOTE — Telephone Encounter (Signed)
Hospital TOC per Dr Jari Favre discharge 06/09/18, appt 06/13/18.

## 2018-06-10 DIAGNOSIS — D631 Anemia in chronic kidney disease: Secondary | ICD-10-CM | POA: Diagnosis not present

## 2018-06-10 DIAGNOSIS — D509 Iron deficiency anemia, unspecified: Secondary | ICD-10-CM | POA: Diagnosis not present

## 2018-06-10 DIAGNOSIS — N186 End stage renal disease: Secondary | ICD-10-CM | POA: Diagnosis not present

## 2018-06-10 DIAGNOSIS — N2581 Secondary hyperparathyroidism of renal origin: Secondary | ICD-10-CM | POA: Diagnosis not present

## 2018-06-11 LAB — CULTURE, BLOOD (ROUTINE X 2)
Culture: NO GROWTH
Culture: NO GROWTH
SPECIAL REQUESTS: ADEQUATE

## 2018-06-13 ENCOUNTER — Ambulatory Visit: Payer: Medicare Other

## 2018-06-13 ENCOUNTER — Encounter: Payer: Self-pay | Admitting: Internal Medicine

## 2018-06-13 DIAGNOSIS — D631 Anemia in chronic kidney disease: Secondary | ICD-10-CM | POA: Diagnosis not present

## 2018-06-13 DIAGNOSIS — N186 End stage renal disease: Secondary | ICD-10-CM | POA: Diagnosis not present

## 2018-06-13 DIAGNOSIS — D509 Iron deficiency anemia, unspecified: Secondary | ICD-10-CM | POA: Diagnosis not present

## 2018-06-13 DIAGNOSIS — N2581 Secondary hyperparathyroidism of renal origin: Secondary | ICD-10-CM | POA: Diagnosis not present

## 2018-06-15 DIAGNOSIS — N186 End stage renal disease: Secondary | ICD-10-CM | POA: Diagnosis not present

## 2018-06-15 DIAGNOSIS — D509 Iron deficiency anemia, unspecified: Secondary | ICD-10-CM | POA: Diagnosis not present

## 2018-06-15 DIAGNOSIS — D631 Anemia in chronic kidney disease: Secondary | ICD-10-CM | POA: Diagnosis not present

## 2018-06-15 DIAGNOSIS — N2581 Secondary hyperparathyroidism of renal origin: Secondary | ICD-10-CM | POA: Diagnosis not present

## 2018-06-17 DIAGNOSIS — D631 Anemia in chronic kidney disease: Secondary | ICD-10-CM | POA: Diagnosis not present

## 2018-06-17 DIAGNOSIS — N186 End stage renal disease: Secondary | ICD-10-CM | POA: Diagnosis not present

## 2018-06-17 DIAGNOSIS — D509 Iron deficiency anemia, unspecified: Secondary | ICD-10-CM | POA: Diagnosis not present

## 2018-06-17 DIAGNOSIS — N2581 Secondary hyperparathyroidism of renal origin: Secondary | ICD-10-CM | POA: Diagnosis not present

## 2018-06-20 DIAGNOSIS — N186 End stage renal disease: Secondary | ICD-10-CM | POA: Diagnosis not present

## 2018-06-20 DIAGNOSIS — D631 Anemia in chronic kidney disease: Secondary | ICD-10-CM | POA: Diagnosis not present

## 2018-06-20 DIAGNOSIS — N2581 Secondary hyperparathyroidism of renal origin: Secondary | ICD-10-CM | POA: Diagnosis not present

## 2018-06-20 DIAGNOSIS — D509 Iron deficiency anemia, unspecified: Secondary | ICD-10-CM | POA: Diagnosis not present

## 2018-06-22 DIAGNOSIS — N186 End stage renal disease: Secondary | ICD-10-CM | POA: Diagnosis not present

## 2018-06-22 DIAGNOSIS — D631 Anemia in chronic kidney disease: Secondary | ICD-10-CM | POA: Diagnosis not present

## 2018-06-22 DIAGNOSIS — D509 Iron deficiency anemia, unspecified: Secondary | ICD-10-CM | POA: Diagnosis not present

## 2018-06-22 DIAGNOSIS — N2581 Secondary hyperparathyroidism of renal origin: Secondary | ICD-10-CM | POA: Diagnosis not present

## 2018-06-24 DIAGNOSIS — N186 End stage renal disease: Secondary | ICD-10-CM | POA: Diagnosis not present

## 2018-06-24 DIAGNOSIS — D631 Anemia in chronic kidney disease: Secondary | ICD-10-CM | POA: Diagnosis not present

## 2018-06-24 DIAGNOSIS — N2581 Secondary hyperparathyroidism of renal origin: Secondary | ICD-10-CM | POA: Diagnosis not present

## 2018-06-24 DIAGNOSIS — D509 Iron deficiency anemia, unspecified: Secondary | ICD-10-CM | POA: Diagnosis not present

## 2018-06-27 DIAGNOSIS — N2581 Secondary hyperparathyroidism of renal origin: Secondary | ICD-10-CM | POA: Diagnosis not present

## 2018-06-27 DIAGNOSIS — D509 Iron deficiency anemia, unspecified: Secondary | ICD-10-CM | POA: Diagnosis not present

## 2018-06-27 DIAGNOSIS — N186 End stage renal disease: Secondary | ICD-10-CM | POA: Diagnosis not present

## 2018-06-27 DIAGNOSIS — D631 Anemia in chronic kidney disease: Secondary | ICD-10-CM | POA: Diagnosis not present

## 2018-06-29 DIAGNOSIS — D509 Iron deficiency anemia, unspecified: Secondary | ICD-10-CM | POA: Diagnosis not present

## 2018-06-29 DIAGNOSIS — D631 Anemia in chronic kidney disease: Secondary | ICD-10-CM | POA: Diagnosis not present

## 2018-06-29 DIAGNOSIS — N186 End stage renal disease: Secondary | ICD-10-CM | POA: Diagnosis not present

## 2018-06-29 DIAGNOSIS — N2581 Secondary hyperparathyroidism of renal origin: Secondary | ICD-10-CM | POA: Diagnosis not present

## 2018-07-01 DIAGNOSIS — D631 Anemia in chronic kidney disease: Secondary | ICD-10-CM | POA: Diagnosis not present

## 2018-07-01 DIAGNOSIS — N2581 Secondary hyperparathyroidism of renal origin: Secondary | ICD-10-CM | POA: Diagnosis not present

## 2018-07-01 DIAGNOSIS — D509 Iron deficiency anemia, unspecified: Secondary | ICD-10-CM | POA: Diagnosis not present

## 2018-07-01 DIAGNOSIS — N186 End stage renal disease: Secondary | ICD-10-CM | POA: Diagnosis not present

## 2018-07-03 DIAGNOSIS — I129 Hypertensive chronic kidney disease with stage 1 through stage 4 chronic kidney disease, or unspecified chronic kidney disease: Secondary | ICD-10-CM | POA: Diagnosis not present

## 2018-07-03 DIAGNOSIS — Z992 Dependence on renal dialysis: Secondary | ICD-10-CM | POA: Diagnosis not present

## 2018-07-03 DIAGNOSIS — N186 End stage renal disease: Secondary | ICD-10-CM | POA: Diagnosis not present

## 2018-07-04 DIAGNOSIS — N2581 Secondary hyperparathyroidism of renal origin: Secondary | ICD-10-CM | POA: Diagnosis not present

## 2018-07-04 DIAGNOSIS — D631 Anemia in chronic kidney disease: Secondary | ICD-10-CM | POA: Diagnosis not present

## 2018-07-04 DIAGNOSIS — D509 Iron deficiency anemia, unspecified: Secondary | ICD-10-CM | POA: Diagnosis not present

## 2018-07-04 DIAGNOSIS — N186 End stage renal disease: Secondary | ICD-10-CM | POA: Diagnosis not present

## 2018-07-06 DIAGNOSIS — D509 Iron deficiency anemia, unspecified: Secondary | ICD-10-CM | POA: Diagnosis not present

## 2018-07-06 DIAGNOSIS — N2581 Secondary hyperparathyroidism of renal origin: Secondary | ICD-10-CM | POA: Diagnosis not present

## 2018-07-06 DIAGNOSIS — D631 Anemia in chronic kidney disease: Secondary | ICD-10-CM | POA: Diagnosis not present

## 2018-07-06 DIAGNOSIS — N186 End stage renal disease: Secondary | ICD-10-CM | POA: Diagnosis not present

## 2018-07-07 NOTE — Telephone Encounter (Signed)
Called pt - no answer; left message to call the office for f/u appt.

## 2018-07-08 DIAGNOSIS — N2581 Secondary hyperparathyroidism of renal origin: Secondary | ICD-10-CM | POA: Diagnosis not present

## 2018-07-08 DIAGNOSIS — D509 Iron deficiency anemia, unspecified: Secondary | ICD-10-CM | POA: Diagnosis not present

## 2018-07-08 DIAGNOSIS — N186 End stage renal disease: Secondary | ICD-10-CM | POA: Diagnosis not present

## 2018-07-08 DIAGNOSIS — D631 Anemia in chronic kidney disease: Secondary | ICD-10-CM | POA: Diagnosis not present

## 2018-07-11 DIAGNOSIS — N2581 Secondary hyperparathyroidism of renal origin: Secondary | ICD-10-CM | POA: Diagnosis not present

## 2018-07-11 DIAGNOSIS — D509 Iron deficiency anemia, unspecified: Secondary | ICD-10-CM | POA: Diagnosis not present

## 2018-07-11 DIAGNOSIS — D631 Anemia in chronic kidney disease: Secondary | ICD-10-CM | POA: Diagnosis not present

## 2018-07-11 DIAGNOSIS — N186 End stage renal disease: Secondary | ICD-10-CM | POA: Diagnosis not present

## 2018-07-13 DIAGNOSIS — N186 End stage renal disease: Secondary | ICD-10-CM | POA: Diagnosis not present

## 2018-07-13 DIAGNOSIS — N2581 Secondary hyperparathyroidism of renal origin: Secondary | ICD-10-CM | POA: Diagnosis not present

## 2018-07-13 DIAGNOSIS — D509 Iron deficiency anemia, unspecified: Secondary | ICD-10-CM | POA: Diagnosis not present

## 2018-07-13 DIAGNOSIS — D631 Anemia in chronic kidney disease: Secondary | ICD-10-CM | POA: Diagnosis not present

## 2018-07-15 DIAGNOSIS — D631 Anemia in chronic kidney disease: Secondary | ICD-10-CM | POA: Diagnosis not present

## 2018-07-15 DIAGNOSIS — N2581 Secondary hyperparathyroidism of renal origin: Secondary | ICD-10-CM | POA: Diagnosis not present

## 2018-07-15 DIAGNOSIS — D509 Iron deficiency anemia, unspecified: Secondary | ICD-10-CM | POA: Diagnosis not present

## 2018-07-15 DIAGNOSIS — N186 End stage renal disease: Secondary | ICD-10-CM | POA: Diagnosis not present

## 2018-07-18 DIAGNOSIS — N2581 Secondary hyperparathyroidism of renal origin: Secondary | ICD-10-CM | POA: Diagnosis not present

## 2018-07-18 DIAGNOSIS — D509 Iron deficiency anemia, unspecified: Secondary | ICD-10-CM | POA: Diagnosis not present

## 2018-07-18 DIAGNOSIS — N186 End stage renal disease: Secondary | ICD-10-CM | POA: Diagnosis not present

## 2018-07-18 DIAGNOSIS — D631 Anemia in chronic kidney disease: Secondary | ICD-10-CM | POA: Diagnosis not present

## 2018-07-20 DIAGNOSIS — N2581 Secondary hyperparathyroidism of renal origin: Secondary | ICD-10-CM | POA: Diagnosis not present

## 2018-07-20 DIAGNOSIS — N186 End stage renal disease: Secondary | ICD-10-CM | POA: Diagnosis not present

## 2018-07-20 DIAGNOSIS — D509 Iron deficiency anemia, unspecified: Secondary | ICD-10-CM | POA: Diagnosis not present

## 2018-07-20 DIAGNOSIS — D631 Anemia in chronic kidney disease: Secondary | ICD-10-CM | POA: Diagnosis not present

## 2018-07-22 DIAGNOSIS — N2581 Secondary hyperparathyroidism of renal origin: Secondary | ICD-10-CM | POA: Diagnosis not present

## 2018-07-22 DIAGNOSIS — N186 End stage renal disease: Secondary | ICD-10-CM | POA: Diagnosis not present

## 2018-07-22 DIAGNOSIS — D631 Anemia in chronic kidney disease: Secondary | ICD-10-CM | POA: Diagnosis not present

## 2018-07-22 DIAGNOSIS — D509 Iron deficiency anemia, unspecified: Secondary | ICD-10-CM | POA: Diagnosis not present

## 2018-07-25 DIAGNOSIS — N186 End stage renal disease: Secondary | ICD-10-CM | POA: Diagnosis not present

## 2018-07-25 DIAGNOSIS — D509 Iron deficiency anemia, unspecified: Secondary | ICD-10-CM | POA: Diagnosis not present

## 2018-07-25 DIAGNOSIS — N2581 Secondary hyperparathyroidism of renal origin: Secondary | ICD-10-CM | POA: Diagnosis not present

## 2018-07-25 DIAGNOSIS — D631 Anemia in chronic kidney disease: Secondary | ICD-10-CM | POA: Diagnosis not present

## 2018-07-27 DIAGNOSIS — D631 Anemia in chronic kidney disease: Secondary | ICD-10-CM | POA: Diagnosis not present

## 2018-07-27 DIAGNOSIS — N186 End stage renal disease: Secondary | ICD-10-CM | POA: Diagnosis not present

## 2018-07-27 DIAGNOSIS — N2581 Secondary hyperparathyroidism of renal origin: Secondary | ICD-10-CM | POA: Diagnosis not present

## 2018-07-27 DIAGNOSIS — D509 Iron deficiency anemia, unspecified: Secondary | ICD-10-CM | POA: Diagnosis not present

## 2018-07-29 DIAGNOSIS — N186 End stage renal disease: Secondary | ICD-10-CM | POA: Diagnosis not present

## 2018-07-29 DIAGNOSIS — D509 Iron deficiency anemia, unspecified: Secondary | ICD-10-CM | POA: Diagnosis not present

## 2018-07-29 DIAGNOSIS — N2581 Secondary hyperparathyroidism of renal origin: Secondary | ICD-10-CM | POA: Diagnosis not present

## 2018-07-29 DIAGNOSIS — D631 Anemia in chronic kidney disease: Secondary | ICD-10-CM | POA: Diagnosis not present

## 2018-08-01 DIAGNOSIS — N2581 Secondary hyperparathyroidism of renal origin: Secondary | ICD-10-CM | POA: Diagnosis not present

## 2018-08-01 DIAGNOSIS — D631 Anemia in chronic kidney disease: Secondary | ICD-10-CM | POA: Diagnosis not present

## 2018-08-01 DIAGNOSIS — N186 End stage renal disease: Secondary | ICD-10-CM | POA: Diagnosis not present

## 2018-08-01 DIAGNOSIS — D509 Iron deficiency anemia, unspecified: Secondary | ICD-10-CM | POA: Diagnosis not present

## 2018-08-03 ENCOUNTER — Inpatient Hospital Stay (HOSPITAL_COMMUNITY): Payer: Medicare Other

## 2018-08-03 ENCOUNTER — Emergency Department (HOSPITAL_COMMUNITY): Payer: Medicare Other

## 2018-08-03 ENCOUNTER — Other Ambulatory Visit: Payer: Self-pay | Admitting: Pulmonary Disease

## 2018-08-03 ENCOUNTER — Inpatient Hospital Stay (HOSPITAL_COMMUNITY)
Admission: EM | Admit: 2018-08-03 | Discharge: 2018-09-02 | DRG: 871 | Disposition: E | Payer: Medicare Other | Source: Other Acute Inpatient Hospital | Attending: Pulmonary Disease | Admitting: Pulmonary Disease

## 2018-08-03 ENCOUNTER — Encounter (HOSPITAL_COMMUNITY): Payer: Self-pay | Admitting: Emergency Medicine

## 2018-08-03 DIAGNOSIS — R0689 Other abnormalities of breathing: Secondary | ICD-10-CM | POA: Diagnosis not present

## 2018-08-03 DIAGNOSIS — E1122 Type 2 diabetes mellitus with diabetic chronic kidney disease: Secondary | ICD-10-CM | POA: Diagnosis present

## 2018-08-03 DIAGNOSIS — I361 Nonrheumatic tricuspid (valve) insufficiency: Secondary | ICD-10-CM

## 2018-08-03 DIAGNOSIS — Z841 Family history of disorders of kidney and ureter: Secondary | ICD-10-CM

## 2018-08-03 DIAGNOSIS — G9341 Metabolic encephalopathy: Secondary | ICD-10-CM | POA: Diagnosis present

## 2018-08-03 DIAGNOSIS — R579 Shock, unspecified: Secondary | ICD-10-CM | POA: Diagnosis present

## 2018-08-03 DIAGNOSIS — R451 Restlessness and agitation: Secondary | ICD-10-CM | POA: Diagnosis not present

## 2018-08-03 DIAGNOSIS — Z9115 Patient's noncompliance with renal dialysis: Secondary | ICD-10-CM | POA: Diagnosis not present

## 2018-08-03 DIAGNOSIS — I272 Pulmonary hypertension, unspecified: Secondary | ICD-10-CM | POA: Diagnosis present

## 2018-08-03 DIAGNOSIS — E11649 Type 2 diabetes mellitus with hypoglycemia without coma: Secondary | ICD-10-CM | POA: Diagnosis present

## 2018-08-03 DIAGNOSIS — I469 Cardiac arrest, cause unspecified: Secondary | ICD-10-CM | POA: Diagnosis present

## 2018-08-03 DIAGNOSIS — I420 Dilated cardiomyopathy: Secondary | ICD-10-CM | POA: Diagnosis not present

## 2018-08-03 DIAGNOSIS — I4510 Unspecified right bundle-branch block: Secondary | ICD-10-CM | POA: Diagnosis present

## 2018-08-03 DIAGNOSIS — J9600 Acute respiratory failure, unspecified whether with hypoxia or hypercapnia: Secondary | ICD-10-CM | POA: Diagnosis not present

## 2018-08-03 DIAGNOSIS — E872 Acidosis, unspecified: Secondary | ICD-10-CM

## 2018-08-03 DIAGNOSIS — A4189 Other specified sepsis: Secondary | ICD-10-CM | POA: Diagnosis present

## 2018-08-03 DIAGNOSIS — D631 Anemia in chronic kidney disease: Secondary | ICD-10-CM | POA: Diagnosis present

## 2018-08-03 DIAGNOSIS — Z789 Other specified health status: Secondary | ICD-10-CM

## 2018-08-03 DIAGNOSIS — Z9114 Patient's other noncompliance with medication regimen: Secondary | ICD-10-CM

## 2018-08-03 DIAGNOSIS — R069 Unspecified abnormalities of breathing: Secondary | ICD-10-CM | POA: Diagnosis not present

## 2018-08-03 DIAGNOSIS — I161 Hypertensive emergency: Secondary | ICD-10-CM | POA: Diagnosis present

## 2018-08-03 DIAGNOSIS — I132 Hypertensive heart and chronic kidney disease with heart failure and with stage 5 chronic kidney disease, or end stage renal disease: Secondary | ICD-10-CM | POA: Diagnosis present

## 2018-08-03 DIAGNOSIS — Z992 Dependence on renal dialysis: Secondary | ICD-10-CM

## 2018-08-03 DIAGNOSIS — E1165 Type 2 diabetes mellitus with hyperglycemia: Secondary | ICD-10-CM | POA: Diagnosis not present

## 2018-08-03 DIAGNOSIS — A419 Sepsis, unspecified organism: Secondary | ICD-10-CM

## 2018-08-03 DIAGNOSIS — Z87891 Personal history of nicotine dependence: Secondary | ICD-10-CM

## 2018-08-03 DIAGNOSIS — N186 End stage renal disease: Secondary | ICD-10-CM | POA: Diagnosis present

## 2018-08-03 DIAGNOSIS — I428 Other cardiomyopathies: Secondary | ICD-10-CM | POA: Diagnosis present

## 2018-08-03 DIAGNOSIS — R570 Cardiogenic shock: Secondary | ICD-10-CM | POA: Diagnosis not present

## 2018-08-03 DIAGNOSIS — Z4659 Encounter for fitting and adjustment of other gastrointestinal appliance and device: Secondary | ICD-10-CM

## 2018-08-03 DIAGNOSIS — Z452 Encounter for adjustment and management of vascular access device: Secondary | ICD-10-CM | POA: Diagnosis not present

## 2018-08-03 DIAGNOSIS — I5042 Chronic combined systolic (congestive) and diastolic (congestive) heart failure: Secondary | ICD-10-CM | POA: Diagnosis present

## 2018-08-03 DIAGNOSIS — Z66 Do not resuscitate: Secondary | ICD-10-CM | POA: Diagnosis not present

## 2018-08-03 DIAGNOSIS — I499 Cardiac arrhythmia, unspecified: Secondary | ICD-10-CM | POA: Diagnosis not present

## 2018-08-03 DIAGNOSIS — R079 Chest pain, unspecified: Secondary | ICD-10-CM | POA: Diagnosis not present

## 2018-08-03 DIAGNOSIS — I959 Hypotension, unspecified: Secondary | ICD-10-CM

## 2018-08-03 DIAGNOSIS — Z79899 Other long term (current) drug therapy: Secondary | ICD-10-CM

## 2018-08-03 DIAGNOSIS — R0602 Shortness of breath: Secondary | ICD-10-CM | POA: Diagnosis not present

## 2018-08-03 DIAGNOSIS — I2510 Atherosclerotic heart disease of native coronary artery without angina pectoris: Secondary | ICD-10-CM | POA: Diagnosis present

## 2018-08-03 DIAGNOSIS — I120 Hypertensive chronic kidney disease with stage 5 chronic kidney disease or end stage renal disease: Secondary | ICD-10-CM | POA: Diagnosis not present

## 2018-08-03 DIAGNOSIS — R6521 Severe sepsis with septic shock: Secondary | ICD-10-CM | POA: Diagnosis not present

## 2018-08-03 DIAGNOSIS — J9601 Acute respiratory failure with hypoxia: Secondary | ICD-10-CM | POA: Diagnosis present

## 2018-08-03 DIAGNOSIS — G9340 Encephalopathy, unspecified: Secondary | ICD-10-CM | POA: Diagnosis not present

## 2018-08-03 LAB — RESPIRATORY PANEL BY PCR

## 2018-08-03 LAB — CBC WITH DIFFERENTIAL/PLATELET
Abs Immature Granulocytes: 0.42 10*3/uL — ABNORMAL HIGH (ref 0.00–0.07)
Basophils Absolute: 0 10*3/uL (ref 0.0–0.1)
Basophils Relative: 0 %
Eosinophils Absolute: 0 10*3/uL (ref 0.0–0.5)
Eosinophils Relative: 0 %
HCT: 33.8 % — ABNORMAL LOW (ref 39.0–52.0)
Hemoglobin: 8.8 g/dL — ABNORMAL LOW (ref 13.0–17.0)
Immature Granulocytes: 2 %
Lymphocytes Relative: 10 %
Lymphs Abs: 1.8 10*3/uL (ref 0.7–4.0)
MCH: 21.7 pg — ABNORMAL LOW (ref 26.0–34.0)
MCHC: 26 g/dL — ABNORMAL LOW (ref 30.0–36.0)
MCV: 83.3 fL (ref 80.0–100.0)
Monocytes Absolute: 1.4 10*3/uL — ABNORMAL HIGH (ref 0.1–1.0)
Monocytes Relative: 8 %
Neutro Abs: 14.9 10*3/uL — ABNORMAL HIGH (ref 1.7–7.7)
Neutrophils Relative %: 80 %
Platelets: 371 10*3/uL (ref 150–400)
RBC: 4.06 MIL/uL — ABNORMAL LOW (ref 4.22–5.81)
RDW: 23 % — ABNORMAL HIGH (ref 11.5–15.5)
WBC: 18.6 10*3/uL — ABNORMAL HIGH (ref 4.0–10.5)
nRBC: 1.8 % — ABNORMAL HIGH (ref 0.0–0.2)

## 2018-08-03 LAB — LIPASE, BLOOD: Lipase: 34 U/L (ref 11–51)

## 2018-08-03 LAB — POCT I-STAT 7, (LYTES, BLD GAS, ICA,H+H)
Acid-base deficit: 17 mmol/L — ABNORMAL HIGH (ref 0.0–2.0)
Bicarbonate: 10.1 mmol/L — ABNORMAL LOW (ref 20.0–28.0)
Calcium, Ion: 0.85 mmol/L — CL (ref 1.15–1.40)
HCT: 27 % — ABNORMAL LOW (ref 39.0–52.0)
Hemoglobin: 9.2 g/dL — ABNORMAL LOW (ref 13.0–17.0)
O2 Saturation: 100 %
Patient temperature: 96.1
Potassium: 5.5 mmol/L — ABNORMAL HIGH (ref 3.5–5.1)
Sodium: 137 mmol/L (ref 135–145)
TCO2: 11 mmol/L — ABNORMAL LOW (ref 22–32)
pCO2 arterial: 24.9 mmHg — ABNORMAL LOW (ref 32.0–48.0)
pH, Arterial: 7.209 — ABNORMAL LOW (ref 7.350–7.450)
pO2, Arterial: 421 mmHg — ABNORMAL HIGH (ref 83.0–108.0)

## 2018-08-03 LAB — BASIC METABOLIC PANEL
Anion gap: 30 — ABNORMAL HIGH (ref 5–15)
Anion gap: 27 — ABNORMAL HIGH (ref 5–15)
BUN: 24 mg/dL — ABNORMAL HIGH (ref 6–20)
BUN: 31 mg/dL — ABNORMAL HIGH (ref 6–20)
CO2: 7 mmol/L — ABNORMAL LOW (ref 22–32)
CO2: 17 mmol/L — ABNORMAL LOW (ref 22–32)
Calcium: 6.2 mg/dL — CL (ref 8.9–10.3)
Calcium: 7.8 mg/dL — ABNORMAL LOW (ref 8.9–10.3)
Chloride: 106 mmol/L (ref 98–111)
Chloride: 97 mmol/L — ABNORMAL LOW (ref 98–111)
Creatinine, Ser: 9.52 mg/dL — ABNORMAL HIGH (ref 0.61–1.24)
Creatinine, Ser: 11.62 mg/dL — ABNORMAL HIGH (ref 0.61–1.24)
GFR calc Af Amer: 7 mL/min — ABNORMAL LOW (ref >60)
GFR calc Af Amer: 5 mL/min — ABNORMAL LOW (ref >60)
GFR calc non Af Amer: 6 mL/min — ABNORMAL LOW (ref >60)
GFR calc non Af Amer: 5 mL/min — ABNORMAL LOW (ref >60)
Glucose, Bld: 150 mg/dL — ABNORMAL HIGH (ref 70–99)
Glucose, Bld: 171 mg/dL — ABNORMAL HIGH (ref 70–99)
Potassium: 4.7 mmol/L (ref 3.5–5.1)
Potassium: 3.9 mmol/L (ref 3.5–5.1)
Sodium: 143 mmol/L (ref 135–145)
Sodium: 141 mmol/L (ref 135–145)

## 2018-08-03 LAB — ECHOCARDIOGRAM LIMITED: Height: 64 in

## 2018-08-03 LAB — POCT I-STAT EG7
Acid-base deficit: 17 mmol/L — ABNORMAL HIGH (ref 0.0–2.0)
Bicarbonate: 9.9 mmol/L — ABNORMAL LOW (ref 20.0–28.0)
Calcium, Ion: 0.91 mmol/L — ABNORMAL LOW (ref 1.15–1.40)
HCT: 30 % — ABNORMAL LOW (ref 39.0–52.0)
Hemoglobin: 10.2 g/dL — ABNORMAL LOW (ref 13.0–17.0)
O2 Saturation: 89 %
Potassium: 3.7 mmol/L (ref 3.5–5.1)
Sodium: 142 mmol/L (ref 135–145)
TCO2: 11 mmol/L — ABNORMAL LOW (ref 22–32)
pCO2, Ven: 28.1 mmHg — ABNORMAL LOW (ref 44.0–60.0)
pH, Ven: 7.156 — CL (ref 7.250–7.430)
pO2, Ven: 71 mmHg — ABNORMAL HIGH (ref 32.0–45.0)

## 2018-08-03 LAB — FERRITIN: Ferritin: 6485 ng/mL — ABNORMAL HIGH (ref 24–336)

## 2018-08-03 LAB — COMPREHENSIVE METABOLIC PANEL
ALT: 34 U/L (ref 0–44)
AST: 68 U/L — ABNORMAL HIGH (ref 15–41)
Albumin: 4.1 g/dL (ref 3.5–5.0)
Alkaline Phosphatase: 91 U/L (ref 38–126)
Anion gap: 38 — ABNORMAL HIGH (ref 5–15)
BUN: 30 mg/dL — ABNORMAL HIGH (ref 6–20)
CO2: 12 mmol/L — ABNORMAL LOW (ref 22–32)
Calcium: 9.3 mg/dL (ref 8.9–10.3)
Chloride: 95 mmol/L — ABNORMAL LOW (ref 98–111)
Creatinine, Ser: 11.83 mg/dL — ABNORMAL HIGH (ref 0.61–1.24)
GFR calc Af Amer: 5 mL/min — ABNORMAL LOW (ref >60)
GFR calc non Af Amer: 5 mL/min — ABNORMAL LOW (ref >60)
Glucose, Bld: 34 mg/dL — CL (ref 70–99)
Potassium: 3.6 mmol/L (ref 3.5–5.1)
Sodium: 145 mmol/L (ref 135–145)
Total Bilirubin: 2.1 mg/dL — ABNORMAL HIGH (ref 0.3–1.2)
Total Protein: 8.6 g/dL — ABNORMAL HIGH (ref 6.5–8.1)

## 2018-08-03 LAB — CBG MONITORING, ED
Glucose-Capillary: 29 mg/dL — CL (ref 70–99)
Glucose-Capillary: 73 mg/dL (ref 70–99)
Glucose-Capillary: 122 mg/dL — ABNORMAL HIGH (ref 70–99)
Glucose-Capillary: 66 mg/dL — ABNORMAL LOW (ref 70–99)
Glucose-Capillary: 86 mg/dL (ref 70–99)

## 2018-08-03 LAB — LACTIC ACID, PLASMA
Lactic Acid, Venous: >11 mmol/L (ref 0.5–1.9)
Lactic Acid, Venous: >11 mmol/L (ref 0.5–1.9)
Lactic Acid, Venous: >11 mmol/L (ref 0.5–1.9)
Lactic Acid, Venous: >11 mmol/L (ref 0.5–1.9)
Lactic Acid, Venous: >11 mmol/L (ref 0.5–1.9)

## 2018-08-03 LAB — TROPONIN I: Troponin I: 0.08 ng/mL (ref <0.03)

## 2018-08-03 LAB — TSH: TSH: 1.507 u[IU]/mL (ref 0.350–4.500)

## 2018-08-03 LAB — COOXEMETRY PANEL
Carboxyhemoglobin: 2.4 % — ABNORMAL HIGH (ref 0.5–1.5)
Methemoglobin: 2.2 % — ABNORMAL HIGH (ref 0.0–1.5)
O2 Saturation: 87.7 %
Total hemoglobin: 6.9 g/dL — CL (ref 12.0–16.0)

## 2018-08-03 LAB — LACTATE DEHYDROGENASE: LDH: 1599 U/L — ABNORMAL HIGH (ref 98–192)

## 2018-08-03 LAB — I-STAT TROPONIN, ED: Troponin i, poc: 0.16 ng/mL (ref 0.00–0.08)

## 2018-08-03 LAB — PROCALCITONIN: Procalcitonin: 3.6 ng/mL

## 2018-08-03 LAB — C-REACTIVE PROTEIN: CRP: 6.1 mg/dL — ABNORMAL HIGH (ref <1.0)

## 2018-08-03 LAB — AMYLASE: Amylase: 94 U/L (ref 28–100)

## 2018-08-03 LAB — GLUCOSE, CAPILLARY: Glucose-Capillary: 119 mg/dL — ABNORMAL HIGH (ref 70–99)

## 2018-08-03 LAB — MRSA PCR SCREENING: MRSA by PCR: NEGATIVE

## 2018-08-03 MED ORDER — IOHEXOL 350 MG/ML SOLN
100.0000 mL | Freq: Once | INTRAVENOUS | Status: AC | PRN
  Administered 2018-08-03: 180 mL via INTRAVENOUS

## 2018-08-03 MED ORDER — SUCCINYLCHOLINE CHLORIDE 20 MG/ML IJ SOLN
INTRAMUSCULAR | Status: AC | PRN
  Administered 2018-08-03: 100 mg via INTRAVENOUS

## 2018-08-03 MED ORDER — HEPARIN SODIUM (PORCINE) 5000 UNIT/ML IJ SOLN
5000.0000 [IU] | Freq: Three times a day (TID) | INTRAMUSCULAR | Status: DC
  Administered 2018-08-03 – 2018-08-04 (×3): 5000 [IU] via SUBCUTANEOUS
  Filled 2018-08-03 (×3): qty 1

## 2018-08-03 MED ORDER — VANCOMYCIN HCL 10 G IV SOLR
1500.0000 mg | Freq: Once | INTRAVENOUS | Status: AC
  Administered 2018-08-03: 14:00:00 1500 mg via INTRAVENOUS
  Filled 2018-08-03: qty 1500

## 2018-08-03 MED ORDER — PRISMASOL BGK 4/2.5 32-4-2.5 MEQ/L IV SOLN
INTRAVENOUS | Status: DC
  Administered 2018-08-04: 08:00:00 via INTRAVENOUS_CENTRAL
  Filled 2018-08-03 (×17): qty 5000

## 2018-08-03 MED ORDER — HYDROXYCHLOROQUINE SULFATE 200 MG PO TABS: 200.0000 mg | ORAL_TABLET | Freq: Two times a day (BID) | ORAL | Status: DC

## 2018-08-03 MED ORDER — LABETALOL HCL 5 MG/ML IV SOLN
20.0000 mg | Freq: Once | INTRAVENOUS | Status: AC
  Administered 2018-08-03: 11:00:00 20 mg via INTRAVENOUS
  Filled 2018-08-03: qty 4

## 2018-08-03 MED ORDER — MIDAZOLAM HCL 2 MG/2ML IJ SOLN
INTRAMUSCULAR | Status: AC
  Filled 2018-08-03: qty 2

## 2018-08-03 MED ORDER — SODIUM CHLORIDE 0.9 % IV BOLUS
500.0000 mL | Freq: Once | INTRAVENOUS | Status: AC
  Administered 2018-08-03: 11:00:00 500 mL via INTRAVENOUS

## 2018-08-03 MED ORDER — SODIUM BICARBONATE 8.4 % IV SOLN
INTRAVENOUS | Status: DC
  Administered 2018-08-03 (×2): via INTRAVENOUS
  Filled 2018-08-03 (×8): qty 150

## 2018-08-03 MED ORDER — ETOMIDATE 2 MG/ML IV SOLN
INTRAVENOUS | Status: AC | PRN
  Administered 2018-08-03: 30 mg via INTRAVENOUS

## 2018-08-03 MED ORDER — DEXTROSE 50 % IV SOLN
INTRAVENOUS | Status: AC
  Filled 2018-08-03: qty 50

## 2018-08-03 MED ORDER — NOREPINEPHRINE 4 MG/250ML-% IV SOLN
0.0000 ug/min | INTRAVENOUS | Status: DC
  Administered 2018-08-03 – 2018-08-04 (×2): 40 ug/min via INTRAVENOUS
  Filled 2018-08-03 (×2): qty 250

## 2018-08-03 MED ORDER — PIPERACILLIN-TAZOBACTAM 3.375 G IVPB 30 MIN
3.3750 g | Freq: Once | INTRAVENOUS | Status: DC
  Filled 2018-08-03: qty 50

## 2018-08-03 MED ORDER — PIPERACILLIN-TAZOBACTAM IN DEX 2-0.25 GM/50ML IV SOLN
2.2500 g | Freq: Four times a day (QID) | INTRAVENOUS | Status: DC
  Administered 2018-08-03 – 2018-08-04 (×3): 2.25 g via INTRAVENOUS
  Filled 2018-08-03 (×6): qty 50

## 2018-08-03 MED ORDER — FENTANYL CITRATE (PF) 100 MCG/2ML IJ SOLN
INTRAMUSCULAR | Status: AC
  Filled 2018-08-03: qty 2

## 2018-08-03 MED ORDER — MIDAZOLAM HCL 2 MG/2ML IJ SOLN: 2.0000 mg | INTRAMUSCULAR | Status: DC | PRN

## 2018-08-03 MED ORDER — MIDAZOLAM HCL 2 MG/2ML IJ SOLN
2.0000 mg | INTRAMUSCULAR | Status: DC | PRN
  Filled 2018-08-03: qty 2

## 2018-08-03 MED ORDER — FENTANYL CITRATE (PF) 100 MCG/2ML IJ SOLN
INTRAMUSCULAR | Status: AC | PRN
  Administered 2018-08-03: 100 ug via INTRAVENOUS

## 2018-08-03 MED ORDER — EPINEPHRINE PF 1 MG/10ML IJ SOSY
PREFILLED_SYRINGE | INTRAMUSCULAR | Status: AC | PRN
  Administered 2018-08-03: 1 mg via INTRAVENOUS

## 2018-08-03 MED ORDER — NITROGLYCERIN IN D5W 200-5 MCG/ML-% IV SOLN
0.0000 ug/min | INTRAVENOUS | Status: DC
  Administered 2018-08-03: 10:00:00 5 ug/min via INTRAVENOUS
  Filled 2018-08-03: qty 250

## 2018-08-03 MED ORDER — DEXTROSE 5 % AND 0.9 % NACL IV BOLUS: 500.0000 mL | Freq: Once | INTRAVENOUS | Status: AC

## 2018-08-03 MED ORDER — FENTANYL CITRATE (PF) 100 MCG/2ML IJ SOLN
100.0000 ug | INTRAMUSCULAR | Status: DC | PRN
  Filled 2018-08-03: qty 2

## 2018-08-03 MED ORDER — ZINC SULFATE 220 (50 ZN) MG PO CAPS
220.0000 mg | ORAL_CAPSULE | Freq: Every day | ORAL | Status: DC
  Administered 2018-08-03 – 2018-08-04 (×2): 220 mg
  Filled 2018-08-03 (×2): qty 1

## 2018-08-03 MED ORDER — EPINEPHRINE PF 1 MG/10ML IJ SOSY
PREFILLED_SYRINGE | INTRAMUSCULAR | Status: AC | PRN
  Administered 2018-08-03 (×4): 1 via INTRAVENOUS

## 2018-08-03 MED ORDER — FENTANYL 2500MCG IN NS 250ML (10MCG/ML) PREMIX INFUSION
25.0000 ug/h | INTRAVENOUS | Status: DC
  Administered 2018-08-03: 150 ug/h via INTRAVENOUS
  Filled 2018-08-03 (×3): qty 250

## 2018-08-03 MED ORDER — MIDAZOLAM HCL 5 MG/5ML IJ SOLN
INTRAMUSCULAR | Status: AC | PRN
  Administered 2018-08-03: 2 mg via INTRAVENOUS

## 2018-08-03 MED ORDER — PIPERACILLIN-TAZOBACTAM 3.375 G IVPB: 3.3750 g | Freq: Two times a day (BID) | INTRAVENOUS | Status: DC

## 2018-08-03 MED ORDER — SODIUM CHLORIDE 0.9 % IV SOLN
2.0000 g | Freq: Once | INTRAVENOUS | Status: DC
  Filled 2018-08-03: qty 2

## 2018-08-03 MED ORDER — FENTANYL CITRATE (PF) 100 MCG/2ML IJ SOLN: 100.0000 ug | INTRAMUSCULAR | Status: DC | PRN

## 2018-08-03 MED ORDER — PRISMASOL BGK 4/2.5 32-4-2.5 MEQ/L REPLACEMENT SOLN
Status: DC
  Filled 2018-08-03 (×4): qty 5000

## 2018-08-03 MED ORDER — FENTANYL BOLUS VIA INFUSION
50.0000 ug | INTRAVENOUS | Status: DC | PRN
  Filled 2018-08-03: qty 50

## 2018-08-03 MED ORDER — SODIUM CHLORIDE 0.9 % FOR CRRT
INTRAVENOUS_CENTRAL | Status: DC | PRN
  Filled 2018-08-03: qty 1000

## 2018-08-03 MED ORDER — SODIUM CHLORIDE 0.9 % IV SOLN
INTRAVENOUS | Status: AC | PRN
  Administered 2018-08-03: 1000 mL via INTRAVENOUS

## 2018-08-03 MED ORDER — METRONIDAZOLE IN NACL 5-0.79 MG/ML-% IV SOLN
500.0000 mg | Freq: Once | INTRAVENOUS | Status: DC
  Filled 2018-08-03: qty 100

## 2018-08-03 MED ORDER — ALTEPLASE 2 MG IJ SOLR
2.0000 mg | Freq: Once | INTRAMUSCULAR | Status: DC | PRN
  Filled 2018-08-03: qty 2

## 2018-08-03 MED ORDER — HYDROXYCHLOROQUINE SULFATE 200 MG PO TABS
400.0000 mg | ORAL_TABLET | Freq: Two times a day (BID) | ORAL | Status: AC
  Administered 2018-08-03 (×2): 400 mg via ORAL
  Filled 2018-08-03 (×2): qty 2

## 2018-08-03 MED ORDER — HEPARIN SODIUM (PORCINE) 1000 UNIT/ML DIALYSIS
1000.0000 [IU] | INTRAMUSCULAR | Status: DC | PRN
  Administered 2018-08-04: 2400 [IU] via INTRAVENOUS_CENTRAL
  Filled 2018-08-03: qty 1
  Filled 2018-08-03: qty 3
  Filled 2018-08-03 (×2): qty 6

## 2018-08-03 MED ORDER — MIDAZOLAM HCL 2 MG/2ML IJ SOLN
2.0000 mg | Freq: Once | INTRAMUSCULAR | Status: AC
  Administered 2018-08-03: 18:00:00 2 mg via INTRAVENOUS

## 2018-08-03 MED ORDER — HYDROXYCHLOROQUINE 25 MG/ML ORAL SUSPENSION
400.0000 mg | Freq: Two times a day (BID) | Status: DC
  Filled 2018-08-03: qty 16

## 2018-08-03 MED ORDER — HYDROXYCHLOROQUINE 25 MG/ML ORAL SUSPENSION
200.0000 mg | Freq: Two times a day (BID) | Status: DC
  Filled 2018-08-03: qty 8

## 2018-08-03 MED ORDER — CALCIUM GLUCONATE-NACL 1-0.675 GM/50ML-% IV SOLN
1.0000 g | Freq: Once | INTRAVENOUS | Status: AC
  Administered 2018-08-03: 18:00:00 1000 mg via INTRAVENOUS
  Filled 2018-08-03: qty 50

## 2018-08-03 MED ORDER — PANTOPRAZOLE SODIUM 40 MG IV SOLR
40.0000 mg | Freq: Every day | INTRAVENOUS | Status: DC
  Administered 2018-08-03: 40 mg via INTRAVENOUS
  Filled 2018-08-03 (×2): qty 40

## 2018-08-03 MED ORDER — HYDROXYCHLOROQUINE SULFATE 200 MG PO TABS
200.0000 mg | ORAL_TABLET | Freq: Two times a day (BID) | ORAL | Status: DC
  Filled 2018-08-03: qty 1

## 2018-08-03 MED ORDER — DEXTROSE 50 % IV SOLN
1.0000 | Freq: Once | INTRAVENOUS | Status: AC
  Administered 2018-08-03: 11:00:00 50 mL via INTRAVENOUS

## 2018-08-03 MED ORDER — EPINEPHRINE PF 1 MG/ML IJ SOLN
0.5000 ug/min | INTRAVENOUS | Status: DC
  Administered 2018-08-03: 14:00:00 2.5 ug/min via INTRAVENOUS
  Administered 2018-08-04: 10:00:00 20 ug/min via INTRAVENOUS
  Filled 2018-08-03 (×3): qty 4

## 2018-08-03 NOTE — Progress Notes (Signed)
Called [facility] and asked Elta Gu[place], RN to relay to Dr. Emmit Alexanders critical lactic >11. Elta Gu[place], RN verbalized he would give MD results.

## 2018-08-03 NOTE — ED Notes (Signed)
Pt's CBG result was 86. Informed [name], sports.

## 2018-08-03 NOTE — Consult Note (Addendum)
Renal Service Consult Note [facility]  Loretta Kluender 08/30/2018 Sol Blazing Requesting Physician:  Dr Ander Slade  Reason for Consult:  ESRD pt sp cardiac arrest  HPI: The patient is a 48 y.o. year-old with hx of HTN, NICM, ESRD on HD MWF presented to ED this afternoon with resp distress sent from HD (did not get HD).  SOB started yesterday, no cough/ congestion. Had full HD on Monday.  No hx PE.  In ED was tachy w/ RBBB (old) on EKG. BP was high at 230/117, pt was placed on bipap and IV NTG drip. CXR was negative for acute findings. Remained tachy on bipap, WBC was 18k. Lactate was > 11. Got IV meds for HTN. BS was 29, given D50.  Pt noted he was having n/v when trying to eat recnetly x a few days.  ABG showed pH 7.15, was getting fluid bolus when he coded, rec'd epi and was intubated.  CT angio chest/ abd for dissection did not show any dissection or other acute findings. Broad spec IV abx were started and pt was admitted to ICU.  ABG showed severe met acidosis.  Asked to see for ESRD.    Patient had temp at HD last week on Friday (102 deg reportedly), cx's were not done, pt was instructed to go to the hospital for evaluation / testing but he did not go.       echart:  sept 2017 pt from out of town [facility]) admitted for a/c syst CHF, NICM/ PSA hx, AKI on CKD5.  US showed atrophic kidneys, renal consulted.  Treated w/ IV lasix, BP meds. HD was recommeneded but pt refused and made himself DNR. Was dc'd home.   in dec 2017 pt admitted w/ AoCKD3, uremic and was started on HD, had AVF placed also HTN/ gout , dc'd home to OP HD tiw  in feb 2020 pt admitted for fevers, dry cough, SOB, temp 102 in ED, flulike illness w/ SOB and pulm edema, improved w/ HD, got IV abx and was dc'd after 3days in hospital  Past Medical History  Past Medical History:  Diagnosis Date  . CHF (congestive heart failure) ([place])   . Hypertension   . Meralgia paraesthetica, right 02/11/2017  . Renal disorder    dialysis MWF   Past Surgical History  Past Surgical History:  Procedure Laterality Date  . AV FISTULA PLACEMENT Left 04/07/2016   Procedure: Left  arm Brachiocephalic ARTERIOVENOUS (AV) FISTULA CREATION;  Surgeon: Conrad Symerton, MD;  Location: [facility];  Service: Vascular;  Laterality: Left;  . INSERTION OF DIALYSIS CATHETER Right 04/07/2016   Procedure: INSERTION OF DIALYSIS CATHETER;  Surgeon: Conrad [place], MD;  Location: [facility];  Service: Vascular;  Laterality: Right;  . NO PAST SURGERIES     Family History  Family History  Problem Relation Age of Onset  . Kidney failure Mother        was on dialysis   Social History  reports that he has quit smoking. His smoking use included cigarettes. He has a 0.50 pack-year smoking history. He has never used smokeless tobacco. He reports that he does not drink alcohol or use drugs. Allergies  Allergies  Allergen Reactions  . No Known Allergies    Home medications Prior to Admission medications   Medication Sig Start Date End Date Taking? Authorizing Provider  amoxicillin-clavulanate (AUGMENTIN) 500-125 MG tablet Take 1 tablet (500 mg total) by mouth daily. 06/09/18   Minor, Grace Bushy, NP  calcitRIOL (ROCALTROL) 0.5 MCG capsule Take 2 capsules (1 mcg total) by mouth every other day. Patient not taking: Reported on 06/06/2018 04/07/16   Holley Raring, MD  calcium acetate (PHOSLO) 667 MG capsule Take 3 capsules (2,001 mg total) by mouth 3 (three) times daily with meals. Patient not taking: Reported on 06/06/2018 04/07/16   Holley Raring, MD  carbamazepine (TEGRETOL) 200 MG tablet Take 1 tablet (200 mg total) by mouth 2 (two) times daily. 02/11/17   Kathrynn Ducking, MD  carvedilol (COREG) 25 MG tablet Take 1 tablet (25 mg total) by mouth 2 (two) times daily with a meal. 04/07/16   Holley Raring, MD  cinacalcet (SENSIPAR) 30 MG tablet Take 2 tablets (60 mg total) by mouth daily with supper. 04/07/16   Holley Raring, MD  cloNIDine (CATAPRES) 0.3 MG tablet  Take 0.3 mg by mouth 3 (three) times daily.     [provider]  gabapentin (NEURONTIN) 300 MG capsule Take 1 capsule (300 mg total) by mouth at bedtime. 07/28/16 06/06/18  Shela Leff, MD  hydrALAZINE (APRESOLINE) 100 MG tablet Take 1 tablet (100 mg total) by mouth 3 (three) times daily. 07/28/16   Shela Leff, MD  ibuprofen (ADVIL,MOTRIN) 200 MG tablet Take 200 mg by mouth every 6 (six) hours as needed for moderate pain.     [provider]   Liver Function Tests Recent Labs  Lab 08/28/2018 0923  AST 68*  ALT 34  ALKPHOS 91  BILITOT 2.1*  PROT 8.6*  ALBUMIN 4.1   No results for input(s): LIPASE, AMYLASE in the last 168 hours. CBC Recent Labs  Lab 08/16/2018 0923 08/22/2018 1135 08/29/2018 1526  WBC 18.6*  --   --   NEUTROABS 14.9*  --   --   HGB 8.8* 10.2* 9.2*  HCT 33.8* 30.0* 27.0*  MCV 83.3  --   --   PLT 371  --   --    Basic Metabolic Panel Recent Labs  Lab 08/26/2018 0923 08/10/2018 1135 08/20/2018 1526  NA 145 142 137  K 3.6 3.7 5.5*  CL 95*  --   --   CO2 12*  --   --   GLUCOSE 34*  --   --   BUN 30*  --   --   CREATININE 11.83*  --   --   CALCIUM 9.3  --   --    Iron/TIBC/Ferritin/ %Sat    Component Value Date/Time   IRON 20 (L) 06/08/2018 0504   TIBC 230 (L) 06/08/2018 0504   FERRITIN 1,378 (H) 06/08/2018 0504   IRONPCTSAT 9 (L) 06/08/2018 0504    Vitals:   08/12/2018 1345 08/05/2018 1400 08/23/2018 1451 08/16/2018 1538  BP: 133/74 (!) 136/95  135/82  Pulse:    84  Resp: (!) 34 (!) 36  (!) 40  Temp:   (!) 96.1 F (35.6 C)   TempSrc:   Axillary   SpO2:    100%  Height:       Exam: not directly examined due to COVID-19 rule-out test pending, utilizing primary care team's exam findings    Home meds:  - gabapentin 300 hs/ carbamazepine 200 bid  - calc acetate ac tid/ cinacalcet 50 hs  - carvedilol  25 bid/ clonidine 0.3 tid/ hydralazine 100 tid    [facility] MWF  4h  450/1.5   82kg   2/2.5 bath  LUA AVF   Hep none - mircera 200 every  2 wks - calcitriol 3 ug tiw -  getting to dry wt   Assessment: 1. Cardiac arrest - in ED this am 2. Severe metabolic acidosis - ^'d lactate, pH <7.2 on ABG 3. Resp failure - intubated, ground glass changes on CT chest, CXR no acute findings.  4. ESRD on HD - has not missed dialysis, not grossly vol overloaded, 1kg over edw. GG changes on CT could be infectious.  5. HTN - long hx , on 3 BP meds at home 6. H/o CHF - in past hx of systolic CHF but most recent EF in Feb '20 was wnl.     Plan: 1. Too unstable for regular hemodialysis, recommend CRRT for acidemia.       [facility] 08/19/2018, 4:24 PM

## 2018-08-03 NOTE — Progress Notes (Signed)
eLink Physician-Brief Progress Note Patient Name: Cameron [name] DOB: [birth year] MRN: [id number]   Date of Service  08/10/2018  HPI/Events of Note  Lactic Acid > 11.0 X 2. Hgb = 9.2.   eICU Interventions  Will order: 1. CVP now via HD access pigtail. 2. COOX now.  3. Continue to trend Lactic Acid level.     Intervention Category Major Interventions: Acid-Base disturbance - evaluation and management  Sommer,Steven Eugene 08/08/2018, 9:59 PM

## 2018-08-03 NOTE — ED Notes (Signed)
Got patient on the monitor did ekg shown to er doctor patient is resting with call bell in reach 

## 2018-08-03 NOTE — ED Notes (Signed)
Critical Lab results given to Dr.Pluckett.

## 2018-08-03 NOTE — ED Notes (Signed)
Pt restless in bed.   Pulls bipap off repeatedly but will allow replacement.  New c/o mid lower abd pain

## 2018-08-03 NOTE — Code Documentation (Signed)
EMT Beather Arbour, providing supplemental oxygen via BVM.

## 2018-08-03 NOTE — Progress Notes (Addendum)
Pharmacy Antibiotic Note  Cameron [name] is a 48 y.o. male admitted on 08/05/2018 with SOB and hypertension. Pharmacy has been consulted for zosyn dosing for sepsis. Pt is afebrile but WBC is elevated at 18.6. Pt with history of ESRD on HD.   Determined too unstable for HD. Plan for CRRT for acidemia- will adjust zosyn dosing.   Plan: Adjust zosyn dosing to 2.25 g IV every 6 hours while on CRRT F/u renal plans, C&S, clinical status - monitor renal plans  Height: 5\' 4"  (162.6 cm) IBW/kg (Calculated) : 59.2  Temp (24hrs), Avg:96.9 F (36.1 C), Min:96.1 F (35.6 C), Max:97.4 F (36.3 C)  Recent Labs  Lab 08/22/2018 0923 08/17/2018 1039 08/22/2018 1515  WBC 18.6*  --   --   CREATININE 11.83*  --  9.52*  LATICACIDVEN >11.0* >11.0* >11.0*    CrCl cannot be calculated (Unknown ideal weight.).    Allergies  Allergen Reactions  . No Known Allergies     Antimicrobials this admission: Zosyn 4/1>> Cefepime x 1 4/1 Vanc x 1 4/1  Dose adjustments this admission: N/A  Microbiology results: Pending  Thank you for allowing pharmacy to be a part of this patient's care.  Antonietta Jewel, PharmD, [place] Clinical Pharmacist  Pager: [phone] Phone: [phone] 08/06/2018 5:33 PM

## 2018-08-03 NOTE — Procedures (Signed)
[facility] Central Venous Catheter Insertion Procedure Note Mabel Unrein [id number] 1970-09-24  Procedure: Insertion of Central Venous Catheter Indications: Renal failure with hemodynamic instability, requiring CRRT  Procedure Details Consent: Unable to obtain consent because of emergent medical necessity. Time Out: Verified patient identification, verified procedure, site/side was marked, verified correct patient position, special equipment/implants available, medications/allergies/relevent history reviewed, required imaging and test results available.  Performed Patient received 2mg  Versed IV before initiation of procure.   Maximum sterile technique was used including antiseptics, cap, gloves, gown, hand hygiene, mask, sheet and face shield, N95 mask. Skin prep: Chlorhexidine; local anesthetic administered A antimicrobial bonded/coated triple lumen catheter was placed in the right internal jugular vein using the Seldinger technique.  Evaluation Blood flow good Complications: No apparent complications Patient did tolerate procedure well. Chest X-ray ordered to verify placement.  CXR: pending.  Cristal Generous MSN, AGACNP-BC  08/21/2018, 6:29 PM

## 2018-08-03 NOTE — Progress Notes (Signed)
RT NOTES: Critical ABG results given to RN

## 2018-08-03 NOTE — ED Triage Notes (Signed)
Pt here from dialysis center with c/o htn and resp distress , no fevers , pt has no exposure to COVID per him , no missed treatments and dialysis center stated that pt is 3 kilos light

## 2018-08-03 NOTE — Progress Notes (Signed)
Pharmacy Antibiotic Note  Cameron [name] is a 48 y.o. male admitted on 08/06/2018 with SOB and hypertension. Pharmacy has been consulted for zosyn dosing for sepsis. Pt is afebrile but WBC is elevated at 18.6. Pt with history of ESRD on HD.   Plan: Zosyn 3.375gm IV Q12H (4 hr inf) F/u renal plans, C&S, clinical status   Height: 5\' 4"  (162.6 cm) IBW/kg (Calculated) : 59.2  Temp (24hrs), Avg:97.3 F (36.3 C), Min:97.1 F (36.2 C), Max:97.4 F (36.3 C)  Recent Labs  Lab 08/06/2018 0923 08/24/2018 1039  WBC 18.6*  --   CREATININE 11.83*  --   LATICACIDVEN >11.0* >11.0*    CrCl cannot be calculated (Unknown ideal weight.).    Allergies  Allergen Reactions  . No Known Allergies     Antimicrobials this admission: Zosyn 4/1>> Cefepime x 1 4/1 Vanc x 1 4/1  Dose adjustments this admission: N/A  Microbiology results: Pending  Thank you for allowing pharmacy to be a part of this patient's care.  Reena Borromeo, Rande Lawman 08/22/2018 1:56 PM

## 2018-08-03 NOTE — Code Documentation (Signed)
CPR initiated

## 2018-08-03 NOTE — Procedures (Signed)
Arterial Catheter Insertion Procedure Note Yair Dusza [id number] 1971-03-28  Procedure: Insertion of Arterial Catheter  Indications: Blood pressure monitoring and Frequent blood sampling  Procedure Details Consent: Risks of procedure as well as the alternatives and risks of each were explained to the (patient/caregiver).  Consent for procedure obtained. Time Out: Verified patient identification, verified procedure, site/side was marked, verified correct patient position, special equipment/implants available, medications/allergies/relevent history reviewed, required imaging and test results available.  Performed  Maximum sterile technique was used including antiseptics, cap, gloves, gown, hand hygiene, mask and sheet. Skin prep: Chlorhexidine; local anesthetic administered 20 gauge catheter was inserted into right radial artery using the Seldinger technique. ULTRASOUND GUIDANCE USED: YES Evaluation Blood flow good; BP tracing good. Complications: No apparent complications.   Phillis Knack [facility] 08/25/2018

## 2018-08-03 NOTE — ED Provider Notes (Addendum)
[facility] Provider Note   CSN: [id number] Arrival date & time: 08/30/2018  0347    History   Chief Complaint Chief Complaint  Patient presents with   Shortness of Breath   Hypertension    HPI Cameron [name] is a 48 y.o. male.     Patient is a 48 year old male with end-stage renal disease on dialysis Monday Wednesday Friday, hypertension and long history of noncompliance with dialysis who is presenting today from the dialysis unit with shortness of breath.  Patient states he started feeling short of breath yesterday and it worsened today when he got to dialysis.  He has not had any cold like symptoms such as cough, congestion or fever.  He states he developed a minimal cough this morning with worsening shortness of breath.  Dialysis states he is actually 3 pounds under his dry weight and patient states he had a full course of dialysis on Monday.  He denies any chest pain, back pain, abdominal pain.  He has had no nausea or vomiting.  No prior history of PE.  He has not had any notable unilateral leg pain or swelling.  Pt dialyses at the [facility].  The history is provided by the patient.  Shortness of Breath  Severity:  Severe Onset quality:  Gradual Duration:  1 day Timing:  Constant Progression:  Worsening Chronicity:  Recurrent Context: not URI   Relieved by:  Nothing Worsened by:  Activity (lying down) Associated symptoms: cough   Associated symptoms: no fever   Associated symptoms comment:  Slight cough this morning only Hypertension  Associated symptoms include shortness of breath.    Past Medical History:  Diagnosis Date   CHF (congestive heart failure) ([place])    Hypertension    Meralgia paraesthetica, right 02/11/2017   Renal disorder    dialysis MWF    Patient Active Problem List   Diagnosis Date Noted   Acute respiratory failure ([place]) 06/06/2018   Meralgia paraesthetica, right 02/11/2017   Pulmonary  hypertension ([place]) 07/28/2016   Preventative health care 07/28/2016   Vitamin D deficiency 06/09/2016   Pain in both lower legs 06/04/2016   Anemia of renal disease 04/06/2016   Essential hypertension 04/03/2016   Gout 04/03/2016   ESRD needing dialysis ([place]) 03/30/2016   Combined congestive systolic and diastolic heart failure ([place]) 01/17/2016   Polysubstance abuse ([place])    Tobacco abuse     Past Surgical History:  Procedure Laterality Date   AV FISTULA PLACEMENT Left 04/07/2016   Procedure: Left  arm Brachiocephalic ARTERIOVENOUS (AV) FISTULA CREATION;  Surgeon: Conrad Norwalk, MD;  Location: [facility];  Service: Vascular;  Laterality: Left;   INSERTION OF DIALYSIS CATHETER Right 04/07/2016   Procedure: INSERTION OF DIALYSIS CATHETER;  Surgeon: Conrad Walnut, MD;  Location: [facility];  Service: Vascular;  Laterality: Right;   NO PAST SURGERIES          Home Medications    Prior to Admission medications   Medication Sig Start Date End Date Taking? Authorizing Provider  amoxicillin-clavulanate (AUGMENTIN) 500-125 MG tablet Take 1 tablet (500 mg total) by mouth daily. 06/09/18   Minor, Grace Bushy, NP  calcitRIOL (ROCALTROL) 0.5 MCG capsule Take 2 capsules (1 mcg total) by mouth every other day. Patient not taking: Reported on 06/06/2018 04/07/16   Holley Raring, MD  calcium acetate (PHOSLO) 667 MG capsule Take 3 capsules (2,001 mg total) by mouth 3 (three) times daily with meals. Patient not taking: Reported  on 06/06/2018 04/07/16   Holley Raring, MD  carbamazepine (TEGRETOL) 200 MG tablet Take 1 tablet (200 mg total) by mouth 2 (two) times daily. 02/11/17   Kathrynn Ducking, MD  carvedilol (COREG) 25 MG tablet Take 1 tablet (25 mg total) by mouth 2 (two) times daily with a meal. 04/07/16   Holley Raring, MD  cinacalcet (SENSIPAR) 30 MG tablet Take 2 tablets (60 mg total) by mouth daily with supper. 04/07/16   Holley Raring, MD  cloNIDine (CATAPRES) 0.3 MG tablet Take 0.3 mg by mouth  daily.     [provider]  gabapentin (NEURONTIN) 300 MG capsule Take 1 capsule (300 mg total) by mouth at bedtime. 07/28/16 06/06/18  Shela Leff, MD  hydrALAZINE (APRESOLINE) 100 MG tablet Take 1 tablet (100 mg total) by mouth 3 (three) times daily. 07/28/16   Shela Leff, MD  ibuprofen (ADVIL,MOTRIN) 200 MG tablet Take 200 mg by mouth every 6 (six) hours as needed for moderate pain.     [provider]    Family History Family History  Problem Relation Age of Onset   Kidney failure Mother        was on dialysis    Social History Social History   Tobacco Use   Smoking status: Former Smoker    Packs/day: 0.10    Years: 5.00    Pack years: 0.50    Types: Cigarettes   Smokeless tobacco: Never Used   Tobacco comment: quit  smoking 20 years ago "  Substance Use Topics   Alcohol use: No    Comment: Quit 20 years ago   Drug use: No    Comment: Former cocaine, marijuana, heroin use 20 years ago; denies IV drug use     Allergies   No known allergies   Review of Systems Review of Systems  Constitutional: Negative for fever.  Respiratory: Positive for cough and shortness of breath.   All other systems reviewed and are negative.    Physical Exam Updated Vital Signs BP (!) 233/177    Pulse (!) 126    Temp (!) 97.4 F (36.3 C) (Oral)    Resp (!) 33    SpO2 100%   Physical Exam Vitals signs and nursing note reviewed.  Constitutional:      General: He is in acute distress.     Appearance: He is well-developed. He is diaphoretic.  HENT:     Head: Normocephalic and atraumatic.  Eyes:     Conjunctiva/sclera: Conjunctivae normal.     Pupils: Pupils are equal, round, and reactive to light.  Neck:     Musculoskeletal: Normal range of motion and neck supple.     Vascular: JVD present.  Cardiovascular:     Rate and Rhythm: Regular rhythm. Tachycardia present.     Heart sounds: No murmur.  Pulmonary:     Effort: Tachypnea and accessory  muscle usage present. No respiratory distress.     Breath sounds: Rales present. No wheezing.  Abdominal:     General: There is no distension.     Palpations: Abdomen is soft.     Tenderness: There is no abdominal tenderness. There is no guarding or rebound.  Musculoskeletal: Normal range of motion.        General: No tenderness.     Right lower leg: He exhibits no tenderness. No edema.     Left lower leg: He exhibits no tenderness. No edema.  Skin:    General: Skin is warm.  Findings: No erythema or rash.  Neurological:     General: No focal deficit present.     Mental Status: He is alert and oriented to person, place, and time.  Psychiatric:        Behavior: Behavior normal.      ED Treatments / Results  Labs (all labs ordered are listed, but only abnormal results are displayed) Labs Reviewed  CBC WITH DIFFERENTIAL/PLATELET - Abnormal; Notable for the following components:      Result Value   WBC 18.6 (*)    RBC 4.06 (*)    Hemoglobin 8.8 (*)    HCT 33.8 (*)    MCH 21.7 (*)    MCHC 26.0 (*)    RDW 23.0 (*)    nRBC 1.8 (*)    Neutro Abs 14.9 (*)    Monocytes Absolute 1.4 (*)    Abs Immature Granulocytes 0.42 (*)    All other components within normal limits  LACTIC ACID, PLASMA - Abnormal; Notable for the following components:   Lactic Acid, Venous >11.0 (*)    All other components within normal limits  COMPREHENSIVE METABOLIC PANEL - Abnormal; Notable for the following components:   Chloride 95 (*)    CO2 12 (*)    Glucose, Bld 34 (*)    BUN 30 (*)    Creatinine, Ser 11.83 (*)    Total Protein 8.6 (*)    AST 68 (*)    Total Bilirubin 2.1 (*)    GFR calc non Af Amer 5 (*)    GFR calc Af Amer 5 (*)    Anion gap 38 (*)    All other components within normal limits  TROPONIN I - Abnormal; Notable for the following components:   Troponin I 0.08 (*)    All other components within normal limits  LACTIC ACID, PLASMA - Abnormal; Notable for the following  components:   Lactic Acid, Venous >11.0 (*)    All other components within normal limits  I-STAT TROPONIN, ED - Abnormal; Notable for the following components:   Troponin i, poc 0.16 (*)    All other components within normal limits  CBG MONITORING, ED - Abnormal; Notable for the following components:   Glucose-Capillary 29 (*)    All other components within normal limits  POCT I-STAT EG7 - Abnormal; Notable for the following components:   pH, Ven 7.156 (*)    pCO2, Ven 28.1 (*)    pO2, Ven 71.0 (*)    Bicarbonate 9.9 (*)    TCO2 11 (*)    Acid-base deficit 17.0 (*)    Calcium, Ion 0.91 (*)    HCT 30.0 (*)    Hemoglobin 10.2 (*)    All other components within normal limits  CBG MONITORING, ED - Abnormal; Notable for the following components:   Glucose-Capillary 66 (*)    All other components within normal limits  CBG MONITORING, ED - Abnormal; Notable for the following components:   Glucose-Capillary 122 (*)    All other components within normal limits  CULTURE, BLOOD (ROUTINE X 2)  CULTURE, BLOOD (ROUTINE X 2)  AMYLASE  LIPASE, BLOOD  CBG MONITORING, ED  CBG MONITORING, ED    EKG EKG Interpretation  Date/Time:  Wednesday August 03 2018 09:19:02 EDT Ventricular Rate:  125 PR Interval:    QRS Duration: 138 QT Interval:  356 QTC Calculation: 514 R Axis:   28 Text Interpretation:  Sinus tachycardia Right bundle branch block No significant change since last tracing  Confirmed by Blanchie Dessert [phone]) on 08/21/2018 9:25:09 AM   Radiology Dg Chest Port 1 View  Result Date: 08/14/2018 CLINICAL DATA:  Shortness of breath EXAM: PORTABLE CHEST 1 VIEW COMPARISON:  06/09/2018 FINDINGS: Cardiomegaly. No confluent opacities, effusions or edema. No acute bony abnormality. IMPRESSION: Cardiomegaly.  No active disease. Electronically Signed   By: Rolm Baptise M.D.   On: 08/07/2018 09:34    Procedures Procedure Name: Intubation Date/Time: 08/21/2018 1:41 PM Performed by: Blanchie Dessert, MD Pre-anesthesia Checklist: Patient identified, Patient being monitored, Emergency Drugs available, Timeout performed and Suction available Oxygen Delivery Method: Ambu bag Preoxygenation: Pre-oxygenation with 100% oxygen Induction Type: Rapid sequence Ventilation: Mask ventilation without difficulty Laryngoscope Size: Glidescope and 4 Tube size: 7.5 mm Number of attempts: 2 Airway Equipment and Method: Video-laryngoscopy Placement Confirmation: ETT inserted through vocal cords under direct vision,  CO2 detector and Breath sounds checked- equal and bilateral Secured at: 23 cm Tube secured with: ETT holder Dental Injury: Teeth and Oropharynx as per pre-operative assessment  Difficulty Due To: Difficulty was unanticipated Comments: Initial tube did not have a rigid stylet.  After stylet placed no difficulty with intubation    .Central Line Date/Time: 08/25/2018 1:42 PM Performed by: Blanchie Dessert, MD Authorized by: Blanchie Dessert, MD   Consent:    Consent obtained:  Emergent situation   Alternatives discussed:  No treatment Pre-procedure details:    Hand hygiene: Hand hygiene performed prior to insertion     Sterile barrier technique: All elements of maximal sterile technique followed     Skin preparation:  2% chlorhexidine   Skin preparation agent: Skin preparation agent completely dried prior to procedure   Anesthesia (see MAR for exact dosages):    Anesthesia method:  None Procedure details:    Location:  R femoral   Site selection rationale:  Currently undergoing CPR and needed access quickly   Patient position:  Flat   Procedural supplies:  Triple lumen   Landmarks identified: yes     Ultrasound guidance: no     Number of attempts:  2   Successful placement: yes   Post-procedure details:    Post-procedure:  Dressing applied and line sutured   Assessment:  Blood return through all ports and free fluid flow   Patient tolerance of procedure:  Tolerated  well, no immediate complications   (including critical care time)  Medications Ordered in ED Medications - No data to display   Initial Impression / Assessment and Plan / ED Course  I have reviewed the triage vital signs and the nursing notes.  Pertinent labs & imaging results that were available during my care of the patient were reviewed by me and considered in my medical decision making (see chart for details).       Patient with end-stage renal disease presenting today with worsening shortness of breath.  States the shortness of breath started yesterday and significantly worsened overnight.  Patient here is diaphoretic, tachypneic, tachycardic with diffuse rales and significant JVD.  He has a long history of noncompliance with dialysis but states he did have a full course of dialysis on Monday.  He also has a history of H CAP treated with antibiotics on 06/06/2018 but finished his course and has been doing well.  He has not had any recent URI symptoms, congestion, fever, cough.  He states only a mild cough developed this morning as the shortness of breath worsened.  Dialysis states he was 3 pounds under his dry weight and he denies  any chest pain.  Here patient is tachycardic to 125 but has a right bundle branch block on EKG that is not significantly different.  Concern for fluid overload at this time as patient is extremely hypertensive 233/117 he states he has not taken his medications.  Patient was started on BiPAP and a nitroglycerin drip.  Labs are pending.  Lower suspicion for COVID at this time given sx started yesterday and progressed rapidly without any URI sx prior. However we will also get a lactate and blood cultures in case.  Chest x-ray is also pending.  9:39 AM Chest x-ray showing cardiomegaly but no other acute findings.  Patient had an echo done a month and a half ago that showed no pericardial effusions and preserved EF of 65% with chamber dilation.  Patient subjectively feels  better on BiPAP but respiratory rate is in the mid 20s compared to the 30s. Concern for hypertensive emergency.  Patient remains tachycardic in the 130s.  It is a sinus tachycardia.  Usually takes coreg, clonidine and hydralazine and unclear when he last took them.  CBC is significant for leukocytosis of 18,000 and stable hemoglobin of 8.   10:39 AM Lactate >11 and Leukocytosis of 18,000.  Pt given dose of labetalol and NTG up to 71mcg.  Will get EG 7 and repeat Lactate.  Will also check rectal temp.  Blood sugar was 29 and pt given D50.  This significantly improved patient's heart rate and respiratory rate along with the other therapies.  Patient's blood pressure came down and the nitro drip was stopped.  However when attempting to wean him off BiPAP he became short of breath again.  Patient's repeat sugar was 78.  Patient states he had not been eating or drinking much in the last few days because he would vomit but denies any abdominal pain.  EG 7 and repeat lactate pending.  12:15 PM EEG 7 came back with a metabolic acidosis with pH of 7.15 bicarb of 9, patient was getting fluid bolus and then stated his abdomen hurt and coded.  Patient received 1 round of epi with return of pulse.  He was easy to bag and was slightly responsive however the decision was made to intubate.  Patient is getting IV fluid bolus at this time.  Repeat sugar was 66 and given a second amp of D50.  After intubation patient's blood pressure dropped once again and coded for short time which resolved after 1 of epi.  We will do a CT of chest abdomen pelvis to ensure patient has not dissected.  She was covered with broad-spectrum antibiotics Vanco and cefepime.  Will discuss with critical care.  Central line was placed and patient was started on norepi for persistent hypo-tension.  Patient sent to the CT scanner and coded again with immediate return of pulse with epi.  Patient started on an epi drip as well.  CRITICAL CARE Performed  by: Thadius Smisek Total critical care time: 90 minutes Critical care time was exclusive of separately billable procedures and treating other patients. Critical care was necessary to treat or prevent imminent or life-threatening deterioration. Critical care was time spent personally by me on the following activities: development of treatment plan with patient and/or surrogate as well as nursing, discussions with consultants, evaluation of patient's response to treatment, examination of patient, obtaining history from patient or surrogate, ordering and performing treatments and interventions, ordering and review of laboratory studies, ordering and review of radiographic studies, pulse oximetry and re-evaluation of  patient's condition.  Final Clinical Impressions(s) / ED Diagnoses   Final diagnoses:  Cardiac arrest ([place])  Metabolic acidosis  Lactic acidosis  Hypotension, unspecified hypotension type    ED Discharge Orders    None       Blanchie Dessert, MD 08/11/2018 1334    Blanchie Dessert, MD 08/15/2018 1343

## 2018-08-03 NOTE — Progress Notes (Signed)
  Echocardiogram 2D Echocardiogram has been performed.  Cameron [name] 08/05/2018, 4:55 PM

## 2018-08-03 NOTE — H&P (Addendum)
NAME:  Cameron [name], MRN:  [id number], DOB:  Dec 01, 1970, LOS: 0 ADMISSION DATE:  08/19/2018, CONSULTATION DATE:  4/1 REFERRING MD: Pl;unkett , CHIEF COMPLAINT:  Cardiac arrest    Brief History   esrd 47yom. Admitted 4/1 initially w/ nausea/vomiting hypoglycemia and hypertension w/ BP 233/177. PEA arrest witnessed x 4 in ER. Admitted to PCCM service  * apparently called Dialysis center on 27th w/ fever 102  History of present illness   48 year old male who presented acutely from HD center on 4/1 w/ cc: shortness of breath, x 24 hrs & HTN (arrived around 9am). No prodrome of cough, congestion or fever. Weight actually down 3 lbs from dry weight. No chest pain or pain, no N or V initially. In ER BP 233/177.  Became progressively hypoxic. Required NIPPV and ntg gtt (reported he had not taken his meds).  Complaining of new back pain and also reporting nausea and vomiting for 2 days. . Started on antihypertensives. 1035 wbc ct 18K, lactate > 11. Blood sugar was 29, got D50. Was given labetolol for on-going htn in spite of ntg gtt. 1215: hypotensive. Was getting fluid bolus. Reported abd hurt, then PEA arrest. S/p 1 round of CPR had ROSC.  Was subsequently intubated, PEA arrest X 2  More times. Each time ROSC after 1 round. Coded again in CT scan (4th time)    Past Medical History  ESRD M/W/F, non-compliant, recent admit w/ Hypertensive emergency and febrile illness (felt PNA and treated), discharged 2/3 HTN CM Significant Hospital Events   4/1 admitted w/ c/o nausea and vomiting, hypoglycemia, htn and SOB. Initially treated as hypertensive emergency and hypoglycemia. WOB worse. C/o abd pain. Cardiac arrest (PEA X 4) each w/ ROSC after 1 amp bicarb.   Consults:    Procedures:  oett 4/1>>> Femoral line 4/1>>>  Significant Diagnostic Tests:   1. No evidence of thoracic or abdominal aortic aneurysm or Dissection. 2. Cardiomegaly with right atrial enlargement, left ventricular hypertrophy and  right ventricular hypertrophy. Moderate to severe three-vessel coronary atherosclerosis. 3. Reflux of contrast into the IVC and hepatic veins indicating right heart failure and/or tricuspid valve disease. 4. Atelectasis involving the lower lobes. Mild perihilar ground-glass opacities in both lungs likely representing mild pulmonary edema. 5. No acute abnormalities involving the abdomen or pelvis. 6. Hepatomegaly without focal hepatic parenchymal abnormality. 7. Borderline to mild gallbladder wall thickening without evidence of calcified gallstones. This may be due to hepatic dysfunction but can be seen in chronic cholecystitis. 8. Possible hemodynamically significant stenosis involving the left common femoral artery. Does the patient have claudication?. 9. Small LEFT inguinal hernia containing fat. 10. Hemorrhage/ecchymosis/small hematoma in the RIGHT femoral region, likely related to the central venous catheter placement. The tip of the RIGHT femoral catheter is in the RIGHT common iliac vein. 11. Endotracheal tube tip approximately 2 cm above the carina.   Micro Data:  bcx 2 w 4/1>>> Sputum 4/1>>> UC 4/1>>> rvp 4/1>>> Influenza 4/1>>> covid 4/1>>> Antimicrobials:  vanc 4/1>>> Zosyn  4/1>>> Hydroxychloroquine 4/1>>> Interim history/subjective:  Sedated  Objective   Blood pressure (Abnormal) 163/104, pulse (Abnormal) 115, temperature (Abnormal) 97.1 F (36.2 C), temperature source Rectal, resp. rate (Abnormal) 39, height 5\' 4"  (1.626 m), SpO2 100 %.    Vent Mode: PRVC FiO2 (%):  [40 %-100 %] 100 % Set Rate:  [15 bmp] 15 bmp Vt Set:  [470 mL] 470 mL PEEP:  [5 cmH20] 5 cmH20  No intake or output data in the 24 hours  ending 08/16/2018 1238 There were no vitals filed for this visit.  Examination: General: chronically ill appearing now ventilated s/p 4 PEA arrests HENT: orally intubated  Lungs: decreased bases rapid RR  Cardiovascular: RRR no MRG Abdomen: soft not tender  + bowel sounds  Extremities: warm dry + pulses  Neuro: unresponsive  GU: due to void   Resolved Hospital Problem list    Assessment & Plan:   PEA arrest X 4 w/ circulatory shock ? Cardiogenic ? Sepsis  -responding to 1 round epi. Seems as though acidosis is major contributing factor ? Transient Gut ischemia  ?primary cardiac event ? Sepsis did have fever Friday but opted not to go to be evaluated Plan Admit to ICU F/u ct chest/abd pelvis Titrate Norepi for MAP >65 Bicarb gtt Serial CEs, 12 lead Pan cultures.  Empiric nosocomial coverage w/zosyn and vanc Sending COVID test influenza and RVP  Acute hypoxic respiratory failure seemingly exacerbated by profound acidosis and shock Plan Full vent support Maximize Ve for acidosis  Serial abg VAP bundle   Acute metabolic encephalopathy: 2/2 sevre metabolic derangements.  Plan Serial neuro checks Will refrain from hypothermia given brief arrest time  Aim for normal temps  Lactic acidosis s/po cardiac arrest and shock state Plan Serial chemistry  Serial lactate  Bicarb as above   ESRD Plan Nephrology consult No need for HD currently but will need CRRT in next 24 hrs likely  Diabetes w/ hypoglycemia Plan Serial cbgs  Anemia w/out evidence of bleeding Plan Trend cbc   Best practice:  Diet: NPO Pain/Anxiety/Delirium protocol (if indicated): 4/1 VAP protocol (if indicated): 4/1 DVT prophylaxis: [place] heparin GI prophylaxis: PPI Glucose control: serial cbg Mobility: br Code Status: full code  Family Communication: pending Disposition: critically ill. Etiology unclear for arrest. ? Primary cardiac event ? Transient gut ischemia. ? Infection/sepsis. Requires icu care for freq titration of vent/rr and fio2, titration of pressors and interp of metabolic derangements   Labs   CBC: Recent Labs  Lab 08/23/2018 0923 08/17/2018 1135  WBC 18.6*  --   NEUTROABS 14.9*  --   HGB 8.8* 10.2*  HCT 33.8* 30.0*  MCV 83.3  --    PLT 371  --     Basic Metabolic Panel: Recent Labs  Lab 08/22/2018 0923 08/14/2018 1135  NA 145 142  K 3.6 3.7  CL 95*  --   CO2 12*  --   GLUCOSE 34*  --   BUN 30*  --   CREATININE 11.83*  --   CALCIUM 9.3  --    GFR: CrCl cannot be calculated (Unknown ideal weight.). Recent Labs  Lab 08/15/2018 0923  WBC 18.6*  LATICACIDVEN >11.0*    Liver Function Tests: Recent Labs  Lab 08/10/2018 0923  AST 68*  ALT 34  ALKPHOS 91  BILITOT 2.1*  PROT 8.6*  ALBUMIN 4.1   No results for input(s): LIPASE, AMYLASE in the last 168 hours. No results for input(s): AMMONIA in the last 168 hours.  ABG    Component Value Date/Time   PHART 7.585 (H) 06/06/2018 2221   PCO2ART 29.3 (L) 06/06/2018 2221   PO2ART 127.0 (H) 06/06/2018 2221   HCO3 9.9 (L) 08/30/2018 1135   TCO2 11 (L) 08/30/2018 1135   ACIDBASEDEF 17.0 (H) 08/13/2018 1135   O2SAT 89.0 08/21/2018 1135     Coagulation Profile: No results for input(s): INR, PROTIME in the last 168 hours.  Cardiac Enzymes: Recent Labs  Lab 08/28/2018 0923  TROPONINI 0.08*  HbA1C: No results found for: HGBA1C  CBG: Recent Labs  Lab 08/13/2018 1050 08/28/2018 1113 08/13/2018 1139 08/09/2018 1209 08/16/2018 1218  GLUCAP 29* 73 86 66* 122*    Review of Systems:   Unable   Past Medical History  He,  has a past medical history of CHF (congestive heart failure) ([place]), Hypertension, Meralgia paraesthetica, right (02/11/2017), and Renal disorder.   Surgical History    Past Surgical History:  Procedure Laterality Date  . AV FISTULA PLACEMENT Left 04/07/2016   Procedure: Left  arm Brachiocephalic ARTERIOVENOUS (AV) FISTULA CREATION;  Surgeon: Conrad Saltville, MD;  Location: [facility];  Service: Vascular;  Laterality: Left;  . INSERTION OF DIALYSIS CATHETER Right 04/07/2016   Procedure: INSERTION OF DIALYSIS CATHETER;  Surgeon: Conrad Kidder, MD;  Location: [facility];  Service: Vascular;  Laterality: Right;  . NO PAST SURGERIES       Social History    reports that he has quit smoking. His smoking use included cigarettes. He has a 0.50 pack-year smoking history. He has never used smokeless tobacco. He reports that he does not drink alcohol or use drugs.   Family History   His family history includes Kidney failure in his mother.   Allergies Allergies  Allergen Reactions  . No Known Allergies      Home Medications  Prior to Admission medications   Medication Sig Start Date End Date Taking? Authorizing Provider  amoxicillin-clavulanate (AUGMENTIN) 500-125 MG tablet Take 1 tablet (500 mg total) by mouth daily. 06/09/18   Minor, Grace Bushy, NP  calcitRIOL (ROCALTROL) 0.5 MCG capsule Take 2 capsules (1 mcg total) by mouth every other day. Patient not taking: Reported on 06/06/2018 04/07/16   Holley Raring, MD  calcium acetate (PHOSLO) 667 MG capsule Take 3 capsules (2,001 mg total) by mouth 3 (three) times daily with meals. Patient not taking: Reported on 06/06/2018 04/07/16   Holley Raring, MD  carbamazepine (TEGRETOL) 200 MG tablet Take 1 tablet (200 mg total) by mouth 2 (two) times daily. 02/11/17   Kathrynn Ducking, MD  carvedilol (COREG) 25 MG tablet Take 1 tablet (25 mg total) by mouth 2 (two) times daily with a meal. 04/07/16   Holley Raring, MD  cinacalcet (SENSIPAR) 30 MG tablet Take 2 tablets (60 mg total) by mouth daily with supper. 04/07/16   Holley Raring, MD  cloNIDine (CATAPRES) 0.3 MG tablet Take 0.3 mg by mouth daily.     [provider]  gabapentin (NEURONTIN) 300 MG capsule Take 1 capsule (300 mg total) by mouth at bedtime. 07/28/16 06/06/18  Shela Leff, MD  hydrALAZINE (APRESOLINE) 100 MG tablet Take 1 tablet (100 mg total) by mouth 3 (three) times daily. 07/28/16   Shela Leff, MD  ibuprofen (ADVIL,MOTRIN) 200 MG tablet Take 200 mg by mouth every 6 (six) hours as needed for moderate pain.     [provider]     Critical care time: 32 min      Erick Colace ACNP-BC [place] Pager # [phone] OR # [phone] if no answer

## 2018-08-03 NOTE — ED Notes (Deleted)
Ortho and respiratory paged.

## 2018-08-03 NOTE — Consult Note (Signed)
Cardiology Consultation:   Patient ID: Cameron [name]; [id number]; 09-07-1970   Admit date: 08/09/2018 Date of Consult: 08/28/2018  Primary Care Provider: Corinne Ports, MD Primary Cardiologist: new to [place] Primary Electrophysiologist:  None   Patient Profile:   Cameron [name] is a 48 y.o. male with a PMH of  who is being seen today for the evaluation of diastolic CHF, HTN, and ESRD on HD with history of noncompliance, who presented with SOB and hypoglycemia with subsequent PEA arrest in the ED, who is being seen by cardiology at the request of Dr. Ander Slade.  History of Present Illness:   Cameron [name] is currently intubated and unable to participate in history taking. History obtained from chart review. He presented from the dialysis center 08/19/2018 with complaints of SOB. He reported symptoms began yesterday and worsened throughout the day today. While in the ED patient became progressively hypoxic requiring NIPPV. Also with complaints of lower abdominal pain, nausea, and vomiting x2 days. He denied complaints of fever (though did report a fever on Friday 3/27), cough, congestion, weight gain, leg pain/swelling, or recent missed dialysis. He has no prior CAD history. Last echo 06/2018 with EF 60-65%, concentric LV hypertrophy, and G2DD. No prior ischemic evaluation, though noted to have moderate to severe 3-vessel coronary atherosclerosis on CTA Chest today.   On arrival to the ED he was severely hypertensive to 233/177 in the setting of medication noncompliance. Labs notable for WBC 18, Lactate>11, blood glucose 29. He was started on a nitro gtt for hypertension and given additional labetalol. Subsequently BP dropped to 60s/20s, patient reported abdominal pain, and then PEA arrest s/p CPR with ROSC after 1 round. He had an additional 3 PEA arrests in the ED, each with ROSC after 1 round of CPR and 1 amp of bicarb. Currently intubated and admitted to [facility]. Cardiology asked to evaluate for  PEA arrest.    Initial chart biopsy, skeleton note by Roby Lofts, PA.  Italicized information is per the undersigned.  Brief Attending attestation: I saw the patient following transfer to the MICU (4M 14) --due to COVID-19 rule out regulations, full physical exam was not performed.  Past Medical History:  Diagnosis Date   CHF (congestive heart failure) ([place])    Hypertension    Meralgia paraesthetica, right 02/11/2017   Renal disorder    dialysis MWF    Past Surgical History:  Procedure Laterality Date   AV FISTULA PLACEMENT Left 04/07/2016   Procedure: Left  arm Brachiocephalic ARTERIOVENOUS (AV) FISTULA CREATION;  Surgeon: Conrad Bolivar, MD;  Location: [place];  Service: Vascular;  Laterality: Left;   INSERTION OF DIALYSIS CATHETER Right 04/07/2016   Procedure: INSERTION OF DIALYSIS CATHETER;  Surgeon: Conrad [place] Orange, MD;  Location: [place];  Service: Vascular;  Laterality: Right;   NO PAST SURGERIES       Home Medications:  Prior to Admission medications   Medication Sig Start Date End Date Taking? Authorizing Provider  amoxicillin-clavulanate (AUGMENTIN) 500-125 MG tablet Take 1 tablet (500 mg total) by mouth daily. 06/09/18   Minor, Grace Bushy, NP  calcitRIOL (ROCALTROL) 0.5 MCG capsule Take 2 capsules (1 mcg total) by mouth every other day. Patient not taking: Reported on 06/06/2018 04/07/16   Holley Raring, MD  calcium acetate (PHOSLO) 667 MG capsule Take 3 capsules (2,001 mg total) by mouth 3 (three) times daily with meals. Patient not taking: Reported on 06/06/2018 04/07/16   Holley Raring, MD  carbamazepine (TEGRETOL) 200 MG tablet Take  1 tablet (200 mg total) by mouth 2 (two) times daily. 02/11/17   Kathrynn Ducking, MD  carvedilol (COREG) 25 MG tablet Take 1 tablet (25 mg total) by mouth 2 (two) times daily with a meal. 04/07/16   Holley Raring, MD  cinacalcet (SENSIPAR) 30 MG tablet Take 2 tablets (60 mg total) by mouth daily with supper. 04/07/16   Holley Raring, MD    cloNIDine (CATAPRES) 0.3 MG tablet Take 0.3 mg by mouth daily.     [provider]  gabapentin (NEURONTIN) 300 MG capsule Take 1 capsule (300 mg total) by mouth at bedtime. 07/28/16 06/06/18  Shela Leff, MD  hydrALAZINE (APRESOLINE) 100 MG tablet Take 1 tablet (100 mg total) by mouth 3 (three) times daily. 07/28/16   Shela Leff, MD  ibuprofen (ADVIL,MOTRIN) 200 MG tablet Take 200 mg by mouth every 6 (six) hours as needed for moderate pain.     [provider]    Inpatient Medications: Scheduled Meds:  dextrose       fentaNYL       fentaNYL       heparin injection (subcutaneous)  5,000 Units Subcutaneous Q8H   hydroxychloroquine  400 mg Per Tube BID   Followed by   Derrill Memo ON 08-23-18] hydroxychloroquine  200 mg Oral BID   midazolam       midazolam       pantoprazole (PROTONIX) IV  40 mg Intravenous QHS   zinc sulfate  220 mg Per Tube Daily   Continuous Infusions:  epinephrine 2.5 mcg/min (08/21/2018 1337)   fentaNYL infusion INTRAVENOUS     norepinephrine (LEVOPHED) Adult infusion 40 mcg/min (08/23/2018 1347)   piperacillin-tazobactam     [START ON 08/23/18] piperacillin-tazobactam (ZOSYN)  IV      sodium bicarbonate  infusion 1000 mL 125 mL/hr at 08/06/2018 1347   vancomycin 1,500 mg (08/11/2018 1338)   PRN Meds: fentaNYL  Allergies:    Allergies  Allergen Reactions   No Known Allergies     Social History:   Social History   Socioeconomic History   Marital status: Married    Spouse name: Not on file   Number of children: 1   Years of education: 12   Highest education level: Not on file  Occupational History   Occupation: [employer] resource strain: Not on file   Food insecurity:    Worry: Not on file    Inability: Not on file   Transportation needs:    Medical: Not on file    Non-medical: Not on file  Tobacco Use   Smoking status: Former Smoker    Packs/day: 0.10    Years:  5.00    Pack years: 0.50    Types: Cigarettes   Smokeless tobacco: Never Used   Tobacco comment: quit  smoking 20 years ago "  Substance and Sexual Activity   Alcohol use: No    Comment: Quit 20 years ago   Drug use: No    Comment: Former cocaine, marijuana, heroin use 20 years ago; denies IV drug use   Sexual activity: Not on file  Lifestyle   Physical activity:    Days per week: Not on file    Minutes per session: Not on file   Stress: Not on file  Relationships   Social connections:    Talks on phone: Not on file    Gets together: Not on file    Attends religious service: Not on file  Active member of club or organization: Not on file    Attends meetings of clubs or organizations: Not on file    Relationship status: Not on file   Intimate partner violence:    Fear of current or ex partner: Not on file    Emotionally abused: Not on file    Physically abused: Not on file    Forced sexual activity: Not on file  Other Topics Concern   Not on file  Social History Narrative   Lives    Caffeine use: Drinks about 1 cup per day   Right handed     Family History:    Family History  Problem Relation Age of Onset   Kidney failure Mother        was on dialysis     ROS:  Please see the history of present illness.  Unable to perform --patient was intubated and sedated  Physical Exam/Data:   Vitals:   08/29/2018 1315 08/12/2018 1330 08/18/2018 1345 08/18/2018 1400  BP: (!) 67/39 (!) 138/96 133/74 (!) 136/95  Pulse:      Resp: 16 (!) 32 (!) 34 (!) 36  Temp:      TempSrc:      SpO2:      Height:       Due to COVID-19 restrictions and in an effort to minimize patient interaction, physical exam not completely performed.  Visual exam only.  No intake or output data in the 24 hours ending 08/21/2018 1438 There were no vitals filed for this visit. Body mass index is 31.48 kg/m.  General:  Well nourished, well developed.  Sedated. HEENT: Eyes are closed Neck: no  visible JVD Neuro/Psych:  : Intubated sedated.   EKG:  The EKG was personally reviewed and demonstrates:  Initial EKG with sinus tachycardia with RBBB/RVH TWI in V1-4 not seen on previous (likely related to pulmonary pattern with large R' / RBBB.Marland Kitchen --The initial postarrest EKG did have some T wave inversions not seen on follow-up EKGs. Telemetry:  Telemetry was personally reviewed and demonstrates: Sinus rhythm, RBBB.  Relevant CV Studies:  2D Echo from February 2020: Concentric LVH with GRII DD.  EF 60 to 65%.  No R WMA.  Mild LA dilation.  Mild aortic sclerosis.  Echo today performed (August 03, 2018): Personally reviewed, (I feel that the EF is probably more than 35%, closer to 40-45%)  EF 35% with diffuse hypokinesis.  No regional wall motion normalities.Marland Kitchen  GRII DD.  LVH.  Moderate least RV function with mild RA dilation and mildly elevated PA pressures.   Laboratory Data:  Chemistry Recent Labs  Lab 08/22/2018 0923 08/11/2018 1135  NA 145 142  K 3.6 3.7  CL 95*  --   CO2 12*  --   GLUCOSE 34*  --   BUN 30*  --   CREATININE 11.83*  --   CALCIUM 9.3  --   GFRNONAA 5*  --   GFRAA 5*  --   ANIONGAP 38*  --     Recent Labs  Lab 08/09/2018 0923  PROT 8.6*  ALBUMIN 4.1  AST 68*  ALT 34  ALKPHOS 91  BILITOT 2.1*   Hematology Recent Labs  Lab 08/13/2018 0923 08/06/2018 1135  WBC 18.6*  --   RBC 4.06*  --   HGB 8.8* 10.2*  HCT 33.8* 30.0*  MCV 83.3  --   MCH 21.7*  --   MCHC 26.0*  --   RDW 23.0*  --   PLT  371  --    Cardiac Enzymes Recent Labs  Lab 08/23/2018 0923  TROPONINI 0.08*    Recent Labs  Lab 08/10/2018 1132  TROPIPOC 0.16*    BNPNo results for input(s): BNP, PROBNP in the last 168 hours.  DDimer No results for input(s): DDIMER in the last 168 hours.  Radiology/Studies:  Dg Chest Port 1 View  Result Date: 08/13/2018 CLINICAL DATA:  Shortness of breath EXAM: PORTABLE CHEST 1 VIEW COMPARISON:  06/09/2018 FINDINGS: Cardiomegaly. No confluent opacities,  effusions or edema. No acute bony abnormality. IMPRESSION: Cardiomegaly.  No active disease. Electronically Signed   By: Rolm Baptise M.D.   On: 08/27/2018 09:34   Ct Angio Chest/abd/pel For Dissection W And/or Wo Contrast  Result Date: 08/30/2018 CLINICAL DATA:  48 year old presenting with acute onset chest pain radiating into the back. Evaluate for aortic dissection. Current history of end-stage renal disease on hemodialysis. Current history of CHF. It is documented that the patient had cardiorespiratory arrest during the CT examination. EXAM: CT ANGIOGRAPHY CHEST, ABDOMEN AND PELVIS TECHNIQUE: Initially, multidetector CT through the chest was performed prior to IV contrast administration. Subsequently, multidetector CT imaging through the chest, abdomen and pelvis was performed using the standard protocol during bolus administration of intravenous contrast. Multiplanar reconstructed images and MIPs were obtained and reviewed to evaluate the vascular anatomy. CONTRAST:  124mL OMNIPAQUE IOHEXOL 350 MG/ML IV. COMPARISON:  CTA chest for pulmonary embolism 06/08/2018. No prior abdominopelvic CT. FINDINGS: CTA CHEST FINDINGS Cardiovascular: No evidence of mural hematoma on the unenhanced images. Gas is present in the independent portion of the RIGHT atrium related to air admitted via the peripheral IV. No evidence of thoracic aortic dissection. Moderate atherosclerosis involving the thoracic aorta without evidence of aneurysm. Atherosclerosis at the origin of the great vessels without evidence of hemodynamically significant stenosis. Bovine aortic arch anatomy (LEFT common carotid artery arises from the innominate artery). Heart markedly enlarged with RIGHT atrial enlargement in particular. LEFT ventricular hypertrophy and RIGHT ventricular hypertrophy. Moderate to severe three-vessel coronary atherosclerosis. No pericardial effusion. Reflux of contrast into the IVC and hepatic veins. Mediastinum/Nodes: No  pathologically enlarged mediastinal, hilar or axillary lymph nodes. No mediastinal masses. Normal-appearing esophagus. Asymmetric enlargement of the LEFT lobe of the thyroid gland containing multiple small subcentimeter nodules. Lungs/Pleura: Endotracheal tube tip approximately 2 cm above the carina. Respiratory motion blurs the images throughout. Atelectasis involving the lower lobes. Hazy ground-glass opacities in the perihilar location of both lungs. No pleural effusions. Musculoskeletal: Minimal thoracic spondylosis. Other: Mild BILATERAL gynecomastia. Review of the MIP images confirms the above findings. CTA ABDOMEN AND PELVIS FINDINGS VASCULAR Aorta: Moderate atherosclerosis without evidence of aneurysm or dissection. Celiac: Atherosclerotic though widely patent. SMA: Atherosclerotic though widely patent. Renals: Single renal arteries bilaterally demonstrating atherosclerosis but no visible hemodynamically significant stenoses. IMA: Atherosclerotic with diminished opacification indicating a significant origin stenosis. Inflow: Severe iliofemoral atherosclerosis bilaterally. Possible hemodynamically significant stenosis involving the LEFT common femoral artery. Veins: Not evaluated. Review of the MIP images confirms the above findings. NON-VASCULAR Hepatobiliary: Mild hepatomegaly. No focal hepatic parenchymal abnormality. Borderline to mild gallbladder wall thickening without evidence of calcified gallstones. Pancreas: Normal in appearance without evidence of mass, ductal dilation, or inflammation. Spleen: Normal in size and appearance. Adrenals/Urinary Tract: Normal appearing adrenal glands. Diminished opacification of the kidneys indicating chronic renal failure. No focal parenchymal abnormality involving either kidney. No hydronephrosis. No urinary tract calculi. Completely decompressed urinary bladder which I believe accounts for the apparent wall thickening. Stomach/Bowel: Stomach normal in appearance  for  the degree of distention. Normal-appearing small bowel. Entire colon decompressed which I believe accounts for the apparent diffuse wall thickening. Normal appendix in the RIGHT UPPER pelvis. Lymphatic: No pathologic lymphadenopathy. Reproductive: Prostate gland and seminal vesicles normal in size and appearance for age. Other: Hemorrhage/ecchymosis/small hematoma in the RIGHT femoral region, likely related to the central venous catheter placement. The RIGHT femoral central venous catheter tip is in the RIGHT common iliac vein. Small LEFT inguinal hernia containing fat. Musculoskeletal: Regional skeleton unremarkable without acute or significant osseous abnormality. Review of the MIP images confirms the above findings. IMPRESSION: 1. No evidence of thoracic or abdominal aortic aneurysm or dissection. 2. Cardiomegaly with right atrial enlargement, left ventricular hypertrophy and right ventricular hypertrophy. Moderate to severe three-vessel coronary atherosclerosis. 3. Reflux of contrast into the IVC and hepatic veins indicating right heart failure and/or tricuspid valve disease. 4. Atelectasis involving the lower lobes. Mild perihilar ground-glass opacities in both lungs likely representing mild pulmonary edema. 5. No acute abnormalities involving the abdomen or pelvis. 6. Hepatomegaly without focal hepatic parenchymal abnormality. 7. Borderline to mild gallbladder wall thickening without evidence of calcified gallstones. This may be due to hepatic dysfunction but can be seen in chronic cholecystitis. 8. Possible hemodynamically significant stenosis involving the left common femoral artery. Does the patient have claudication?. 9. Small LEFT inguinal hernia containing fat. 10. Hemorrhage/ecchymosis/small hematoma in the RIGHT femoral region, likely related to the central venous catheter placement. The tip of the RIGHT femoral catheter is in the RIGHT common iliac vein. 11. Endotracheal tube tip approximately 2 cm  above the carina. Aortic Atherosclerosis (ICD10-170.0) Electronically Signed   By: Evangeline Dakin M.D.   On: 08/11/2018 13:41    Assessment and Plan:   1. PEA arrest in patient with evidence of moderate-severe 3-vessel disease on CTA Chest: patient presented with SOB and hypertensive emergency, and metabolic acidosis. After receiving a nitro gtt and IV labetalol patient had subsequent hypotension with BP in the 60s/20s, followed by PEA arrest x4 in the ED, each with ROSC after 1 round of CPR and 1 amp of bicarb. -->  In light of everything else going on, the most likely etiology for PEA is metabolic acidosis.  He does have evidence of cardiac dysfunction on echo and has three-vessel calcification noted on CT which is not new.  Initial EKG with sinus tachycardia with TWI in V1-4 (new). Trop 0.08>0.16 -consistent with post arrest and renal failure.  Echo reviewed.  Globally down.  Not unexpected initially postarrest.  I doubt this is ACS related.  Probably more related to postarrest myopathy. Currently intubated, on pressors, and IV antibiotics.  - Continue pressors per PCCM - Continue to trend trop to peak - Continue infectious work-up per primary team  Will probably eventually need an ischemic evaluation pending resolution of his current condition.  May or may not consider this as an inpatient depending on his progress  2. Acute hypoxic respiratory failure in the setting of acidosis and shock: currently intubated. Concern for possible COVID-19 with send-out test ordered. Started on IV antibiotics and hydroxychloroquine  - Continue management per PCCM  3. Hypertension: patient initially presented with hypertensive emergency with BP 233/177 in the setting of medication noncompliance, with subsequent hypotension after receiving IV nitro and IV labetalol. Currently being maintained on pressors - Continue pressors per PCCM - Resume home medications once condition improves  4. ESRD on HD:  Nephrology to be consulted -I suspect he may very well benefit from earlier rather  than later dialysis based on his severe metabolic acidosis. - Continue management per nephrology  Note Template & Chart Biopsy per Abigail Butts., PA-CPhysician Assistant Certified   I personally saw and evaluated the patient.  And agree with the note above.  Please see my annotations in [place].  Metabolic acidosis, likely related to renal failure and probably incomplete dialysis.  Likely etiology for PEA arrest.  Cardiomyopathy on echocardiogram again most likely consistent with post arrest.  Will eventually need ischemic evaluation, but not until we have ruled out the possibility of COVID-19, and adequately treated acidosis.  Continue to assess on daily basis.   Glenetta Hew, M.D., M.S. Interventional Cardiologist   Pager # [phone] Phone # [phone] [address] [place], [place] [address]     For questions or updates, please contact [place] Please consult [email or web address] for contact info under Cardiology/STEMI.

## 2018-08-03 NOTE — Progress Notes (Signed)
Physician notified: Ol[place]re At: [id number]  Regarding: ABG: HCO3 10.1 Critical lab: LA > 11.0   Awaiting return response.   Returned Response at: 1627  Order(s): Will place fluid orders in Epic.

## 2018-08-04 ENCOUNTER — Inpatient Hospital Stay (HOSPITAL_COMMUNITY): Payer: Medicare Other

## 2018-08-04 DIAGNOSIS — G9340 Encephalopathy, unspecified: Secondary | ICD-10-CM

## 2018-08-04 DIAGNOSIS — R570 Cardiogenic shock: Secondary | ICD-10-CM

## 2018-08-04 LAB — POCT I-STAT 7, (LYTES, BLD GAS, ICA,H+H)
Acid-Base Excess: 7 mmol/L — ABNORMAL HIGH (ref 0.0–2.0)
Acid-base deficit: 9 mmol/L — ABNORMAL HIGH (ref 0.0–2.0)
Bicarbonate: 29.9 mmol/L — ABNORMAL HIGH (ref 20.0–28.0)
Bicarbonate: 16.8 mmol/L — ABNORMAL LOW (ref 20.0–28.0)
Calcium, Ion: 0.96 mmol/L — ABNORMAL LOW (ref 1.15–1.40)
Calcium, Ion: 1.28 mmol/L (ref 1.15–1.40)
HCT: 29 % — ABNORMAL LOW (ref 39.0–52.0)
HCT: 27 % — ABNORMAL LOW (ref 39.0–52.0)
Hemoglobin: 9.9 g/dL — ABNORMAL LOW (ref 13.0–17.0)
Hemoglobin: 9.2 g/dL — ABNORMAL LOW (ref 13.0–17.0)
O2 Saturation: 100 %
O2 Saturation: 100 %
Patient temperature: 98.6
Patient temperature: 91.5
Potassium: 4.3 mmol/L (ref 3.5–5.1)
Potassium: 3.8 mmol/L (ref 3.5–5.1)
Sodium: 141 mmol/L (ref 135–145)
Sodium: 137 mmol/L (ref 135–145)
TCO2: 31 mmol/L (ref 22–32)
TCO2: 18 mmol/L — ABNORMAL LOW (ref 22–32)
pCO2 arterial: 35.3 mmHg (ref 32.0–48.0)
pCO2 arterial: 30.6 mmHg — ABNORMAL LOW (ref 32.0–48.0)
pH, Arterial: 7.535 — ABNORMAL HIGH (ref 7.350–7.450)
pH, Arterial: 7.328 — ABNORMAL LOW (ref 7.350–7.450)
pO2, Arterial: 314 mmHg — ABNORMAL HIGH (ref 83.0–108.0)
pO2, Arterial: 311 mmHg — ABNORMAL HIGH (ref 83.0–108.0)

## 2018-08-04 LAB — RENAL FUNCTION PANEL
Albumin: 3.3 g/dL — ABNORMAL LOW (ref 3.5–5.0)
Anion gap: 18 — ABNORMAL HIGH (ref 5–15)
BUN: 24 mg/dL — ABNORMAL HIGH (ref 6–20)
CO2: 23 mmol/L (ref 22–32)
Calcium: 7.9 mg/dL — ABNORMAL LOW (ref 8.9–10.3)
Chloride: 97 mmol/L — ABNORMAL LOW (ref 98–111)
Creatinine, Ser: 7.79 mg/dL — ABNORMAL HIGH (ref 0.61–1.24)
GFR calc Af Amer: 9 mL/min — ABNORMAL LOW (ref >60)
GFR calc non Af Amer: 8 mL/min — ABNORMAL LOW (ref >60)
Glucose, Bld: 109 mg/dL — ABNORMAL HIGH (ref 70–99)
Phosphorus: 3.6 mg/dL (ref 2.5–4.6)
Potassium: 4.2 mmol/L (ref 3.5–5.1)
Sodium: 138 mmol/L (ref 135–145)

## 2018-08-04 LAB — CBC
HCT: 28.1 % — ABNORMAL LOW (ref 39.0–52.0)
Hemoglobin: 8 g/dL — ABNORMAL LOW (ref 13.0–17.0)
MCH: 22.2 pg — ABNORMAL LOW (ref 26.0–34.0)
MCHC: 28.5 g/dL — ABNORMAL LOW (ref 30.0–36.0)
MCV: 78.1 fL — ABNORMAL LOW (ref 80.0–100.0)
Platelets: 156 10*3/uL (ref 150–400)
RBC: 3.6 MIL/uL — ABNORMAL LOW (ref 4.22–5.81)
RDW: 22.9 % — ABNORMAL HIGH (ref 11.5–15.5)
WBC: 19.8 10*3/uL — ABNORMAL HIGH (ref 4.0–10.5)
nRBC: 4.5 % — ABNORMAL HIGH (ref 0.0–0.2)

## 2018-08-04 LAB — GLUCOSE, CAPILLARY
Glucose-Capillary: 120 mg/dL — ABNORMAL HIGH (ref 70–99)
Glucose-Capillary: 104 mg/dL — ABNORMAL HIGH (ref 70–99)
Glucose-Capillary: 43 mg/dL — CL (ref 70–99)
Glucose-Capillary: 25 mg/dL — CL (ref 70–99)
Glucose-Capillary: 257 mg/dL — ABNORMAL HIGH (ref 70–99)
Glucose-Capillary: 16 mg/dL — CL (ref 70–99)
Glucose-Capillary: 119 mg/dL — ABNORMAL HIGH (ref 70–99)

## 2018-08-04 LAB — APTT: aPTT: 39 seconds — ABNORMAL HIGH (ref 24–36)

## 2018-08-04 LAB — LACTIC ACID, PLASMA
Lactic Acid, Venous: 7.3 mmol/L (ref 0.5–1.9)
Lactic Acid, Venous: 8.6 mmol/L (ref 0.5–1.9)

## 2018-08-04 MED ORDER — CHLORHEXIDINE GLUCONATE CLOTH 2 % EX PADS: 6.0000 | MEDICATED_PAD | Freq: Every day | CUTANEOUS | Status: DC

## 2018-08-04 MED ORDER — DEXTROSE 50 % IV SOLN
50.0000 mL | INTRAVENOUS | Status: AC
  Administered 2018-08-04: 14:00:00 50 mL via INTRAVENOUS

## 2018-08-04 MED ORDER — SODIUM CHLORIDE 0.9% FLUSH
10.0000 mL | Freq: Two times a day (BID) | INTRAVENOUS | Status: DC
  Administered 2018-08-04 (×2): 10 mL

## 2018-08-04 MED ORDER — MIDAZOLAM HCL 2 MG/2ML IJ SOLN
1.0000 mg | INTRAMUSCULAR | Status: DC | PRN
  Administered 2018-08-04: 05:00:00 2 mg via INTRAVENOUS
  Filled 2018-08-04: qty 2

## 2018-08-04 MED ORDER — PRO-STAT SUGAR FREE PO LIQD
30.0000 mL | Freq: Three times a day (TID) | ORAL | Status: DC
  Administered 2018-08-04: 30 mL
  Filled 2018-08-04: qty 30

## 2018-08-04 MED ORDER — SODIUM CHLORIDE 0.9% FLUSH: 10.0000 mL | INTRAVENOUS | Status: DC | PRN

## 2018-08-04 MED ORDER — DEXTROSE 50 % IV SOLN
25.0000 mL | INTRAVENOUS | Status: AC
  Administered 2018-08-04: 25 mL via INTRAVENOUS
  Filled 2018-08-04: qty 50

## 2018-08-04 MED ORDER — CHLORHEXIDINE GLUCONATE 0.12% ORAL RINSE (MEDLINE KIT)
15.0000 mL | Freq: Two times a day (BID) | OROMUCOSAL | Status: DC
  Administered 2018-08-04: 15 mL via OROMUCOSAL

## 2018-08-04 MED ORDER — NOREPINEPHRINE 4 MG/250ML-% IV SOLN
INTRAVENOUS | Status: AC
  Administered 2018-08-04: 11:00:00 40 ug/min via INTRAVENOUS
  Filled 2018-08-04: qty 250

## 2018-08-04 MED ORDER — NOREPINEPHRINE 16 MG/250ML-% IV SOLN
0.0000 ug/min | INTRAVENOUS | Status: DC
  Filled 2018-08-04: qty 250

## 2018-08-04 MED ORDER — DEXMEDETOMIDINE HCL IN NACL 400 MCG/100ML IV SOLN: 0.4000 ug/kg/h | INTRAVENOUS | Status: DC

## 2018-08-04 MED ORDER — DEXTROSE 50 % IV SOLN
INTRAVENOUS | Status: AC
  Administered 2018-08-04: 50 mL
  Filled 2018-08-04: qty 100

## 2018-08-04 MED ORDER — OXYCODONE HCL 5 MG PO TABS: 10.0000 mg | ORAL_TABLET | Freq: Four times a day (QID) | ORAL | Status: DC | PRN

## 2018-08-04 MED ORDER — DEXTROSE 50 % IV SOLN
INTRAVENOUS | Status: AC
  Filled 2018-08-04: qty 100

## 2018-08-04 MED ORDER — VITAL 1.5 CAL PO LIQD
1000.0000 mL | ORAL | Status: DC
  Administered 2018-08-04: 14:00:00 1000 mL
  Filled 2018-08-04 (×2): qty 1000

## 2018-08-04 MED ORDER — ORAL CARE MOUTH RINSE
15.0000 mL | OROMUCOSAL | Status: DC
  Administered 2018-08-04 (×3): 15 mL via OROMUCOSAL

## 2018-08-06 LAB — NOVEL CORONAVIRUS, NAA (HOSP ORDER, SEND-OUT TO REF LAB; TAT 18-24 HRS): SARS-CoV-2, NAA: NOT DETECTED

## 2018-08-08 LAB — CULTURE, BLOOD (ROUTINE X 2)
Culture: NO GROWTH
Culture: NO GROWTH

## 2018-08-09 ENCOUNTER — Telehealth: Payer: Self-pay | Admitting: *Deleted

## 2018-08-09 LAB — DRUG SCREEN 10 W/CONF, SERUM
Amphetamines, IA: NEGATIVE ng/mL
Barbiturates, IA: NEGATIVE ug/mL
Benzodiazepines, IA: POSITIVE ng/mL
Cocaine & Metabolite, IA: NEGATIVE ng/mL
Methadone, IA: NEGATIVE ng/mL
Opiates, IA: NEGATIVE ng/mL
Oxycodones, IA: NEGATIVE ng/mL
Phencyclidine, IA: NEGATIVE ng/mL
Propoxyphene, IA: NEGATIVE ng/mL
THC(Marijuana) Metabolite, IA: NEGATIVE ng/mL

## 2018-08-09 LAB — BENZODIAZEPINES,MS,WB/SP RFX
7-Aminoclonazepam: NEGATIVE ng/mL
Alprazolam: NEGATIVE ng/mL
Benzodiazepines Confirm: POSITIVE
Chlordiazepoxide: NEGATIVE ng/mL
Clonazepam: NEGATIVE ng/mL
Desalkylflurazepam: NEGATIVE ng/mL
Desmethylchlordiazepoxide: NEGATIVE ng/mL
Desmethyldiazepam: NEGATIVE ng/mL
Diazepam: NEGATIVE ng/mL
Flurazepam: NEGATIVE ng/mL
Lorazepam: NEGATIVE ng/mL
Midazolam: 28 ng/mL
Oxazepam: NEGATIVE ng/mL
Temazepam: NEGATIVE ng/mL
Triazolam: NEGATIVE ng/mL

## 2018-08-09 NOTE — Telephone Encounter (Signed)
Received Original D/C from [facility]  ,D/C forwarded to Dr.Yacoub on 67M for signature.

## 2018-08-17 ENCOUNTER — Telehealth: Payer: Self-pay

## 2018-08-17 NOTE — Telephone Encounter (Signed)
Received signed dc back from Doctor Sood. Called funeral home to let them know dc is ready for pickup and also faxed a copy to the funeral home per the funeral home request.

## 2018-09-02 NOTE — Progress Notes (Signed)
Pt death pronounced at 23 by Eliseo Gum NP. Pt's Partner Janne Lab and Sister Cristofher Livecchi, Notified. No funeral home in mind at the moment. Pt family given pt placement number to follow up with funeral home. All further questions from family.

## 2018-09-02 NOTE — Progress Notes (Signed)
   2018-08-16 1000  Clinical Encounter Type  Visited With Patient  Visit Type Initial;Spiritual support  Referral From Nurse  Consult/Referral To Nurse  Spiritual Encounters  Spiritual Needs Other (Comment)   Responded to Code blue. PT is in isolation room with staff. I was outside along with other staff. Nurse stated that an update was needed for PT contact information. AC is in process to contact family. [facility] dismissed everyone not directly working with PT. I offered spiritual care with [facility], and silent prayer to PT and staff.  Chaplain Fidel Levy [phone]

## 2018-09-02 NOTE — Progress Notes (Signed)
PCCM Significant Event Note    PCCM called to bedside to evaluate patient, who nursing identified to be without a pulse and without a blood pressure. The patient had PEA arrested 6x in the past 24 hours and the patient's code status had been updated to DNR earlier today.   I personally examined the patient.  There is no chest excursion. There are no respiratory sounds for >1 minute.  There are no carotid pulses, no femoral pulses, no radial pulses and no heart sounds to auscultation for > 1  Minute. Neuro exam reveals fixed, dilated pupils. There is no response to painful stimuli. There is no gag reflex.    Time of Death declared at [place] MSN, AGACNP-BC  [place] [id number] If no answer, [phone] Aug 23, 2018, 2:48 PM

## 2018-09-02 NOTE — Progress Notes (Signed)
eLink Physician-Brief Progress Note Patient Name: Cameron [name] DOB: [birth year] MRN: [id number]   Date of Service  2018-08-26  HPI/Events of Note  Agitation   eICU Interventions  Will order: 1. Versed 1-2 mg IV Q 1 hour PRN agitation or sedation.         Judah Chevere Eugene August 26, 2018, 4:19 AM

## 2018-09-02 NOTE — Significant Event (Cosign Needed)
2018-08-14 12:15 pm Coded for 7 th time  Epinephrine drip at 30, NACHO3 x 1, compressions x 45 seconds with ROSC. Spoke with Janalyn Rouse significant other, unable to reach sister. We have reached futile care threshold and we will change him to DNR status with with no escalation of care. Have allowed dying visitation despite hospital lock down for COVID [place] ACNP Maryanna Shape PCCM Pager [phone] till 1 pm If no answer page [phone] 14-Aug-2018, 12:26 PM

## 2018-09-02 NOTE — Progress Notes (Signed)
   08-28-18 1212  Clinical Encounter Type  Visited With Health care provider  Visit Type Initial;Code;Spiritual support  Referral From Nurse  Consult/Referral To Chaplain  The chaplain responded to Pt. Code Blue for Pt. and staff spiritual support.  The chaplain checked in with the medical team upon arrival.  The chaplain understands from communication with the AC-Ron, attempts have been made to contact the Pt. family.  The chaplain was not pastorally present with the Pt. The chaplain is available for F/U spiritual care as needed.

## 2018-09-02 NOTE — Progress Notes (Signed)
VAST arrived for code blue, patient has central line and no IV team needs.  Excused by Sherle Poe, RN Hydrographic surveyor.  Did not enter room as patient is on COVID precautions.  Carolee Rota, RN VAST

## 2018-09-02 NOTE — Progress Notes (Addendum)
Initial Nutrition Assessment RD working remotely.  DOCUMENTATION CODES:   Not applicable  INTERVENTION:    Vital 1.5 at 45 ml/h (1080 ml per day)  Pro-stat 30 ml TID  Provides 1920 kcal, 118 gm protein, 825 ml free water daily  NUTRITION DIAGNOSIS:   Inadequate oral intake related to inability to eat as evidenced by NPO status.  GOAL:   Provide needs based on ASPEN/SCCM guidelines  MONITOR:   Vent status, TF tolerance, Labs, I & O's, Weight trends  REASON FOR ASSESSMENT:   Ventilator, Consult Enteral/tube feeding initiation and management  ASSESSMENT:   48 yo male with PMH of CHF, HTN, meralgia paraesthetica, ESRD-HD who was admitted from HD center (did not receive HD) with SOB, N/V, fever last week. In ED, PEA arrest x 4. Required intubation.   Currently requiring CRRT.  Unsure of EDW. Per Nephrology notes, patient was above EDW by 1 kg yesterday.  Non-pitting edema present to BLE per RN assessment on admission.   Patient is currently intubated on ventilator support MV: 13.7 L/min Temp (24hrs), Avg:96.5 F (35.8 C), Min:96.1 F (35.6 C), Max:97.1 F (36.2 C)   Labs reviewed. Potassium 4.2 WNL, phosphorus 3.6 WNL, creatinine 7.79 (H), BUN 24 (H) CBG's: [phone] COVID-19 results pending  Medications reviewed and include plaquenil, zinc, precedex, epinephrine, levophed. Home meds include calcitriol, phoslo, sensipar.  NUTRITION - FOCUSED PHYSICAL EXAM:  unable to complete  Diet Order:   Diet Order            Diet NPO time specified  Diet effective now              EDUCATION NEEDS:   No education needs have been identified at this time  Skin:  Skin Assessment: Reviewed RN Assessment  Last BM:  none documented since admission  Height:   Ht Readings from Last 1 Encounters:  08/29/2018 5\' 4"  (1.626 m)    Weight:   Wt Readings from Last 1 Encounters:  August 14, 2018 84 kg  Unsure of EDW  Ideal Body Weight:  59.1 kg  BMI:  Body mass index  is 31.79 kg/m.  Estimated Nutritional Needs:   Kcal:  1910  Protein:  105-125 gm  Fluid:  1 L +UOP    Molli Barrows, RD, LDN, [place] Pager [phone] After Hours Pager [phone]

## 2018-09-02 NOTE — Progress Notes (Signed)
eLink Physician-Brief Progress Note Patient Name: Asriel Westrup DOB: October 28, 1970 MRN: [id number]   Date of Service  2018-08-09  HPI/Events of Note  Lactic Acid = > 11.0 --> > 11.0 --> 8.6. COOX = 87.7. Lactic Acid improving.   eICU Interventions  Continue present management.      Intervention Category Major Interventions: Acid-Base disturbance - evaluation and management  Mikenzi Raysor Eugene 2018/08/09, 1:02 AM

## 2018-09-02 NOTE — Progress Notes (Signed)
eLink Physician-Brief Progress Note Patient Name: Cameron [name] DOB: [birth year] MRN: [id number]   Date of Service  08/09/18  HPI/Events of Note  Multiple issues: 1. Blood glucose = 120 and 2. ABG = 7.53/35.3/314/29.1  eICU Interventions  Will order: 1. Change blood glucose to Q 4 hours. 2. D/C NaHCO3 IV infusion.      Intervention Category Major Interventions: Acid-Base disturbance - evaluation and management;Respiratory failure - evaluation and management;Hyperglycemia - active titration of insulin therapy  Sommer,Steven Cornelia Copa 08-09-18, 3:12 AM

## 2018-09-02 NOTE — Progress Notes (Signed)
[facility] Progress Note  Subjective: pt was on CRRT overnight, pH corrected to 7.5 so IV bicarb gtt was stopped.  Am labs ok, normal K and CO2.  Had cardiac arrest this am , PEA.  Had 3 short codes in ED and 1 in CT yesterday. Today had epi x 2, Ca/ D50 w/ ROSC, placed on levo gtt.   Vitals:   Aug 30, 2018 0500 August 30, 2018 0600 08/30/2018 0700 Aug 30, 2018 0753  BP: 118/83 115/90 112/87 (!) 121/96  Pulse: 67   90  Resp: (!) 30 (!) 30 (!) 30 (!) 30  Temp:      TempSrc:      SpO2: 100%     Weight:      Height:        Inpatient medications: . chlorhexidine gluconate (MEDLINE KIT)  15 mL Mouth Rinse BID  . Chlorhexidine Gluconate Cloth  6 each Topical Daily  . feeding supplement (PRO-STAT SUGAR FREE 64)  30 mL Per Tube TID  . heparin injection (subcutaneous)  5,000 Units Subcutaneous Q8H  . hydroxychloroquine  200 mg Oral BID  . mouth rinse  15 mL Mouth Rinse 10 times per day  . pantoprazole (PROTONIX) IV  40 mg Intravenous QHS  . sodium chloride flush  10-40 mL Intracatheter Q12H  . zinc sulfate  220 mg Per Tube Daily   .  prismasol BGK 4/2.5    .  prismasol BGK 4/2.5    . dexmedetomidine (PRECEDEX) IV infusion    . epinephrine 8 mcg/min (08-30-2018 1133)  . feeding supplement (VITAL 1.5 CAL)    . fentaNYL infusion INTRAVENOUS 400 mcg/hr (30-Aug-2018 0600)  . norepinephrine (LEVOPHED) Adult infusion 40 mcg/min (08/30/18 1051)  . piperacillin-tazobactam (ZOSYN)  IV 2.25 g (08/30/2018 0558)  . prismasol BGK 4/2.5 2,000 mL/hr at 2018-08-30 0737   alteplase, fentaNYL, heparin, midazolam, oxyCODONE, sodium chloride, sodium chloride flush  Iron/TIBC/Ferritin/ %Sat    Component Value Date/Time   IRON 20 (L) 06/08/2018 0504   TIBC 230 (L) 06/08/2018 0504   FERRITIN 6,485 (H) 08/05/2018 1515   IRONPCTSAT 9 (L) 06/08/2018 1497    Exam:  Patient not examined directly as recommended given COVID-19 pending or + status, utilizing exam of the primary team CCM.   Home meds:  - gabapentin  300 hs/ carbamazepine 200 bid  - calc acetate ac tid/ cinacalcet 50 hs  - carvedilol  25 bid/ clonidine 0.3 tid/ hydralazine 100 tid    [facility] MWF  4h  450/1.5   82kg   2/2.5 bath  LUA AVF   Hep none - mircera 200 every 2 wks - calcitriol 3 ug tiw - getting to dry wt   Assessment/ Plan: 1. Cardiac arrest - arrest x 4 yest and once this am. Lytes stable and pH stable post arrest.  2. Severe metabolic acidosis - on admission, lactic acidosis, improved overnight w/ CRRT but coded again this am.  3. Resp failure - intubated, ground glass changes on CT chest, CXR no acute findings.  4. ESRD on HD - last HD was 3/30 as OP, had CRRT last night. Labs OK. Will hold on RRT for now, BP's in 60-70's w/ recurrent arrest and no urgent need now for RRT. Have d/w primary team.  5. HTN - long hx , on 3 BP meds at home 6. H/o CHF - in past hx of systolic CHF but most recent EF in Feb '20 was wnl.      [facility] 08/30/18, 11:44 AM  Recent Labs  Lab 08/31/2018 0923  08/07/2018 2100  31-Aug-2018 0321 Aug 31, 2018 1007  NA 145   < > 141   < > 138 137  K 3.6   < > 3.9   < > 4.2 3.8  CL 95*   < > 97*  --  97*  --   CO2 12*   < > 17*  --  23  --   GLUCOSE 34*   < > 171*  --  109*  --   BUN 30*   < > 31*  --  24*  --   CREATININE 11.83*   < > 11.62*  --  7.79*  --   CALCIUM 9.3   < > 7.8*  --  7.9*  --   PHOS  --   --   --   --  3.6  --   ALBUMIN 4.1  --   --   --  3.3*  --    < > = values in this interval not displayed.   Recent Labs  Lab 08/17/2018 0923  AST 68*  ALT 34  ALKPHOS 91  BILITOT 2.1*  PROT 8.6*   Recent Labs  Lab 08/16/2018 0923  08-31-18 0321 08/31/18 1007  WBC 18.6*  --  19.8*  --   NEUTROABS 14.9*  --   --   --   HGB 8.8*   < > 8.0* 9.2*  HCT 33.8*   < > 28.1* 27.0*  MCV 83.3  --  78.1*  --   PLT 371  --  156  --    < > = values in this interval not displayed.

## 2018-09-02 NOTE — Death Summary Note (Signed)
DEATH SUMMARY   Patient Details  Name: Cameron [name] MRN: [id number] DOB: 05/16/1970  Admission/Discharge Information   Admit Date:  08-18-2018  Date of Death: Date of Death: 08-19-18  Time of Death: Time of Death: 60  Length of Stay: 1  Referring Physician: Corinne Ports, MD   Reason(s) for Hospitalization  PEA arrest  Diagnoses  Preliminary cause of death:   Pulseless electrical activity cardiac arrest Secondary Diagnoses (including complications and co-morbidities):  Active Problems:   Cardiac arrest [facility])   Stage 5 chronic kidney disease on chronic dialysis (HCC)   Hypotension   Metabolic acidosis   Brief Hospital Course (including significant findings, care, treatment, and services provided and events leading to death)  48 yo male with ESRD history who called the HD center with a febrile illness a few days before.  Admitted 08-18-22 initially w/ nausea/vomiting hypoglycemia and hypertension w/ BP 233/177. PEA arrest witnessed x 4 in ER.  Patient continued to arrest in the ICU for a total of 7 times.  After the 7th time we communicated with family who agreed to DNR and shortly after the patient arrested again and was pronounced.  Pertinent Labs and Studies  Significant Diagnostic Studies Dg Chest Port 1 View  Result Date: Aug 19, 2018 CLINICAL DATA:  Acute respiratory failure. EXAM: PORTABLE CHEST 1 VIEW COMPARISON:  Yesterday FINDINGS: Chronic cardiomegaly. Low volume chest with retrocardiac airspace opacity reflecting atelectasis by CT yesterday. No Kerley lines, effusion, or pneumothorax. Endotracheal tube tip between the clavicular heads and carina. The orogastric tube tip and side-port reaches the stomach. Right IJ line with tip at the SVC. IMPRESSION: 1. Stable hardware positioning. 2. Unchanged cardiomegaly and retrocardiac atelectasis. Electronically Signed   By: Monte Fantasia M.D.   On: 08/19/2018 06:21   Dg Chest Port 1 View  Result Date: 08-18-18 CLINICAL DATA:   Central venous catheter placement. EXAM: PORTABLE CHEST 1 VIEW COMPARISON:  Radiograph of 08/18/2018. FINDINGS: Stable cardiomegaly. Endotracheal and nasogastric tubes are unchanged in position. Interval placement of right internal jugular catheter with distal tip in expected position of the SVC. No pneumothorax or pleural effusion is noted. Stable probable minimal bibasilar subsegmental atelectasis. Bony thorax is unremarkable. IMPRESSION: Interval placement of right internal jugular catheter line with distal tip in expected position of the SVC. No pneumothorax is noted. Otherwise stable support apparatus. Probable minimal bibasilar subsegmental atelectasis. Electronically Signed   By: Marijo Conception, M.D.   On: 08-18-18 19:15   Dg Chest Port 1 View  Result Date: 08-18-18 CLINICAL DATA:  48 year old male status post intubation and gastric tube placement. EXAM: PORTABLE CHEST 1 VIEW COMPARISON:  Prior chest x-ray obtained earlier today at 9:16 a.m. FINDINGS: The patient is intubated. The tip of the endotracheal tube is 3.3 cm above the carina. A gastric tube is present. The tip of the tube lies inferior to the diaphragm, off the field of view and presumably within the stomach. External defibrillator pad projects over the chest. Relatively low inspiratory volumes with streaky bilateral perihilar and lower lobe airspace opacities. No significant peripheral opacities, pulmonary edema or pneumothorax. No pleural effusion. IMPRESSION: 1. The tip of the endotracheal tube is 3.3 cm above the carina. 2. The tip of the gastric tube lies off the field of view, below the diaphragm and likely within the stomach. 3. Stable cardiomegaly. 4. Perihilar and bibasilar streaky airspace opacities favored to reflect atelectasis. Electronically Signed   By: Jacqulynn Cadet M.D.   On: 08-18-2018 15:58  Dg Chest Port 1 View  Result Date: 08/31/2018 CLINICAL DATA:  Shortness of breath EXAM: PORTABLE CHEST 1 VIEW COMPARISON:   06/09/2018 FINDINGS: Cardiomegaly. No confluent opacities, effusions or edema. No acute bony abnormality. IMPRESSION: Cardiomegaly.  No active disease. Electronically Signed   By: Rolm Baptise M.D.   On: 09/01/2018 09:34   Ct Angio Chest/abd/pel For Dissection W And/or Wo Contrast  Result Date: 08/29/2018 CLINICAL DATA:  48 year old presenting with acute onset chest pain radiating into the back. Evaluate for aortic dissection. Current history of end-stage renal disease on hemodialysis. Current history of CHF. It is documented that the patient had cardiorespiratory arrest during the CT examination. EXAM: CT ANGIOGRAPHY CHEST, ABDOMEN AND PELVIS TECHNIQUE: Initially, multidetector CT through the chest was performed prior to IV contrast administration. Subsequently, multidetector CT imaging through the chest, abdomen and pelvis was performed using the standard protocol during bolus administration of intravenous contrast. Multiplanar reconstructed images and MIPs were obtained and reviewed to evaluate the vascular anatomy. CONTRAST:  139mL OMNIPAQUE IOHEXOL 350 MG/ML IV. COMPARISON:  CTA chest for pulmonary embolism 06/08/2018. No prior abdominopelvic CT. FINDINGS: CTA CHEST FINDINGS Cardiovascular: No evidence of mural hematoma on the unenhanced images. Gas is present in the independent portion of the RIGHT atrium related to air admitted via the peripheral IV. No evidence of thoracic aortic dissection. Moderate atherosclerosis involving the thoracic aorta without evidence of aneurysm. Atherosclerosis at the origin of the great vessels without evidence of hemodynamically significant stenosis. Bovine aortic arch anatomy (LEFT common carotid artery arises from the innominate artery). Heart markedly enlarged with RIGHT atrial enlargement in particular. LEFT ventricular hypertrophy and RIGHT ventricular hypertrophy. Moderate to severe three-vessel coronary atherosclerosis. No pericardial effusion. Reflux of contrast  into the IVC and hepatic veins. Mediastinum/Nodes: No pathologically enlarged mediastinal, hilar or axillary lymph nodes. No mediastinal masses. Normal-appearing esophagus. Asymmetric enlargement of the LEFT lobe of the thyroid gland containing multiple small subcentimeter nodules. Lungs/Pleura: Endotracheal tube tip approximately 2 cm above the carina. Respiratory motion blurs the images throughout. Atelectasis involving the lower lobes. Hazy ground-glass opacities in the perihilar location of both lungs. No pleural effusions. Musculoskeletal: Minimal thoracic spondylosis. Other: Mild BILATERAL gynecomastia. Review of the MIP images confirms the above findings. CTA ABDOMEN AND PELVIS FINDINGS VASCULAR Aorta: Moderate atherosclerosis without evidence of aneurysm or dissection. Celiac: Atherosclerotic though widely patent. SMA: Atherosclerotic though widely patent. Renals: Single renal arteries bilaterally demonstrating atherosclerosis but no visible hemodynamically significant stenoses. IMA: Atherosclerotic with diminished opacification indicating a significant origin stenosis. Inflow: Severe iliofemoral atherosclerosis bilaterally. Possible hemodynamically significant stenosis involving the LEFT common femoral artery. Veins: Not evaluated. Review of the MIP images confirms the above findings. NON-VASCULAR Hepatobiliary: Mild hepatomegaly. No focal hepatic parenchymal abnormality. Borderline to mild gallbladder wall thickening without evidence of calcified gallstones. Pancreas: Normal in appearance without evidence of mass, ductal dilation, or inflammation. Spleen: Normal in size and appearance. Adrenals/Urinary Tract: Normal appearing adrenal glands. Diminished opacification of the kidneys indicating chronic renal failure. No focal parenchymal abnormality involving either kidney. No hydronephrosis. No urinary tract calculi. Completely decompressed urinary bladder which I believe accounts for the apparent wall  thickening. Stomach/Bowel: Stomach normal in appearance for the degree of distention. Normal-appearing small bowel. Entire colon decompressed which I believe accounts for the apparent diffuse wall thickening. Normal appendix in the RIGHT UPPER pelvis. Lymphatic: No pathologic lymphadenopathy. Reproductive: Prostate gland and seminal vesicles normal in size and appearance for age. Other: Hemorrhage/ecchymosis/small hematoma in the RIGHT femoral region, likely related to the  central venous catheter placement. The RIGHT femoral central venous catheter tip is in the RIGHT common iliac vein. Small LEFT inguinal hernia containing fat. Musculoskeletal: Regional skeleton unremarkable without acute or significant osseous abnormality. Review of the MIP images confirms the above findings. IMPRESSION: 1. No evidence of thoracic or abdominal aortic aneurysm or dissection. 2. Cardiomegaly with right atrial enlargement, left ventricular hypertrophy and right ventricular hypertrophy. Moderate to severe three-vessel coronary atherosclerosis. 3. Reflux of contrast into the IVC and hepatic veins indicating right heart failure and/or tricuspid valve disease. 4. Atelectasis involving the lower lobes. Mild perihilar ground-glass opacities in both lungs likely representing mild pulmonary edema. 5. No acute abnormalities involving the abdomen or pelvis. 6. Hepatomegaly without focal hepatic parenchymal abnormality. 7. Borderline to mild gallbladder wall thickening without evidence of calcified gallstones. This may be due to hepatic dysfunction but can be seen in chronic cholecystitis. 8. Possible hemodynamically significant stenosis involving the left common femoral artery. Does the patient have claudication?. 9. Small LEFT inguinal hernia containing fat. 10. Hemorrhage/ecchymosis/small hematoma in the RIGHT femoral region, likely related to the central venous catheter placement. The tip of the RIGHT femoral catheter is in the RIGHT common  iliac vein. 11. Endotracheal tube tip approximately 2 cm above the carina. Aortic Atherosclerosis (ICD10-170.0) Electronically Signed   By: Evangeline Dakin M.D.   On: 08/06/2018 13:41    Microbiology No results found for this or any previous visit (from the past 240 hour(s)).  Lab Basic Metabolic Panel: No results for input(s): NA, K, CL, CO2, GLUCOSE, BUN, CREATININE, CALCIUM, MG, PHOS in the last 168 hours. Liver Function Tests: No results for input(s): AST, ALT, ALKPHOS, BILITOT, PROT, ALBUMIN in the last 168 hours. No results for input(s): LIPASE, AMYLASE in the last 168 hours. No results for input(s): AMMONIA in the last 168 hours. CBC: No results for input(s): WBC, NEUTROABS, HGB, HCT, MCV, PLT in the last 168 hours. Cardiac Enzymes: No results for input(s): CKTOTAL, CKMB, CKMBINDEX, TROPONINI in the last 168 hours. Sepsis Labs: No results for input(s): PROCALCITON, WBC, LATICACIDVEN in the last 168 hours.  Procedures/Operations     Benjamin Casanas 08/19/2018, 10:44 AM

## 2018-09-02 NOTE — Consult Note (Signed)
   [facility] CM Inpatient Consult   Aug 21, 2018  Ledarrius Beauchaine 07-Mar-1971 [id number]   Patient screened for extreme high risk score [30%] and hospitalizations with Medicare ACOPatient was hospitalized for Cardiac Arrest.  Patient is currently on the ventilator. Follow for disposition and needs as appropriate.    Please place a [facility] Care Management consult or for questions contact:   Natividad Brood, RN BSN [facility] Liaison  [phone] business mobile phone Toll free office [phone]

## 2018-09-02 NOTE — Progress Notes (Signed)
Pt 0810 BS 43, 0915 BS 25, hypoglycemic protocol carried out 58mls  D50 IV given. Pt coded during follow up BS. 0953 BS 257.  1318 BS 16, D50 8mls IV given 1350 BS 119.

## 2018-09-02 NOTE — Progress Notes (Signed)
CRITICAL VALUE ALERT  Critical Value:  Lactic 8.6  Date & Time Notied:  0020 08/26/2018  Provider Notified: elink provider  Orders Received/Actions taken: will continue to monitor

## 2018-09-02 NOTE — Progress Notes (Addendum)
NAME:  Cameron [name], MRN:  [id number], DOB:  14-Mar-1971, LOS: 1 ADMISSION DATE:  08/06/2018, CONSULTATION DATE:  4/1 REFERRING MD: Pl;unkett , CHIEF COMPLAINT:  Cardiac arrest    Brief History   esrd 47yom. Admitted 4/1 initially w/ nausea/vomiting hypoglycemia and hypertension w/ BP 233/177. PEA arrest witnessed x 4 in ER. Admitted to PCCM service  * apparently called Dialysis center on 27th w/ fever 102  History of present illness   48 year old male who presented acutely from HD center on 4/1 w/ cc: shortness of breath, x 24 hrs & HTN (arrived around 9am). No prodrome of cough, congestion or fever. Weight actually down 3 lbs from dry weight. No chest pain or pain, no N or V initially. In ER BP 233/177.  Became progressively hypoxic. Required NIPPV and ntg gtt (reported he had not taken his meds).  Complaining of new back pain and also reporting nausea and vomiting for 2 days. . Started on antihypertensives. 1035 wbc ct 18K, lactate > 11. Blood sugar was 29, got D50. Was given labetolol for on-going htn in spite of ntg gtt. 1215: hypotensive. Was getting fluid bolus. Reported abd hurt, then PEA arrest. S/p 1 round of CPR had ROSC.  Was subsequently intubated, PEA arrest X 2  More times. Each time ROSC after 1 round. Coded again in CT scan (4th time) 08/05/2018 PEA arrested while having work.  Performed.  Required epi x2.  Calcium 1 g x 1 D50 x1.  Return of spontaneous circulation.  Placed on Levophed drip 30 proved refractory had epinephrine drip started maintaining systolic blood pressure greater than 107.  This is the fourth time he is pulses electrical activity and chances of survival are dismal.  We will continue to monitor and treat at this time.   Past Medical History  ESRD M/W/F, non-compliant, recent admit w/ Hypertensive emergency and febrile illness (felt PNA and treated), discharged 2/3 HTN [facility]   4/1 admitted w/ c/o nausea and vomiting, hypoglycemia, htn and  SOB. Initially treated as hypertensive emergency and hypoglycemia. WOB worse. C/o abd pain. Cardiac arrest (PEA X 4) each w/ ROSC after 1 amp bicarb.   Consults:    Procedures:  oett 4/1>>> Femoral line 4/1>>> Right IJ hemodialysis catheter 08/31/2018>> Right radial A-line 08/03/2018>>  Significant Diagnostic Tests:   1. No evidence of thoracic or abdominal aortic aneurysm or Dissection. 2. Cardiomegaly with right atrial enlargement, left ventricular hypertrophy and right ventricular hypertrophy. Moderate to severe three-vessel coronary atherosclerosis. 3. Reflux of contrast into the IVC and hepatic veins indicating right heart failure and/or tricuspid valve disease. 4. Atelectasis involving the lower lobes. Mild perihilar ground-glass opacities in both lungs likely representing mild pulmonary edema. 5. No acute abnormalities involving the abdomen or pelvis. 6. Hepatomegaly without focal hepatic parenchymal abnormality. 7. Borderline to mild gallbladder wall thickening without evidence of calcified gallstones. This may be due to hepatic dysfunction but can be seen in chronic cholecystitis. 8. Possible hemodynamically significant stenosis involving the left common femoral artery. Does the patient have claudication?. 9. Small LEFT inguinal hernia containing fat. 10. Hemorrhage/ecchymosis/small hematoma in the RIGHT femoral region, likely related to the central venous catheter placement. The tip of the RIGHT femoral catheter is in the RIGHT common iliac vein. 11. Endotracheal tube tip approximately 2 cm above the carina.   Micro Data:  bcx 2 w 4/1>>> Sputum 4/1>>> UC 4/1>>> rvp 4/1>>> Influenza 4/1>>> covid 4/1>>> Antimicrobials:  vanc 4/1>>> Zosyn  4/1>>> Hydroxychloroquine 4/1>>>  Interim history/subjective:  42,020 0 940 developed PEA arrest while having rectal tube performed.  Required epinephrine x2, calcium 1 g, D50 x1, chest compressions for approximately 90 seconds  with return of spontaneous circulation.  Required increasing doses of Levophed up to 40 therefore epinephrine was initiated at 5. Objective   Blood pressure (!) 121/96, pulse 90, temperature (!) 96.3 F (35.7 C), temperature source Axillary, resp. rate (!) 30, height 5\' 4"  (1.626 m), weight 84 kg, SpO2 100 %. CVP:  [6 mmHg-17 mmHg] 17 mmHg  Vent Mode: PRVC FiO2 (%):  [40 %-100 %] 60 % Set Rate:  [15 bmp-30 bmp] 30 bmp Vt Set:  [470 mL] 470 mL PEEP:  [5 cmH20] 5 cmH20 Plateau Pressure:  [25 cmH20-28 cmH20] 26 cmH20   Intake/Output Summary (Last 24 hours) at Aug 26, 2018 0912 Last data filed at 08/26/18 0800 Gross per 24 hour  Intake 2288.47 ml  Output 1268 ml  Net 1020.47 ml   Filed Weights   2018-08-26 0427  Weight: 84 kg    Examination: General: Middle-aged male is currently heavily sedated on full mechanical ventilatory support HEENT: Tracheal tube in place gastric tube in place Neuro: Withdraws to noxious stimuli CV: Heart sounds are distant, currently on epinephrine at 8 and Levophed at 40 PULM: even/non-labored, lungs bilaterally coarse rhonchi    MB:TDHR, non-tender, bsx4 active  Extremities: warm/dry, negative edema, left bicep AV graft patent with bruit and thrill, right radial A-line in place.  Right femoral CVL in place. Skin: no rashes or lesions   Resolved Hospital Problem list    Assessment & Plan:   PEA arrest X 4 w/ circulatory shock ? Cardiogenic ? Sepsis  -responding to 1 round epi. Seems as though acidosis is major contributing factor ? Transient Gut ischemia  ?primary cardiac event ? Sepsis did have fever Friday but opted not to go to be evaluated Plan ICU admit Currently off vasopressor Off bicarbonate drip Monitor cardiac enzymes Culture data Continue empirical pneumonia coverage with Zosyn Being tested for COVID-19 Lactulose has been discontinued on 08/26/2018 secondary to cardiac dysrhythmias. Order drug screen for completeness 26-Aug-2018 Status post  PEA arrest while on the unit on 08/05/2018 0 940 with return of spontaneous circulation after epinephrine D50 calcium and chest compressions.  He has reached maximal hospital benefit and his prognosis is poor.  Acute hypoxic respiratory failure seemingly exacerbated by profound acidosis and shock Plan Full vent support Acidosis been corrected on 2018-08-26 Serial chest x-rays and ABGs as needed Acute metabolic encephalopathy: 2/2 sevre metabolic derangements.  Plan Neurochecks No hypothermia at this time Normothermia May need CT of the head near future   Lactic acidosis s/po cardiac arrest and shock state Plan Monitor lactic acid  Monitor bicarb level 08/26/18 currently alkalotic pH 7.304 bicarb  ESRD Plan Nephrology consult 42,020 currently on CRRT  Diabetes w/ hypoglycemia CBG (last 3)  Recent Labs    08/22/2018 1449 2018-08-26 0204 Aug 26, 2018 0359  GLUCAP 119* 120* 104*    Plan Sliding-scale insulin protocol May need dextrose drip for hypoglycemia  Anemia w/out evidence of bleeding Recent Labs    08/26/18 0252 08-26-2018 0321  HGB 9.9* 8.0*    Plan Monitoring for bleeding Issues per protocol   Best practice:  Diet: NPO 08/04/1998 and start tube feedings Pain/Anxiety/Delirium protocol (if indicated): 4/1 note on 4 2 he is changed to OxyIR 10 mg every 6 as needed and placed on Precedex drip due to national shortage of fentanyl VAP protocol (if indicated): 4/1 DVT  prophylaxis: [place] heparin GI prophylaxis: PPI Glucose control: serial cbg Mobility: br Code Status: full code  Family Communication: 2018/09/01 no family at bedside Disposition: critically ill. Etiology unclear for arrest. ? Primary cardiac event ? Transient gut ischemia. ? Infection/sepsis. Requires icu care for freq titration of vent/rr and fio2, titration of pressors and interp of metabolic derangements  08/05/100: Significant other Osie Bond arterial flow numbers without success at 1018 hrs.  Labs     CBC: Recent Labs  Lab 08/11/2018 0923 09/01/2018 1135 08/15/2018 1526 01-Sep-2018 0252 09/01/2018 0321  WBC 18.6*  --   --   --  19.8*  NEUTROABS 14.9*  --   --   --   --   HGB 8.8* 10.2* 9.2* 9.9* 8.0*  HCT 33.8* 30.0* 27.0* 29.0* 28.1*  MCV 83.3  --   --   --  78.1*  PLT 371  --   --   --  725    Basic Metabolic Panel: Recent Labs  Lab 08/21/2018 0923  08/31/2018 1515 08/16/2018 1526 08/27/2018 2100 Sep 01, 2018 0252 2018-09-01 0321  NA 145   < > 143 137 141 141 138  K 3.6   < > 4.7 5.5* 3.9 4.3 4.2  CL 95*  --  106  --  97*  --  97*  CO2 12*  --  7*  --  17*  --  23  GLUCOSE 34*  --  150*  --  171*  --  109*  BUN 30*  --  24*  --  31*  --  24*  CREATININE 11.83*  --  9.52*  --  11.62*  --  7.79*  CALCIUM 9.3  --  6.2*  --  7.8*  --  7.9*  PHOS  --   --   --   --   --   --  3.6   < > = values in this interval not displayed.   GFR: Estimated Creatinine Clearance: 11.7 mL/min (A) (by C-G formula based on SCr of 7.79 mg/dL (H)). Recent Labs  Lab 08/30/2018 0923  08/25/2018 1515 08/09/2018 1815 08/22/2018 2100 08/16/2018 2339 09/01/2018 0157 09-01-18 0321  PROCALCITON  --   --  3.60  --   --   --   --   --   WBC 18.6*  --   --   --   --   --   --  19.8*  LATICACIDVEN >11.0*   < > >11.0* >11* >11.0* 8.6* 7.3*  --    < > = values in this interval not displayed.    Liver Function Tests: Recent Labs  Lab 08/12/2018 0923 01-Sep-2018 0321  AST 68*  --   ALT 34  --   ALKPHOS 91  --   BILITOT 2.1*  --   PROT 8.6*  --   ALBUMIN 4.1 3.3*   Recent Labs  Lab 08/07/2018 1515  LIPASE 34  AMYLASE 94   No results for input(s): AMMONIA in the last 168 hours.  ABG    Component Value Date/Time   PHART 7.535 (H) 09-01-2018 0252   PCO2ART 35.3 09-01-2018 0252   PO2ART 314.0 (H) 09/01/18 0252   HCO3 29.9 (H) 2018/09/01 0252   TCO2 31 2018/09/01 0252   ACIDBASEDEF 17.0 (H) 08/29/2018 1526   O2SAT 100.0 09/01/18 0252     Coagulation Profile: No results for input(s): INR, PROTIME in the last 168  hours.  Cardiac Enzymes: Recent Labs  Lab 08/31/2018 0923  TROPONINI 0.08*  HbA1C: No results found for: HGBA1C  CBG: Recent Labs  Lab 08/06/2018 1209 08/07/2018 1218 08/10/2018 1449 2018-08-25 0204 2018/08/25 0359  GLUCAP 66* 122* 119* 120* 104*      Critical care time: 35 min      Steve Minor ACNP [facility] P[facility] Pager [phone] till 1 pm If no answer page [phone] 08-25-2018, 9:12 AM  Attending Note:  48 year old male with ESRD who presents to PCCM with respiratory failure after a cardiac arrest in the dialysis unit.  Patient is in refractory shock and arrested x2 this AM on an epi drip.  On exam, he is unresponsive with pupils being fixed and dilated.  I reviewed CXR myself, infiltrate noted and ETT is in a good position.  Concerned that patient will not survive this.  I attempted to call family with no response.  Awaiting COVID testing.  Will likely arrest again this AM.  The patient is critically ill with multiple organ systems failure and requires high complexity decision making for assessment and support, frequent evaluation and titration of therapies, application of advanced monitoring technologies and extensive interpretation of multiple databases.   Critical Care Time devoted to patient care services described in this note is  63  Minutes. This time reflects time of care of this signee Dr Jennet Maduro. This critical care time does not reflect procedure time, or teaching time or supervisory time of PA/NP/Med student/Med Resident etc but could involve care discussion time.  Rush Farmer, M.D. [facility] Pulmonary/Critical Care Medicine. Pager: [phone]. After hours pager: [phone].

## 2018-09-02 NOTE — Procedures (Signed)
CPR Note  CPR for PEA, ACLS protocol followed, see code sheet.  Rush Farmer, M.D. [facility] Pulmonary/Critical Care Medicine. Pager: [phone]. After hours pager: [phone].

## 2018-09-02 DEATH — deceased

## 2019-08-11 IMAGING — CT CT ANGIO CHEST-ABD-PELV FOR DISSECTION W/ AND WO/W CM
2 of 10 series · 12 of 46 positions shown, 14 images · IV contrast (omnipaque)
Comparison: CTA chest for pulmonary embolism 06/08/2018. No prior
abdominopelvic CT.

CLINICAL DATA: 47-year-old presenting with acute onset chest pain
radiating into the back. Evaluate for aortic dissection. Current
history of end-stage renal disease on hemodialysis. Current history
of CHF.

It is documented that the patient had cardiorespiratory arrest
during the CT examination.
EXAM:
CT ANGIOGRAPHY CHEST, ABDOMEN AND PELVIS
TECHNIQUE: Initially, multidetector CT through the chest was performed prior to
IV contrast administration. Subsequently, multidetector CT imaging
through the chest, abdomen and pelvis was performed using the
standard protocol during bolus administration of intravenous
contrast. Multiplanar reconstructed images and MIPs were obtained
and reviewed to evaluate the vascular anatomy.
CONTRAST:  180mL OMNIPAQUE IOHEXOL 350 MG/ML IV.

[Series 604: dissection 1.0 i30f 3 · axial · 0.91mm/px · z∈[+798,+1344]mm · 9 of 651 slices shown, 11 images]
[im 69/651  soft-tissue]
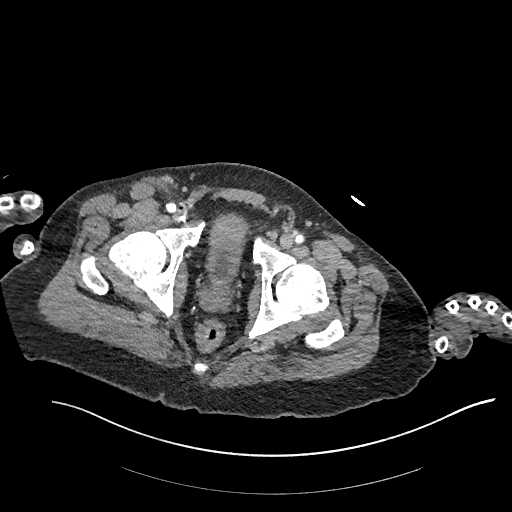
[im 69/651  bone]
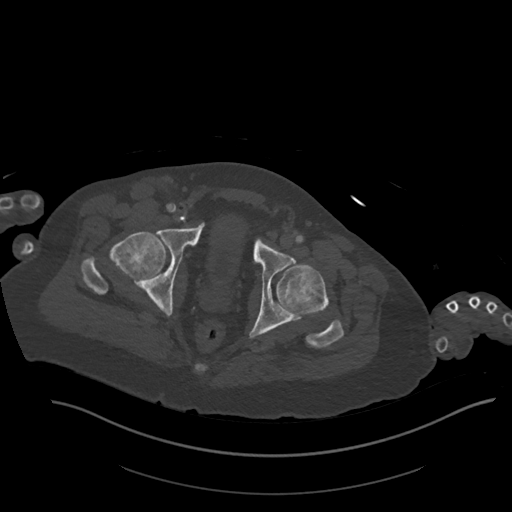
[im 137/651  soft-tissue]
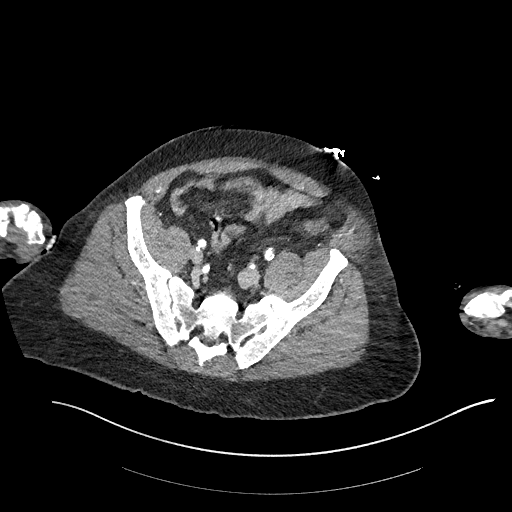
[im 206/651  soft-tissue]
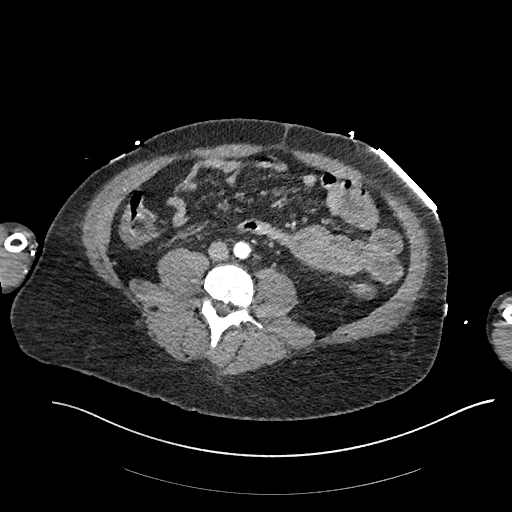
[im 274/651  soft-tissue]
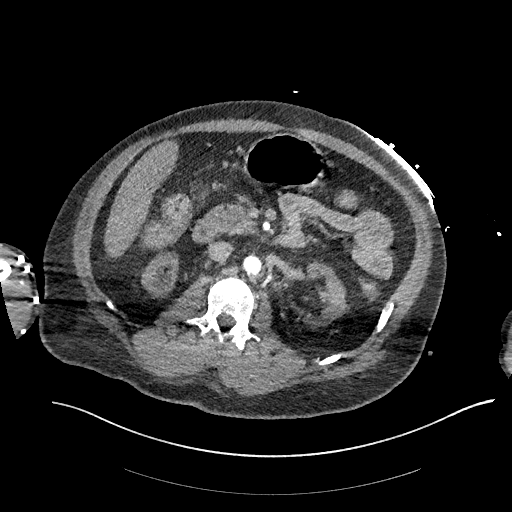
[im 343/651  soft-tissue]
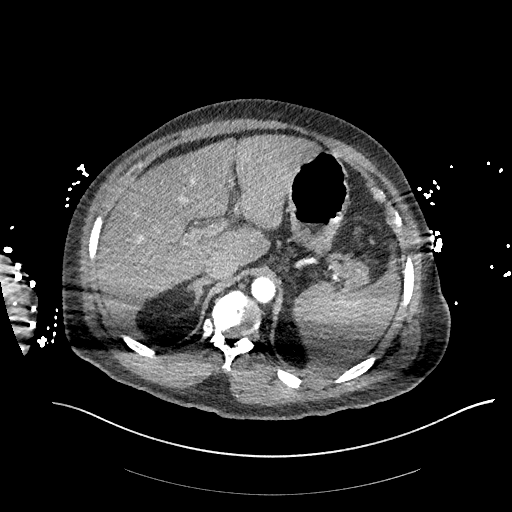
[im 411/651  soft-tissue]
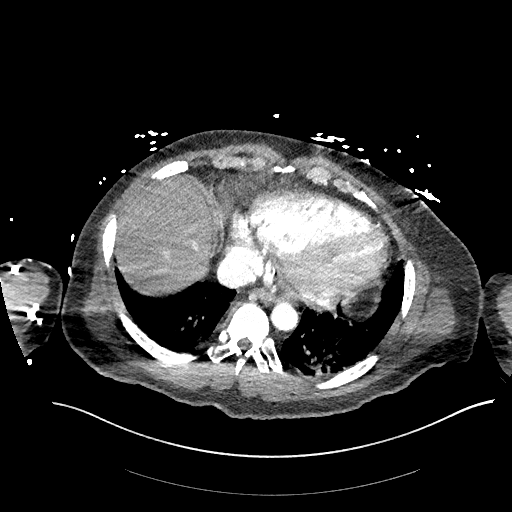
[im 479/651  soft-tissue]
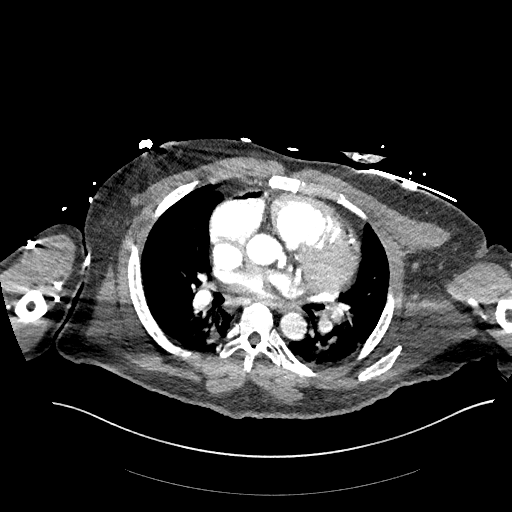
[im 548/651  soft-tissue]
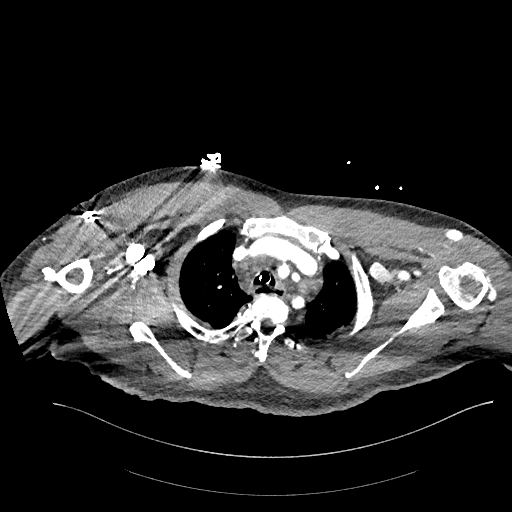
[im 616/651  soft-tissue]
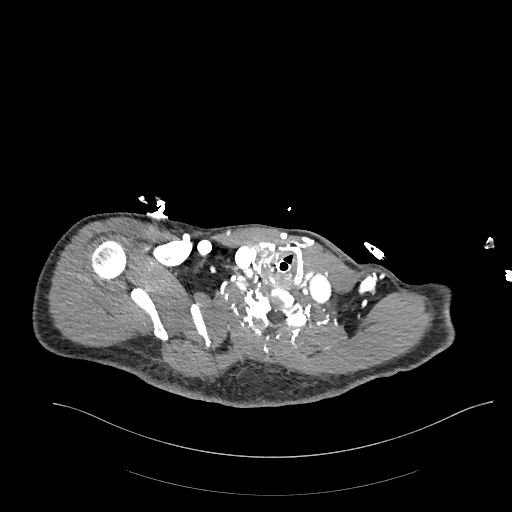
[im 616/651  bone]
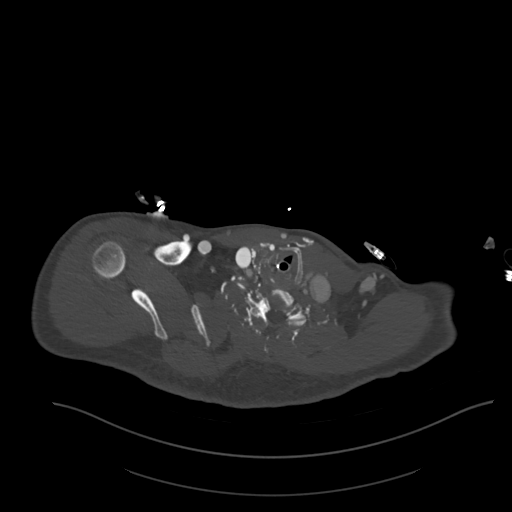

[Series 605: coronals · coronal · 0.76mm/px · 3 of 150 slices shown]
[im 38/150  soft-tissue]
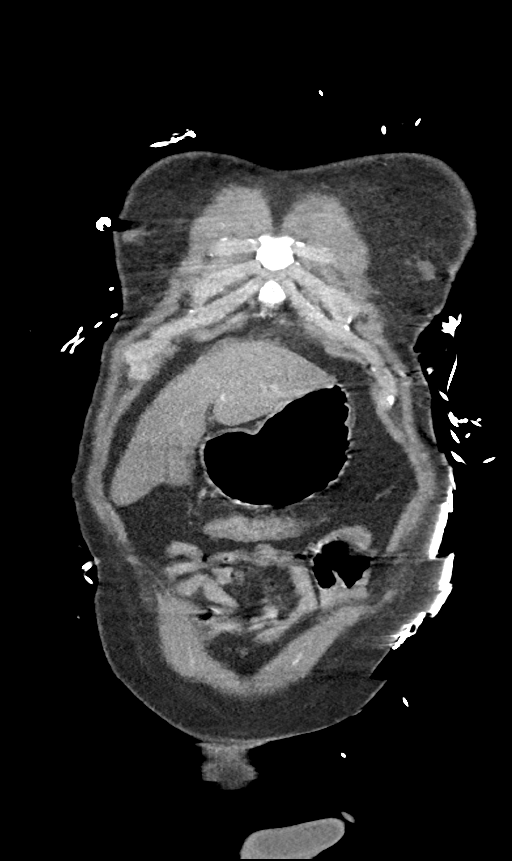
[im 75/150  soft-tissue]
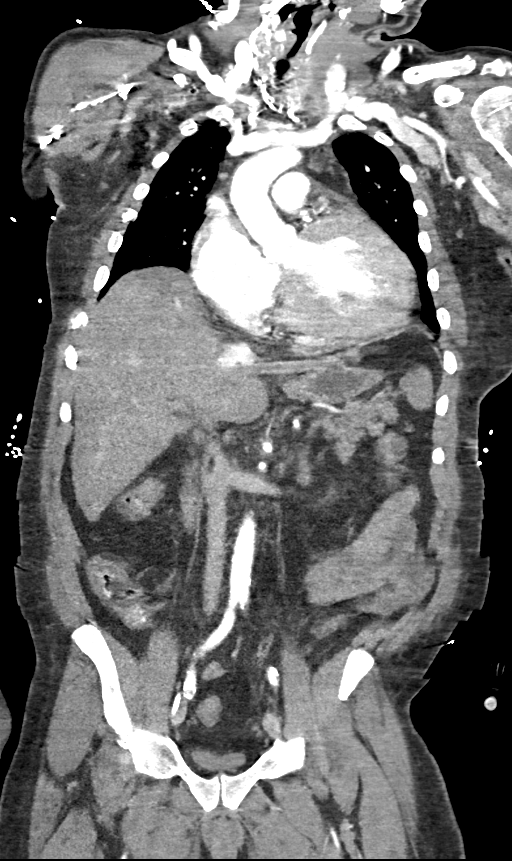
[im 112/150  soft-tissue]
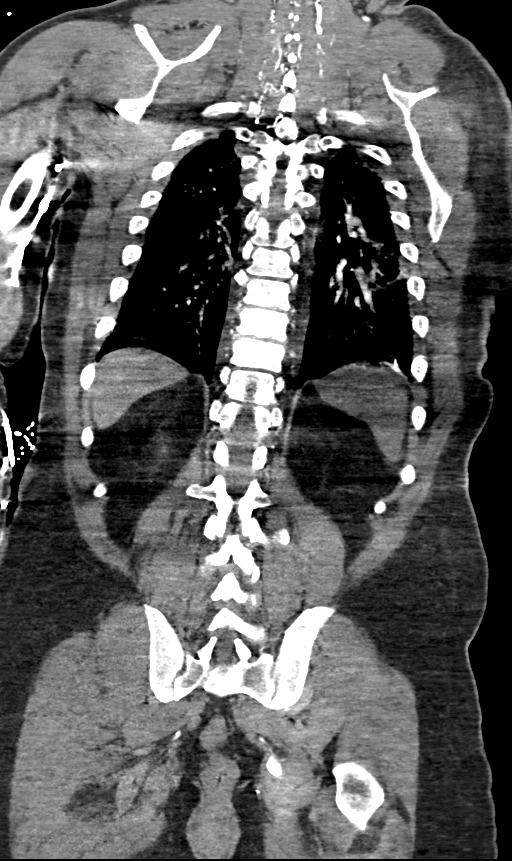

[12 of 46 positions shown; findings below may reference images not displayed]

FINDINGS: CTA CHEST FINDINGS

Cardiovascular: No evidence of mural hematoma on the unenhanced
images. Gas is present in the independent portion of the RIGHT
atrium related to air admitted via the peripheral IV.

No evidence of thoracic aortic dissection. Moderate atherosclerosis
involving the thoracic aorta without evidence of aneurysm.
Atherosclerosis at the origin of the great vessels without evidence
of hemodynamically significant stenosis. Bovine aortic arch anatomy
(LEFT common carotid artery arises from the innominate artery).

Heart markedly enlarged with RIGHT atrial enlargement in particular.
LEFT ventricular hypertrophy and RIGHT ventricular hypertrophy.
Moderate to severe three-vessel coronary atherosclerosis. No
pericardial effusion. Reflux of contrast into the IVC and hepatic
veins.

Mediastinum/Nodes: No pathologically enlarged mediastinal, hilar or
axillary lymph nodes. No mediastinal masses. Normal-appearing
esophagus. Asymmetric enlargement of the LEFT lobe of the thyroid
gland containing multiple small subcentimeter nodules.

Lungs/Pleura: Endotracheal tube tip approximately 2 cm above the
carina. Respiratory motion blurs the images throughout. Atelectasis
involving the lower lobes. Hazy ground-glass opacities in the
perihilar location of both lungs. No pleural effusions.

Musculoskeletal: Minimal thoracic spondylosis.

Other: Mild BILATERAL gynecomastia.

Review of the MIP images confirms the above findings.

CTA ABDOMEN AND PELVIS FINDINGS

VASCULAR

Aorta: Moderate atherosclerosis without evidence of aneurysm or
dissection.

Celiac: Atherosclerotic though widely patent.

SMA: Atherosclerotic though widely patent.

Renals: Single renal arteries bilaterally demonstrating
atherosclerosis but no visible hemodynamically significant stenoses.

IMA: Atherosclerotic with diminished opacification indicating a
significant origin stenosis.

Inflow: Severe iliofemoral atherosclerosis bilaterally. Possible
hemodynamically significant stenosis involving the LEFT common
femoral artery.

Veins: Not evaluated.

Review of the MIP images confirms the above findings.

NON-VASCULAR

Hepatobiliary: Mild hepatomegaly. No focal hepatic parenchymal
abnormality. Borderline to mild gallbladder wall thickening without
evidence of calcified gallstones.

Pancreas: Normal in appearance without evidence of mass, ductal
dilation, or inflammation.

Spleen: Normal in size and appearance.

Adrenals/Urinary Tract: Normal appearing adrenal glands. Diminished
opacification of the kidneys indicating chronic renal failure. No
focal parenchymal abnormality involving either kidney. No
hydronephrosis. No urinary tract calculi. Completely decompressed
urinary bladder which I believe accounts for the apparent wall
thickening.

Stomach/Bowel: Stomach normal in appearance for the degree of
distention. Normal-appearing small bowel. Entire colon decompressed
which I believe accounts for the apparent diffuse wall thickening.
Normal appendix in the RIGHT UPPER pelvis.

Lymphatic: No pathologic lymphadenopathy.

Reproductive: Prostate gland and seminal vesicles normal in size and
appearance for age.

Other: Hemorrhage/ecchymosis/small hematoma in the RIGHT femoral
region, likely related to the central venous catheter placement. The
RIGHT femoral central venous catheter tip is in the RIGHT common
iliac vein.

Small LEFT inguinal hernia containing fat.

Musculoskeletal: Regional skeleton unremarkable without acute or
significant osseous abnormality.

Review of the MIP images confirms the above findings.
IMPRESSION: 1. No evidence of thoracic or abdominal aortic aneurysm or
dissection.
2. Cardiomegaly with right atrial enlargement, left ventricular
hypertrophy and right ventricular hypertrophy. Moderate to severe
three-vessel coronary atherosclerosis.
3. Reflux of contrast into the IVC and hepatic veins indicating
right heart failure and/or tricuspid valve disease.
4. Atelectasis involving the lower lobes. Mild perihilar
ground-glass opacities in both lungs likely representing mild
pulmonary edema.
5. No acute abnormalities involving the abdomen or pelvis.
6. Hepatomegaly without focal hepatic parenchymal abnormality.
7. Borderline to mild gallbladder wall thickening without evidence
of calcified gallstones. This may be due to hepatic dysfunction but
can be seen in chronic cholecystitis.
8. Possible hemodynamically significant stenosis involving the left
common femoral artery. Does the patient have claudication?.
9. Small LEFT inguinal hernia containing fat.
10. Hemorrhage/ecchymosis/small hematoma in the RIGHT femoral
region, likely related to the central venous catheter placement. The
tip of the RIGHT femoral catheter is in the RIGHT common iliac vein.
11. Endotracheal tube tip approximately 2 cm above the carina.

Aortic Atherosclerosis ([id number])

## 2019-08-11 IMAGING — DX PORTABLE CHEST - 1 VIEW
1 series · 1 of 1 positions shown · non-contrast
Comparison: Radiograph August 03, 2018.

CLINICAL DATA: Central venous catheter placement.

EXAM:
PORTABLE CHEST 1 VIEW

[chest]
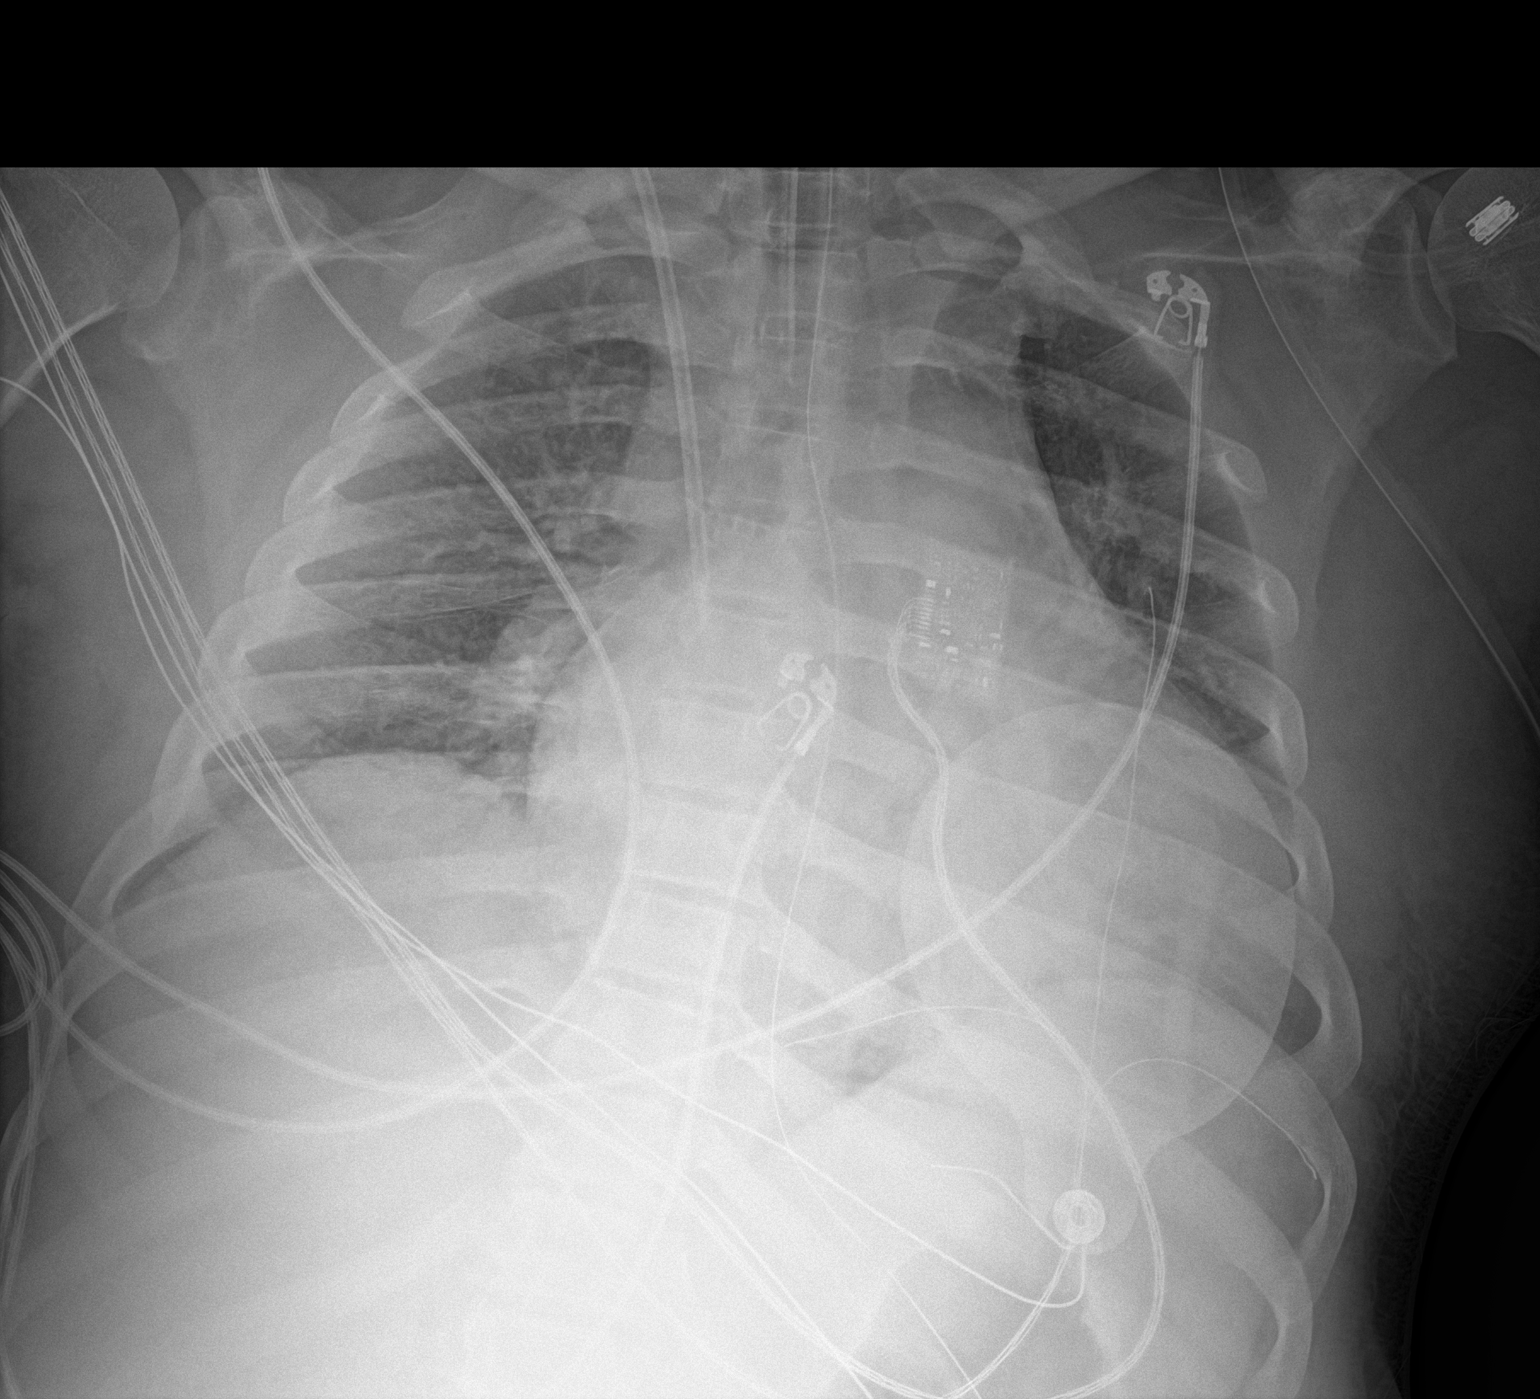

[1 of 1 positions shown; findings below may reference images not displayed]

FINDINGS: Stable cardiomegaly. Endotracheal and nasogastric tubes are
unchanged in position. Interval placement of right internal jugular
catheter with distal tip in expected position of the SVC. No
pneumothorax or pleural effusion is noted. Stable probable minimal
bibasilar subsegmental atelectasis. Bony thorax is unremarkable.
IMPRESSION: Interval placement of right internal jugular catheter line with
distal tip in expected position of the SVC. No pneumothorax is
noted. Otherwise stable support apparatus. Probable minimal
bibasilar subsegmental atelectasis.

## 2019-08-11 IMAGING — DX PORTABLE CHEST - 1 VIEW
1 series · 1 of 1 positions shown · non-contrast
Comparison: 06/09/2018

CLINICAL DATA: Shortness of breath

EXAM:
PORTABLE CHEST 1 VIEW

[chest]
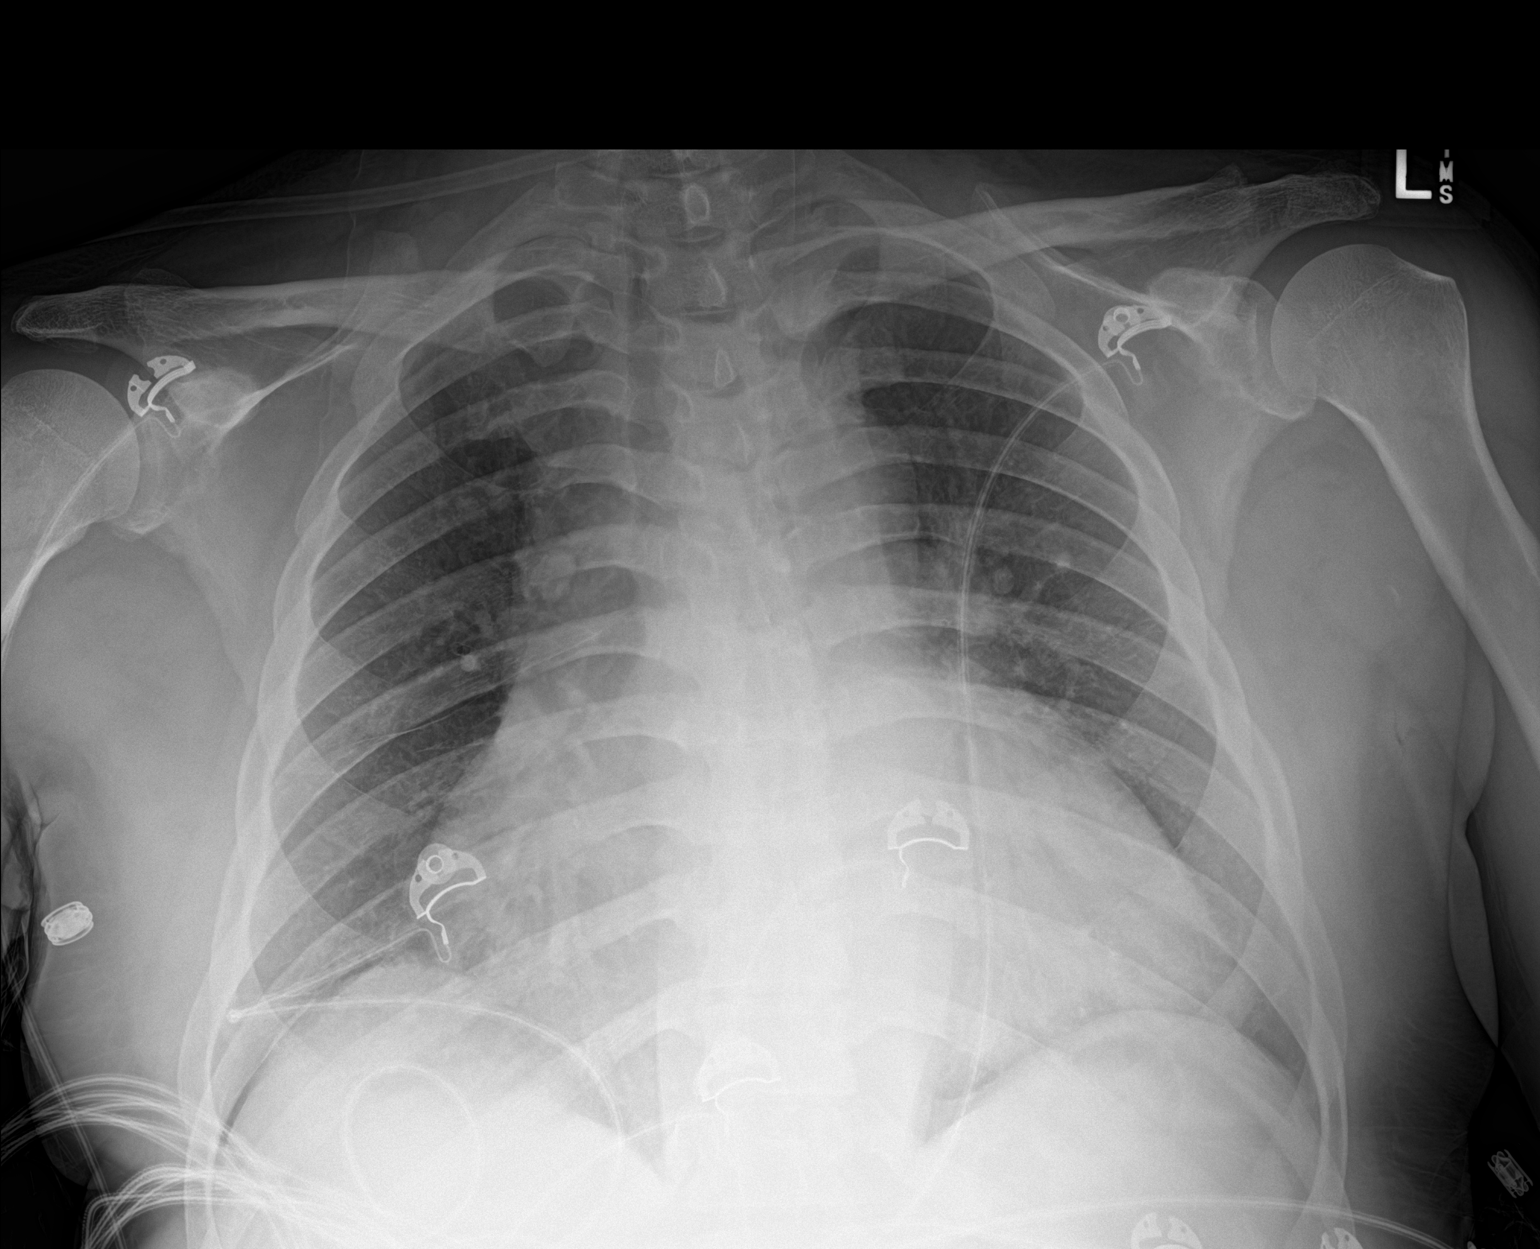

[1 of 1 positions shown; findings below may reference images not displayed]

FINDINGS: Cardiomegaly. No confluent opacities, effusions or edema. No acute
bony abnormality.
IMPRESSION: Cardiomegaly.  No active disease.

## 2019-08-11 IMAGING — DX PORTABLE CHEST - 1 VIEW
1 series · 1 of 1 positions shown · non-contrast
Comparison: Prior chest x-ray obtained earlier today at [DATE] a.m.

CLINICAL DATA: 47-year-old male status post intubation and gastric
tube placement.

EXAM:
PORTABLE CHEST 1 VIEW

[chest ap]
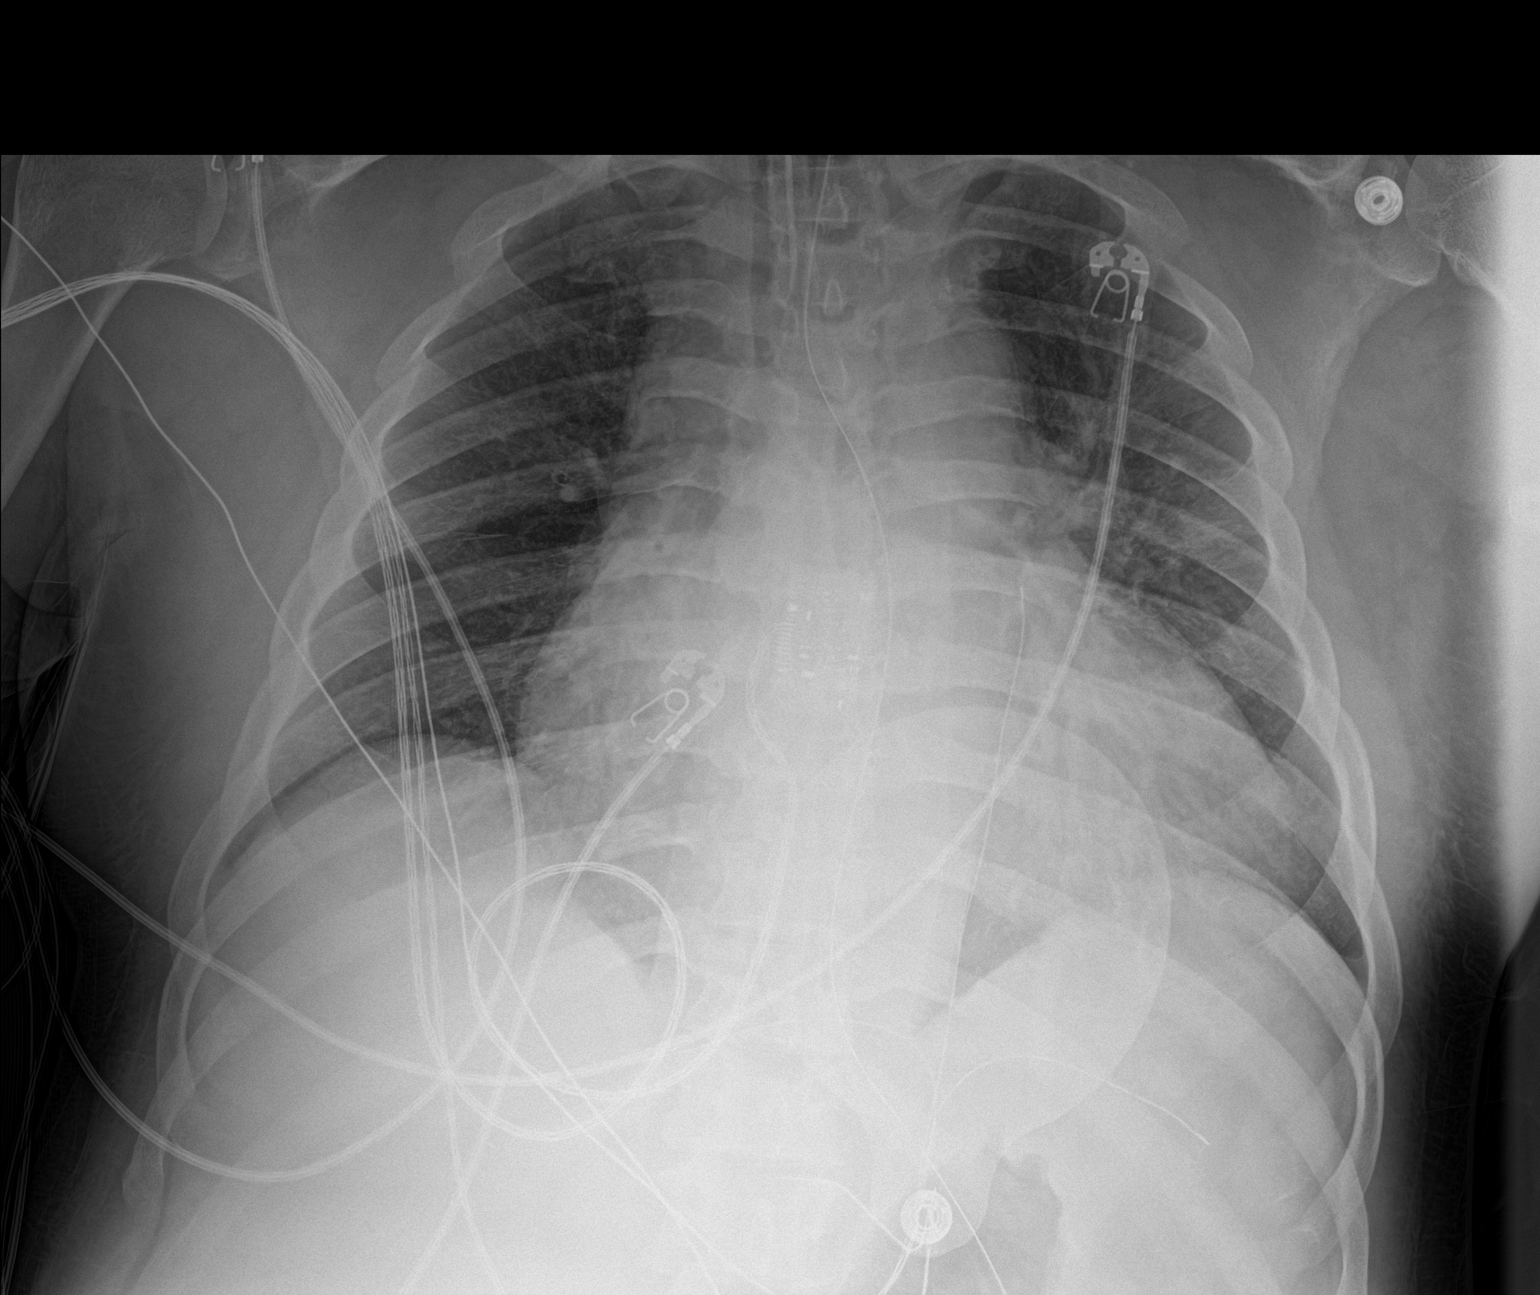

[1 of 1 positions shown; findings below may reference images not displayed]

FINDINGS: The patient is intubated. The tip of the endotracheal tube is 3.3 cm
above the carina. A gastric tube is present. The tip of the tube
lies inferior to the diaphragm, off the field of view and presumably
within the stomach. External defibrillator pad projects over the
chest. Relatively low inspiratory volumes with streaky bilateral
perihilar and lower lobe airspace opacities. No significant
peripheral opacities, pulmonary edema or pneumothorax. No pleural
effusion.
IMPRESSION: 1. The tip of the endotracheal tube is 3.3 cm above the carina.
2. The tip of the gastric tube lies off the field of view, below the
diaphragm and likely within the stomach.
3. Stable cardiomegaly.
4. Perihilar and bibasilar streaky airspace opacities favored to
reflect atelectasis.

## 2019-08-12 IMAGING — DX PORTABLE CHEST - 1 VIEW
1 series · 1 of 1 positions shown · non-contrast
Comparison: Yesterday

CLINICAL DATA: Acute respiratory failure.

EXAM:
PORTABLE CHEST 1 VIEW

[chest]
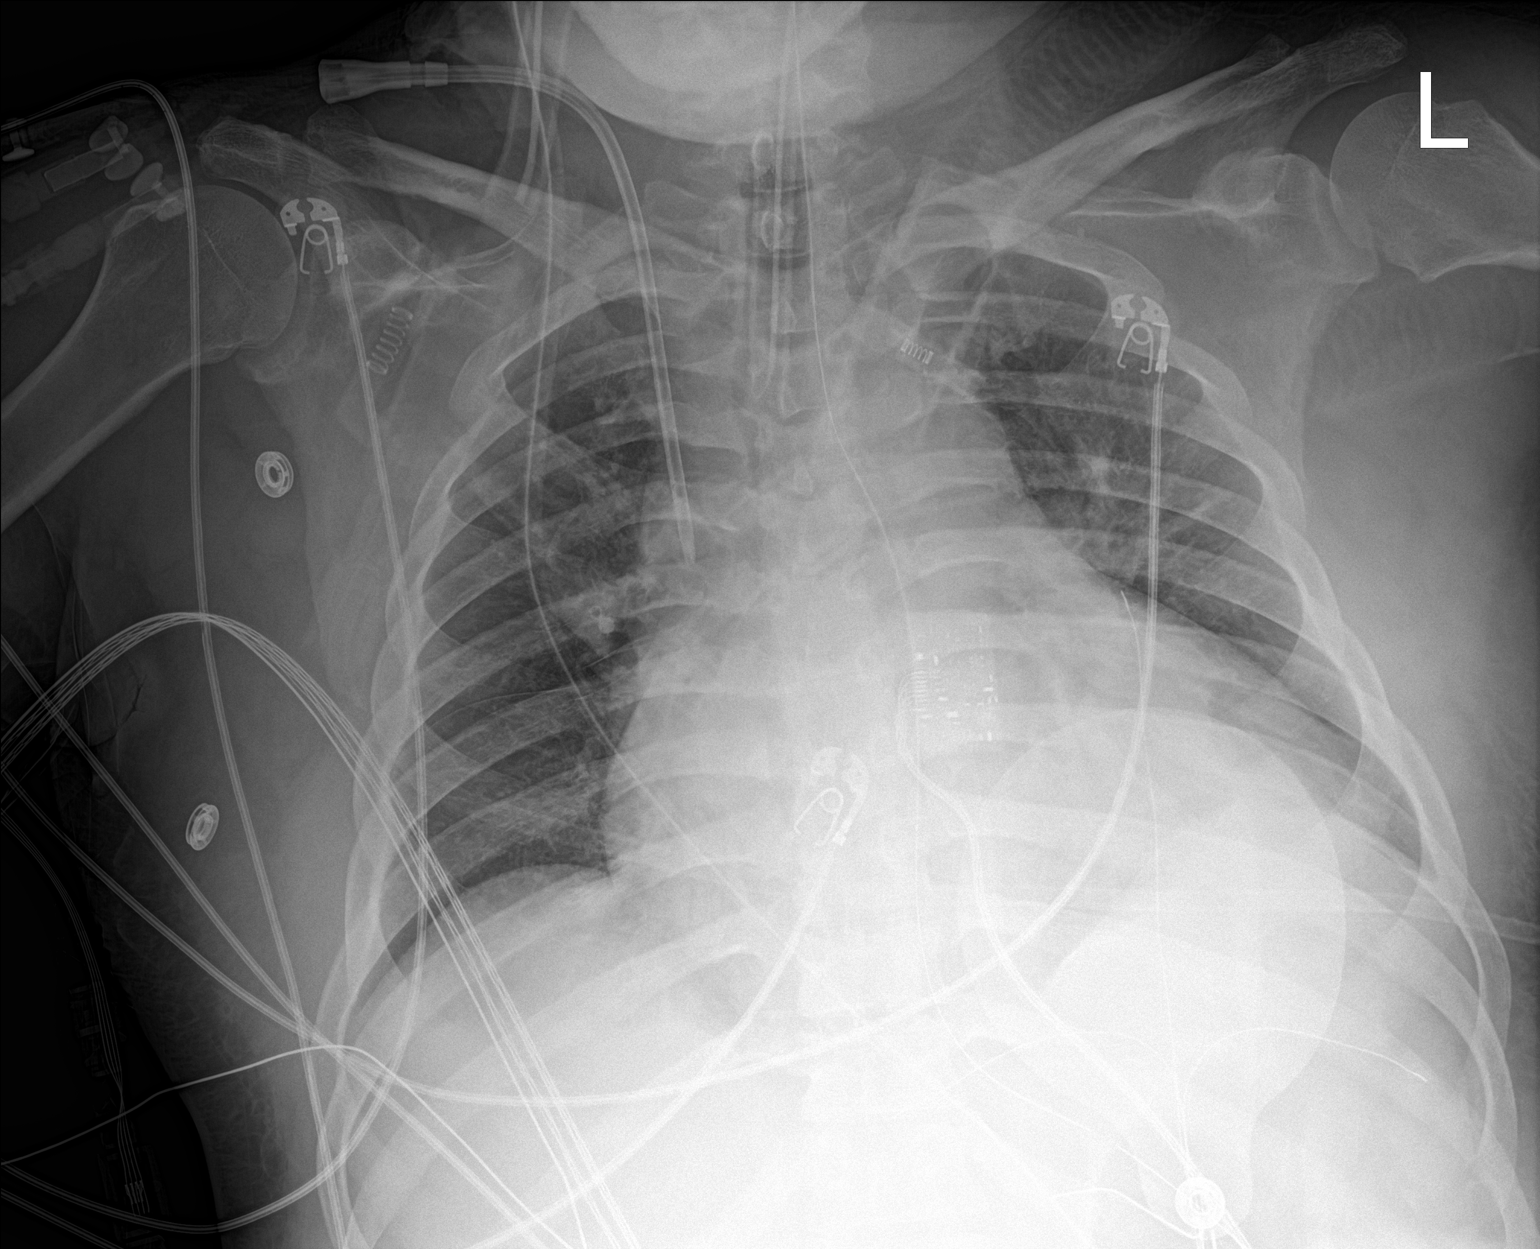

[1 of 1 positions shown; findings below may reference images not displayed]

FINDINGS: Chronic cardiomegaly. Low volume chest with retrocardiac airspace
opacity reflecting atelectasis by CT yesterday. No Kerley lines,
effusion, or pneumothorax. Endotracheal tube tip between the
clavicular heads and carina. The orogastric tube tip and side-port
reaches the stomach. Right IJ line with tip at the SVC.
IMPRESSION: 1. Stable hardware positioning.
2. Unchanged cardiomegaly and retrocardiac atelectasis.
